# Patient Record
Sex: Female | Born: 1975 | Race: White | Hispanic: No | Marital: Married | Smoking: Never smoker
Health system: Southern US, Community
[De-identification: ages and names within clinical notes are randomized; demographics above are authoritative.]

## PROBLEM LIST (undated history)

## (undated) DIAGNOSIS — E559 Vitamin D deficiency, unspecified: Secondary | ICD-10-CM

## (undated) DIAGNOSIS — D649 Anemia, unspecified: Secondary | ICD-10-CM

## (undated) DIAGNOSIS — Q7962 Hypermobile Ehlers-Danlos syndrome: Secondary | ICD-10-CM

## (undated) DIAGNOSIS — M1990 Unspecified osteoarthritis, unspecified site: Secondary | ICD-10-CM

## (undated) DIAGNOSIS — K3184 Gastroparesis: Secondary | ICD-10-CM

## (undated) DIAGNOSIS — R55 Syncope and collapse: Secondary | ICD-10-CM

## (undated) DIAGNOSIS — S82202K Unspecified fracture of shaft of left tibia, subsequent encounter for closed fracture with nonunion: Secondary | ICD-10-CM

## (undated) DIAGNOSIS — G43909 Migraine, unspecified, not intractable, without status migrainosus: Secondary | ICD-10-CM

## (undated) HISTORY — PX: ESOPHAGOGASTRODUODENOSCOPY ENDOSCOPY: SHX5814

## (undated) HISTORY — PX: CHOLECYSTECTOMY: SHX55

## (undated) HISTORY — PX: KNEE SURGERY: SHX244

## (undated) HISTORY — PX: TONSILLECTOMY: SUR1361

## (undated) HISTORY — PX: SHOULDER SURGERY: SHX246

## (undated) HISTORY — PX: MAXILLARY ANTROSTOMY: SHX2003

---

## 2001-09-16 ENCOUNTER — Ambulatory Visit (HOSPITAL_COMMUNITY): Admission: RE | Admit: 2001-09-16 | Discharge: 2001-09-16 | Payer: Self-pay | Admitting: *Deleted

## 2005-03-02 ENCOUNTER — Other Ambulatory Visit: Admission: RE | Admit: 2005-03-02 | Discharge: 2005-03-02 | Payer: Self-pay | Admitting: Obstetrics and Gynecology

## 2005-05-01 ENCOUNTER — Encounter: Admission: RE | Admit: 2005-05-01 | Discharge: 2005-05-01 | Payer: Self-pay | Admitting: Orthopedic Surgery

## 2007-02-26 ENCOUNTER — Encounter: Admission: RE | Admit: 2007-02-26 | Discharge: 2007-02-26 | Payer: Self-pay | Admitting: Emergency Medicine

## 2007-03-01 ENCOUNTER — Ambulatory Visit: Payer: Self-pay | Admitting: Pulmonary Disease

## 2007-07-18 ENCOUNTER — Encounter: Admission: RE | Admit: 2007-07-18 | Discharge: 2007-07-18 | Payer: Self-pay | Admitting: Orthopedic Surgery

## 2007-09-24 ENCOUNTER — Ambulatory Visit: Payer: Self-pay | Admitting: Pulmonary Disease

## 2007-09-24 DIAGNOSIS — G43909 Migraine, unspecified, not intractable, without status migrainosus: Secondary | ICD-10-CM | POA: Insufficient documentation

## 2007-09-24 DIAGNOSIS — R0602 Shortness of breath: Secondary | ICD-10-CM | POA: Insufficient documentation

## 2007-09-24 DIAGNOSIS — J383 Other diseases of vocal cords: Secondary | ICD-10-CM | POA: Insufficient documentation

## 2007-09-24 DIAGNOSIS — F410 Panic disorder [episodic paroxysmal anxiety] without agoraphobia: Secondary | ICD-10-CM | POA: Insufficient documentation

## 2007-09-25 ENCOUNTER — Telehealth (INDEPENDENT_AMBULATORY_CARE_PROVIDER_SITE_OTHER): Payer: Self-pay | Admitting: *Deleted

## 2010-04-26 NOTE — Assessment & Plan Note (Signed)
Summary: asthma/apc      Visit Type:  Initial Consult Referred by:  Dr. Laurine Blazer @ [facility]  Chief Complaint:  ? asthma; shortness of breath .  History of Present Illness: Referred for evaluation of 'asthma' - she reports dyspnea that started 6/09 while walking back from court. This primarily manifests as inability to take a deep breath. It occurs on humid days & is improved by changing her environment (into [facility]) or drinking either very hot or very cold liquids. She describes panic attacks once /yr for the last 10 yrs that cause dyspnea & tremors but her current symptoms are different. She denies any symptoms of heartburn or worsening with certain foods.There is no childhood history of asthma or persistent wheezing. h/o post bronchitic cough 12/08   I do note from her medical record that she had a chronic cough, that lasted from 05/2001 to 07/2001 when she was evaluated by Dr Sherene Sires. Drixoral was tried then. A sinus CT scan was negative in 05/2001.She was aggressively treated for laryngopharyngeal reflux. On spirometry, FEV1 was 3.86 and a methacholine challenge test in 08/2001 did not identify any broncho spastic lung disease. She did not tolerate placement of a 24 hour esophageal pH probe. Angela Harrison reports that cough died down spontaneously after a while. Allergy testing demonstrated hypersensitivity to mold and weed.      Updated Prior Medication List: SKELAXIN 800 MG  TABS (METAXALONE) One tablet every 6 hours as needed VICODIN 5-500 MG  TABS (HYDROCODONE-ACETAMINOPHEN) One tablet every 4-6 hours as needed TREXIMET 85-500 MG  TABS (SUMATRIPTAN-NAPROXEN SODIUM) Use as directed for migraines.  Current Allergies: ! ANCEF ! KEFLEX ! CEPHALEXIN  Past Medical History:    migraines    panic attacks  Past Surgical History:    4 kneee surgeries    3 L shoulder surgeries    jaw surgery - upper jaw surgery - to correct bite.    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  The patient denies anorexia, fever, weight loss, weight gain, vision loss, decreased hearing, hoarseness, chest pain, syncope, peripheral edema, prolonged cough, headaches, hemoptysis, abdominal pain, melena, hematochezia, severe indigestion/heartburn, hematuria, incontinence, genital sores, muscle weakness, suspicious skin lesions, transient blindness, difficulty walking, depression, unusual weight change, abnormal bleeding, enlarged lymph nodes, angioedema, breast masses, and testicular masses.     Vital Signs:  Patient Profile:   35 Years Old Female Height:     70 inches Weight:      196.50 pounds O2 Sat:      98 % O2 treatment:    Room Air Temp:     98.1 degrees F oral Pulse rate:   88 / minute BP sitting:   104 / 78  (left arm) Cuff size:   regular  Vitals Entered By: Marinus Maw (September 24, 2007 2:28 PM)             Comments Medications reviewed with patient Marinus Maw  September 24, 2007 2:36 PM      Physical Exam  General:     HEENT - no thrush, no post nasal drip No JVD, no lymphadenopathy CVS- s1s2 nml, no murmur RS- clear, no crackles or rhonchi Abd- soft, non-tender, no organomegaly CNS- non focal Ext - no edema  Nose:     clear nasal discharge.      Pulmonary Function Test Height (in.): 70 Gender: Female    Impression & Recommendations:  Problem # 1:  DYSPNEA (ICD-786.05) Spirometry >> nml lung function. Doubt asthma here, feel symptoms may be attributable to vocal  cord dysfunction. DD incl allergic diathesis (this seems to have been tested last yr) & post nasal drip causing intermittent cough. She will use proventil on a prn  basis . Given reading material for VCD- consider speech therapy referral if symptoms worsen. 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 Orders: Consultation Level III (72536) Spirometry w/Graph (94010)   Medications Added to Medication List This Visit: 1)  Skelaxin 800 Mg Tabs (Metaxalone) .... One tablet every 6 hours as needed 2)  Vicodin 5-500 Mg Tabs (Hydrocodone-acetaminophen) .... One tablet every 4-6 hours as needed 3)  Treximet 85-500 Mg Tabs (Sumatriptan-naproxen sodium) .... Use as directed for migraines.   Patient Instructions: 1)  Please schedule a follow-up appointment in 3 months. 2)  Your breathing test was normal. 3)  Some reading material about vocal cord dysfunction is provided - please go over the relaxation techniques & abdominal breathing  described. 4)  Use albuterol inhaler as needed   ]

## 2010-04-26 NOTE — Progress Notes (Signed)
----   Converted from flag ---- ---- 09/24/2007 8:27 PM, [place] Locket. Vassie Loll MD wrote: to dr Laurine Blazer. ------------------------------  Faxed to Dr. Laurine Blazer

## 2010-08-09 NOTE — Assessment & Plan Note (Signed)
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 About four weeks ago, she developed low  grade fevers, fatigue, and post-nasal drip. This was followed by a dry  hacking cough. She initially received Levaquin, a Z-Pak, and prednisone  tablets. She is now on Avelox and a course of Medrol that she started  three days ago. She received treatment with Duo-Nebs in the doctor's  office, but could not tell any difference. She states that her cough is  unchanged. It is worse at night when she is laying flat. There are no  specific precipitating or relieving factors. A chest x-ray on 02/26/2007  did not show any infiltrates or effusions.   She denies any symptoms of heartburn or worsening with certain foods.  There is no childhood history of asthma or persistent wheezing. I do  note from her medical record that she had similar symptoms, a similar  cough, that lasted from 05/2001 to 07/2001 when she was evaluated by Dr.  Sherene Sires. Drixoral was tried then. A sinus CT scan was negative in 05/2001.  She was aggressively treated for laryngopharyngeal reflux. On  spirometry, FEV1 was 3.86 and a methacholine challenge test in 08/2001  did not identify any broncho spastic lung disease. She did not tolerate  placement of a 24 hour esophageal pH probe. Alvie reports that cough  died down spontaneously after a while. Allergy testing demonstrated  hypersensitivity to mold and weed.   PAST MEDICAL HISTORY:  Chronic migraine headaches.   PAST SURGICAL HISTORY:  Four knee surgeries, shoulder surgeries for  dislocation, jaw surgeries.   ALLERGIES:  CEPHALOSPORINS.   CURRENT MEDICATIONS:  1. Tussionex nightly.  2. Imitrex p.r.n.  3. Skelaxin 800 mg t.i.d. p.r.n.  4. Vicodin 5/500 mg p.r.n.   SOCIAL HISTORY:  Never smoker. She drinks 1 to 2 glasses of wine per  week. She is single and has had no sick contacts. 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 She recently traveled to [place], [place]  and [place].   FAMILY HISTORY:  Father has high blood pressure. Mother and paternal  grandmother have breast cancer.   REVIEW OF SYSTEMS:  Non-productive cough followed by chest pain, right  earache, scant white phlegm that is occasionally yellow.   PHYSICAL EXAMINATION:  VITAL SIGNS:  Weight 200 pounds, temperature  98.1, blood pressure 122/76, heart rate 86 per minute, oxygen saturation  97% on room air.  HEENT:  Injected posterior pharyngeal wall. No exudates.  CARDIOVASCULAR:  S1, S2, normal.  CHEST:  Clear to auscultation. No rhonchi.  ABDOMEN:  Soft and nontender.  EXTREMITIES:  No edema.   IMPRESSION:  I feel that Aradhana most likely has a post-bronchitic  cough. I have reassured her and explained to her that this can last for  as  long as 8 to 12 weeks. Based on her prior such episode in 2003, I  would not be surprised if this even lasts longer and I think that our  objective should be to provide her symptomatic relief rather than pursue  further testing. I do note that aggressive therapy for reflux in the  past has not been of any benefit. I doubt asthma, especially in the  light of a negative methacholine challenge test in the past.   RECOMMENDATIONS:  1. For post-bronchitic cough, inhaled steroids have shown to be of      some benefit. Accordingly, I gave her a sample of Qvar 80 mcg MDI      and I asked her to take two puffs twice daily. In the absence of      bronchospasm, I did not feel that bronchodilator is necessary at      this time.  2. I also gave her a prescription for Atrovent because this has been      shown to be of some benefit, probably because of the      anticholinergic  effects. She will fill this prescription only if      she is symptomatic unimproved for a couple of weeks.  3. She will continue to use the Tussionex at night for symptomatic      relief and complete the course of prednisone that she is currently      on. If she is not improved, she will come for a follow up visit      again in about 4 to 6 weeks time.     Oretha Milch, MD  Electronically Signed    RVA/MedQ  DD: 03/01/2007  DT: 03/02/2007  Job #: [id number]   cc:   Oley Balm. Georgina Pillion, M.D.

## 2010-12-08 ENCOUNTER — Other Ambulatory Visit: Payer: Self-pay | Admitting: Gastroenterology

## 2010-12-08 DIAGNOSIS — R1013 Epigastric pain: Secondary | ICD-10-CM

## 2010-12-14 ENCOUNTER — Ambulatory Visit
Admission: RE | Admit: 2010-12-14 | Discharge: 2010-12-14 | Disposition: A | Discharge status: Home / Self Care | Payer: BC Managed Care – PPO | Source: Ambulatory Visit | Attending: Gastroenterology | Admitting: Gastroenterology

## 2010-12-14 DIAGNOSIS — R1013 Epigastric pain: Secondary | ICD-10-CM

## 2011-01-10 ENCOUNTER — Other Ambulatory Visit (HOSPITAL_COMMUNITY): Payer: Self-pay | Admitting: Gastroenterology

## 2011-01-12 ENCOUNTER — Encounter (HOSPITAL_COMMUNITY)
Admission: RE | Admit: 2011-01-12 | Discharge: 2011-01-12 | Disposition: A | Discharge status: Home / Self Care | Payer: BC Managed Care – PPO | Source: Ambulatory Visit | Attending: Gastroenterology | Admitting: Gastroenterology

## 2011-01-12 DIAGNOSIS — R112 Nausea with vomiting, unspecified: Secondary | ICD-10-CM | POA: Insufficient documentation

## 2011-01-12 MED ORDER — TECHNETIUM TC 99M SULFUR COLLOID
2.0000 | Freq: Once | INTRAVENOUS | Status: AC | PRN
  Administered 2011-01-12: 2 via ORAL

## 2011-05-25 ENCOUNTER — Emergency Department (HOSPITAL_COMMUNITY)
Admission: EM | Admit: 2011-05-25 | Discharge: 2011-05-26 | Disposition: A | Discharge status: Home Health | Payer: BC Managed Care – PPO | Attending: Emergency Medicine | Admitting: Emergency Medicine

## 2011-05-25 ENCOUNTER — Encounter (HOSPITAL_COMMUNITY): Payer: Self-pay | Admitting: Emergency Medicine

## 2011-05-25 DIAGNOSIS — R55 Syncope and collapse: Secondary | ICD-10-CM | POA: Insufficient documentation

## 2011-05-25 DIAGNOSIS — R42 Dizziness and giddiness: Secondary | ICD-10-CM | POA: Insufficient documentation

## 2011-05-25 DIAGNOSIS — E860 Dehydration: Secondary | ICD-10-CM | POA: Insufficient documentation

## 2011-05-25 DIAGNOSIS — R112 Nausea with vomiting, unspecified: Secondary | ICD-10-CM | POA: Insufficient documentation

## 2011-05-25 HISTORY — DX: Gastroparesis: K31.84

## 2011-05-25 NOTE — ED Notes (Signed)
Pt has had vomitng and seen at [facility] since August for the same complaints. Pt sts her sx are the same and she has vomited several times today.

## 2011-05-26 LAB — BASIC METABOLIC PANEL
BUN: 6 mg/dL (ref 6–23)
CO2: 26 mEq/L (ref 19–32)
Calcium: 9.4 mg/dL (ref 8.4–10.5)
Chloride: 101 mEq/L (ref 96–112)
Creatinine, Ser: 0.68 mg/dL (ref 0.50–1.10)
GFR calc Af Amer: >90 mL/min (ref >90)
GFR calc non Af Amer: >90 mL/min (ref >90)
Glucose, Bld: 84 mg/dL (ref 70–99)
Potassium: 3.8 mEq/L (ref 3.5–5.1)
Sodium: 138 mEq/L (ref 135–145)

## 2011-05-26 LAB — URINALYSIS, ROUTINE W REFLEX MICROSCOPIC
Bilirubin Urine: NEGATIVE
Glucose, UA: NEGATIVE mg/dL
Hgb urine dipstick: NEGATIVE
Ketones, ur: NEGATIVE mg/dL
Leukocytes, UA: NEGATIVE
Nitrite: NEGATIVE
Protein, ur: NEGATIVE mg/dL
Specific Gravity, Urine: 1.006 (ref 1.005–1.030)
Urobilinogen, UA: 0.2 mg/dL (ref 0.0–1.0)
pH: 6 (ref 5.0–8.0)

## 2011-05-26 LAB — CBC
HCT: 34 % — ABNORMAL LOW (ref 36.0–46.0)
Hemoglobin: 11.4 g/dL — ABNORMAL LOW (ref 12.0–15.0)
MCH: 26.8 pg (ref 26.0–34.0)
MCHC: 33.5 g/dL (ref 30.0–36.0)
MCV: 80 fL (ref 78.0–100.0)
Platelets: 324 10*3/uL (ref 150–400)
RBC: 4.25 MIL/uL (ref 3.87–5.11)
RDW: 14.2 % (ref 11.5–15.5)
WBC: 8 10*3/uL (ref 4.0–10.5)

## 2011-05-26 MED ORDER — PROMETHAZINE HCL 25 MG RE SUPP: 25.0000 mg | Freq: Four times a day (QID) | RECTAL | Status: DC | PRN

## 2011-05-26 MED ORDER — ONDANSETRON HCL 4 MG/2ML IJ SOLN
4.0000 mg | Freq: Once | INTRAMUSCULAR | Status: AC
  Administered 2011-05-26: 4 mg via INTRAVENOUS
  Filled 2011-05-26: qty 2

## 2011-05-26 MED ORDER — PROMETHAZINE HCL 25 MG PO TABS: 25.0000 mg | ORAL_TABLET | Freq: Four times a day (QID) | ORAL | Status: DC | PRN

## 2011-05-26 MED ORDER — SODIUM CHLORIDE 0.9 % IV BOLUS (SEPSIS)
1000.0000 mL | Freq: Once | INTRAVENOUS | Status: AC
  Administered 2011-05-26: 1000 mL via INTRAVENOUS

## 2011-05-26 NOTE — Discharge Instructions (Signed)
Return to the emergency department for severe or worsening symptoms, your blood work is normal in her symptoms have improved significantly with intravenous medications. Take Phenergan by mouth or by rectum should her symptoms return.  Dehydration Dehydration is the reduction of water and fluid from the body to a level below that required for proper functioning. CAUSES  Dehydration occurs when there is excessive fluid loss from the body or when loss of normal fluids is not adequately replaced.  Loss of fluids occurs in vomiting, diarrhea, excessive sweating, excessive urine output, or excessive loss of fluid from the lungs (as occurs in fever or in patients on a ventilator).   Inadequate fluid replacement occurs with nausea or decreased appetite due to illness, sore throat, or mouth pain.  SYMPTOMS  Mild dehydration  Thirst (infants and young children may not be able to tell you they are thirsty).   Dry lips.   Slightly dry mouth membranes.  Moderate dehydration  Very dry mouth membranes.   Sunken eyes.   Sunken soft spot (fontanelle) on infant's head.   Skin does not bounce back quickly when lightly pinched and released.   Decreased urine production.   Decreased tear production.  Severe dehydration  Rapid, weak pulse (more than 100 beats per minute at rest).   Cold hands and feet.   Loss of ability to sweat in spite of heat and temperature.   Rapid breathing.   Blue lips.   Confusion, lethargy, difficult to arouse.   Minimal urine production.   No tears.  DIAGNOSIS  Your caregiver will diagnose dehydration based on your symptoms and your exam. Blood and urine tests will help confirm the diagnosis. The diagnostic evaluation should also identify the cause of dehydration. PREVENTION  The body depends on a proper balance of fluid and salts (electrolytes) for normal function. Adequate fluid intake in the presence of illness or other stresses (such as extreme exercise) is  important.  TREATMENT   Mild dehydration is safe to self-treat for most ages as long as it does not worsen. Contact your caregiver for even mild dehydration in infants and the elderly.   In teenagers and adults with moderate dehydration, careful home treatment (as outlined below) can be safe. Phone contact with a caregiver is advised. Children under 44 years of age with moderate dehydration should see a caregiver.   If you or your child is severely dehydrated, go to a hospital for treatment. Intravenous (IV) fluids will quickly reverse dehydration and are often lifesaving in young children, infants, and elderly persons.  HOME CARE INSTRUCTIONS  Small amounts of fluids should be taken frequently. Large amounts at one time may not be tolerated. Plain water may be harmful in infants and the elderly. Oral rehydration solutions (ORS) are available at pharmacies and grocery stores. ORS replaces water and important electrolytes in proper proportions. Sports drinks are not as effective as ORS and may be harmful because the sugar can make diarrhea worse.  As a general guideline for children, replace any new fluid losses from diarrhea and/or vomiting with ORS as follows:   If your child weighs 22 pounds or under (10 kg or less), give 60-120 mL (1/4-1/2 cup or 2-4 ounces) of ORS for each diarrheal stool or vomiting episode.   If your child weighs more than 22 pounds (more than 10 kg), give 120-240 mL (1/2-1 cup or 4-8 ounces) of ORS for each diarrheal stool or vomiting episode.   If your child is vomiting, it may be helpful  to give the above ORS replacement in 5 mL (1 teaspoon) amounts every 5 minutes and increase as tolerated.   While correcting for dehydration, children should eat normally. However, foods high in sugar should be avoided because they may worsen diarrhea. Large amounts of carbonated soft drinks, juice, gelatin desserts, and other highly sugared drinks should be avoided.   After correction  of dehydration, other liquids that are appealing to the child may be added. Children should drink small amounts of fluids frequently and fluids should be increased as tolerated. Children should drink enough fluids to keep urine clear or pale yellow.   Adults should eat normally while drinking more fluids than usual. Drink small amounts of fluids frequently and increase the amount as tolerated. Drink enough fluids to keep urine clear or pale yellow. Broths, weak decaffeinated tea, lemon-lime soft drinks (allowed to go flat), and ORS replace fluids and electrolytes.   Avoid:   Carbonated drinks.   Juice.   Extremely hot or cold fluids.   Caffeine drinks.   Fatty, greasy foods.   Alcohol.   Tobacco.   Too much intake of anything at one time.   Gelatin desserts.   Probiotics are active cultures of beneficial bacteria. They may lessen the amount and number of diarrheal stools in adults. Probiotics can be found in yogurt with active cultures and in supplements.   Wash your hands well to avoid spreading germs (bacteria) and viruses.   Antidiarrheal medicines are not recommended for infants and children.   Only take over-the-counter or prescription medicines for pain, discomfort, or fever as directed by your caregiver. Do not give aspirin to children.   For adults with dehydration, ask your caregiver if you should continue all prescribed and over-the-counter medicines.   If your caregiver has given you a follow-up appointment, it is very important to keep that appointment. Not keeping the appointment could result in a lasting (chronic) or permanent injury and disability. If there is any problem keeping the appointment, you must call to reschedule.  SEEK IMMEDIATE MEDICAL CARE IF:   You are unable to keep fluids down or other symptoms become worse despite treatment.   Vomiting or diarrhea develops and becomes persistent.   There is vomiting of blood or green matter (bile).   There  is blood in the stool or the stools are black and tarry.   There is no urine output in 6 to 8 hours or there is only a small amount of very dark urine.   Abdominal pain develops, increases, or localizes.   You or your child has an oral temperature above 102 F (38.9 C), not controlled by medicine.   Your baby is older than 3 months with a rectal temperature of 102.10F (38.9 C) or higher.   Your baby is 70 months old or younger with a rectal temperature of 100.4 F (38 C) or higher.   You develop excessive weakness, dizziness, fainting, or extreme thirst.   You develop a rash, stiff neck, severe headache, or you become irritable, sleepy, or difficult to awaken.  MAKE SURE YOU:   Understand these instructions.   Will watch your condition.   Will get help right away if you are not doing well or get worse.  Document Released: 03/13/2005 Document Revised: 09/26/2010 Document Reviewed: 02/09/2009 [facility] Patient Information 2012 [place], [place].

## 2011-05-26 NOTE — ED Provider Notes (Signed)
History     CSN: [id number]  Arrival date & time 05/25/11  2156   First MD Initiated Contact with Patient 05/25/11 2358      Chief Complaint  Patient presents with  . Dehydration    (Consider location/radiation/quality/duration/timing/severity/associated sxs/prior treatment) HPI Comments: 36 year old female with a history of recent diagnosis of unexplained gastroparesis. She presents because she's been having several days of worsening nausea and vomiting. This is persistent, nothing makes it better or worse, it is not associated with abdominal pain, chest pain, shortness of breath or fevers. She denies changes in bowel habits, diarrhea, rectal bleeding, dysuria. In the last several days she finished her menstrual cycle. The nausea and vomiting is persistent throughout the day, she has had half of a bagel and one piece of toast and vomited both promptly. She has been able to tolerate some fluids but has noticed that her urine has become gradually darker as the day has gone on. In the last several days she has recently started taking metoclopramide for nausea. Her workup has been performed at [facility] stay in [place]. She has not had any surgical procedures on her abdomen  Prior to arrival, the patient noted that from standing she became dizzy lightheaded and had a near syncopal episode in her vision went black. This resolved after 30 seconds and improved with sitting down.  The history is provided by the patient.    Past Medical History  Diagnosis Date  . Gastric paresis     No past surgical history on file.  No family history on file.  History  Substance Use Topics  . Smoking status: Not on file  . Smokeless tobacco: Not on file  . Alcohol Use: No    OB History    Grav Para Term Preterm Abortions TAB SAB Ect Mult Living                  Review of Systems  All other systems reviewed and are negative.    Allergies  Cefazolin and Cephalexin  Home  Medications   Current Outpatient Rx  Name Route Sig Dispense Refill  . HYDROCODONE-ACETAMINOPHEN 5-500 MG PO TABS Oral Take 1 tablet by mouth every 8 (eight) hours as needed. For pain    . METOCLOPRAMIDE HCL 10 MG PO TABS Oral Take 10 mg by mouth 3 (three) times daily before meals. For 10 days; started 05/22/11    . OMEPRAZOLE 40 MG PO CPDR Oral Take 40 mg by mouth 2 (two) times daily before a meal.    . SUCRALFATE 1 G PO TABS Oral Take 1 g by mouth 4 (four) times daily -  before meals and at bedtime.    Marland Kitchen PROMETHAZINE HCL 25 MG RE SUPP Rectal Place 1 suppository (25 mg total) rectally every 6 (six) hours as needed for nausea. 12 each 0  . PROMETHAZINE HCL 25 MG PO TABS Oral Take 1 tablet (25 mg total) by mouth every 6 (six) hours as needed for nausea. 12 tablet 0    BP 119/87  Pulse 69  Temp(Src) 97.9 F (36.6 C) (Oral)  Resp 16  SpO2 100%  Physical Exam  Nursing note and vitals reviewed. Constitutional: She appears well-developed and well-nourished. No distress.  HENT:  Head: Normocephalic and atraumatic.  Mouth/Throat: No oropharyngeal exudate.       Mucous membranes mildly dehydrated  Eyes: Conjunctivae and EOM are normal. Pupils are equal, round, and reactive to light. Right eye exhibits no discharge. Left eye exhibits  no discharge. No scleral icterus.  Neck: Normal range of motion. Neck supple. No JVD present. No thyromegaly present.  Cardiovascular: Normal rate, regular rhythm, normal heart sounds and intact distal pulses.  Exam reveals no gallop and no friction rub.   No murmur heard. Pulmonary/Chest: Effort normal and breath sounds normal. No respiratory distress. She has no wheezes. She has no rales.  Abdominal: Soft. Bowel sounds are normal. She exhibits no distension and no mass. There is no tenderness.  Musculoskeletal: Normal range of motion. She exhibits no edema and no tenderness.  Lymphadenopathy:    She has no cervical adenopathy.  Neurological: She is alert.  Coordination normal.  Skin: Skin is warm and dry. No rash noted. No erythema.  Psychiatric: She has a normal mood and affect. Her behavior is normal.    ED Course  Procedures (including critical care time)  Labs Reviewed  CBC - Abnormal; Notable for the following:    Hemoglobin 11.4 (*)    HCT 34.0 (*)    All other components within normal limits  URINALYSIS, ROUTINE W REFLEX MICROSCOPIC  BASIC METABOLIC PANEL   No results found.   1. Nausea and vomiting   2. Dehydration       MDM  Currently patient has a normal neurologic exam, normal vital signs but has what appears to be clinical dehydration. She has known gastroparesis which is likely the cause of her symptoms though it seems at this time she does has not clearly elucidated as to the source of her gastroparesis. She has had a very thorough and complete workup done by [facility] including a gastric emptying study done in December which was abnormal and confirmed her diagnosis. She denies diabetes, MS, other neurologic diseases. We'll proceed with IV fluids, Zofran, check potassium, urinalysis and reevaluate. Again she has no abdominal pain or abdominal tenderness on exam  Procedure Note :  Peripheral IV placement  I have personally placed an 20  gauge IV angiocath in the L hand  blood draw and saline flush successful and sterile dressing applied.  Pt tolerated without complaint or complication    Patient is significantly improved after IV fluids and intravenous Zofran. Evaluation the results shows urinalysis without any signs of severe dehydration and no white abnormalities as well as no renal dysfunction. CBC within normal limits, patient instructed to return if symptoms should worsen, Phenergan for home use.  Vida Roller, MD 05/26/11 [phone]

## 2011-05-26 NOTE — ED Notes (Signed)
Patient complaining of nausea, vomiting, and lightheadedness.  Patient states that she has had a chronic issue with nausea/vomiting (since August) and that she is seeing a GI Clinic at [facility].  Patient states that she "saw blackness" around 1930, but denies a syncopal episode.  Upon arrival to room, patient changed into gown.  Patient alert and oriented x4; PERRL present.

## 2011-05-26 NOTE — ED Notes (Signed)
Registration at bedside.

## 2011-05-26 NOTE — ED Notes (Signed)
Pt states she feels SO much better. States she feels she can go home.  Ambulated with no dizziness or nausea.  VS stable.

## 2011-05-26 NOTE — ED Notes (Signed)
Patient currently resting quietly in bed; no respiratory or acute distress noted.  Patient updated on plan of care; informed patient that we need a urine sample.  Patient states that she is unable to go at this time; will continue to monitor.

## 2011-05-26 NOTE — ED Notes (Signed)
EDP at bedside  

## 2012-04-18 ENCOUNTER — Encounter (HOSPITAL_COMMUNITY): Payer: Self-pay | Admitting: *Deleted

## 2012-04-18 ENCOUNTER — Emergency Department (HOSPITAL_COMMUNITY)
Admission: EM | Admit: 2012-04-18 | Discharge: 2012-04-19 | Disposition: A | Discharge status: Home / Self Care | Payer: BC Managed Care – PPO | Attending: Emergency Medicine | Admitting: Emergency Medicine

## 2012-04-18 DIAGNOSIS — E860 Dehydration: Secondary | ICD-10-CM | POA: Insufficient documentation

## 2012-04-18 DIAGNOSIS — R42 Dizziness and giddiness: Secondary | ICD-10-CM | POA: Insufficient documentation

## 2012-04-18 DIAGNOSIS — Z3202 Encounter for pregnancy test, result negative: Secondary | ICD-10-CM | POA: Insufficient documentation

## 2012-04-18 DIAGNOSIS — R112 Nausea with vomiting, unspecified: Secondary | ICD-10-CM | POA: Insufficient documentation

## 2012-04-18 DIAGNOSIS — Z79899 Other long term (current) drug therapy: Secondary | ICD-10-CM | POA: Insufficient documentation

## 2012-04-18 DIAGNOSIS — K3184 Gastroparesis: Secondary | ICD-10-CM | POA: Insufficient documentation

## 2012-04-18 LAB — CBC WITH DIFFERENTIAL/PLATELET
Basophils Absolute: 0 10*3/uL (ref 0.0–0.1)
Basophils Relative: 1 % (ref 0–1)
Eosinophils Absolute: 0.1 10*3/uL (ref 0.0–0.7)
Eosinophils Relative: 1 % (ref 0–5)
HCT: 39.7 % (ref 36.0–46.0)
Hemoglobin: 14 g/dL (ref 12.0–15.0)
Lymphocytes Relative: 31 % (ref 12–46)
Lymphs Abs: 2.2 10*3/uL (ref 0.7–4.0)
MCH: 29.4 pg (ref 26.0–34.0)
MCHC: 35.3 g/dL (ref 30.0–36.0)
MCV: 83.4 fL (ref 78.0–100.0)
Monocytes Absolute: 0.7 10*3/uL (ref 0.1–1.0)
Monocytes Relative: 10 % (ref 3–12)
Neutro Abs: 4.2 10*3/uL (ref 1.7–7.7)
Neutrophils Relative %: 58 % (ref 43–77)
Platelets: 310 10*3/uL (ref 150–400)
RBC: 4.76 MIL/uL (ref 3.87–5.11)
RDW: 13.3 % (ref 11.5–15.5)
WBC: 7.2 10*3/uL (ref 4.0–10.5)

## 2012-04-18 LAB — URINALYSIS, ROUTINE W REFLEX MICROSCOPIC
Bilirubin Urine: NEGATIVE
Glucose, UA: NEGATIVE mg/dL
Hgb urine dipstick: NEGATIVE
Ketones, ur: NEGATIVE mg/dL
Leukocytes, UA: NEGATIVE
Nitrite: NEGATIVE
Protein, ur: NEGATIVE mg/dL
Specific Gravity, Urine: 1.023 (ref 1.005–1.030)
Urobilinogen, UA: 0.2 mg/dL (ref 0.0–1.0)
pH: 5.5 (ref 5.0–8.0)

## 2012-04-18 LAB — COMPREHENSIVE METABOLIC PANEL
ALT: 11 U/L (ref 0–35)
AST: 13 U/L (ref 0–37)
Albumin: 3.8 g/dL (ref 3.5–5.2)
Alkaline Phosphatase: 67 U/L (ref 39–117)
BUN: 11 mg/dL (ref 6–23)
CO2: 29 mEq/L (ref 19–32)
Calcium: 10.1 mg/dL (ref 8.4–10.5)
Chloride: 101 mEq/L (ref 96–112)
Creatinine, Ser: 0.81 mg/dL (ref 0.50–1.10)
GFR calc Af Amer: >90 mL/min (ref >90)
GFR calc non Af Amer: >90 mL/min (ref >90)
Glucose, Bld: 103 mg/dL — ABNORMAL HIGH (ref 70–99)
Potassium: 4.5 mEq/L (ref 3.5–5.1)
Sodium: 138 mEq/L (ref 135–145)
Total Bilirubin: 0.2 mg/dL — ABNORMAL LOW (ref 0.3–1.2)
Total Protein: 7.6 g/dL (ref 6.0–8.3)

## 2012-04-18 LAB — PREGNANCY, URINE: Preg Test, Ur: NEGATIVE

## 2012-04-18 MED ORDER — SODIUM CHLORIDE 0.9 % IV SOLN
1000.0000 mL | Freq: Once | INTRAVENOUS | Status: AC
  Administered 2012-04-18: 1000 mL via INTRAVENOUS

## 2012-04-18 MED ORDER — ONDANSETRON HCL 4 MG/2ML IJ SOLN
4.0000 mg | Freq: Once | INTRAMUSCULAR | Status: AC
  Administered 2012-04-18: 4 mg via INTRAVENOUS
  Filled 2012-04-18: qty 2

## 2012-04-18 MED ORDER — SODIUM CHLORIDE 0.9 % IV SOLN: 1000.0000 mL | INTRAVENOUS | Status: DC

## 2012-04-18 NOTE — ED Notes (Signed)
Pt states she took compazene for nausea but she is unable to keep it down.

## 2012-04-18 NOTE — ED Notes (Signed)
Pt states her bilateral leg cramps began mid afternoon, she has been unable to keep any fluids or food down. Pt states she has been having stomach issues for a year and a half. Pt states she is being seen by gastroenterologist at [facility].

## 2012-04-18 NOTE — ED Notes (Signed)
No abd pain 

## 2012-04-18 NOTE — ED Notes (Signed)
The pt has had nv and leg pain  For 7 days no diarrhea.  She has a history of the same.  She is also c/o dizziness.  Unable to hold any liquids down and her legs have been cramping.  lmp jan 10

## 2012-04-18 NOTE — ED Provider Notes (Signed)
History     CSN: [id number]  Arrival date & time 04/18/12  1910   First MD Initiated Contact with Patient 04/18/12 2249      Chief Complaint  Patient presents with  . Emesis    (Consider location/radiation/quality/duration/timing/severity/associated sxs/prior treatment) The history is provided by the patient, the spouse and medical records.    Angela Harrison is a 37 y.o. female  with a hx of gastroparesis without known etiology presents to the Emergency Department complaining of gradual, persistent, progressively worsening nausea and vomiting onset 4 days ago.  Pt has attempted to eat toast, part of a bagel and juice this morning with vomiting after each attempt.  She has a Hx of intermittent episodes of nausea and vomiting for the past year with extensive work ups at both [facility] and [facility].  She has been taking Zofran without relief and she attempted compazine which she promptly vomited.  Associated symptoms include nausea, vomiting, leg cramps and lightheadedness.  The symptoms today are no different than before, but she states she feels dehydrated.  Nothing makes it better and nothing makes it worse.  Pt denies abdominal pain, chest pain, shortness of breath, fevers, changes in bowel habits, diarrhea, rectal bleeding, dysuria.  She has no Hx of abdominal surgical procedures.     Past Medical History  Diagnosis Date  . Gastric paresis     History reviewed. No pertinent past surgical history.  No family history on file.  History  Substance Use Topics  . Smoking status: Not on file  . Smokeless tobacco: Not on file  . Alcohol Use: No    OB History    Grav Para Term Preterm Abortions TAB SAB Ect Mult Living                  Review of Systems  Constitutional: Negative for fever, diaphoresis, appetite change, fatigue and unexpected weight change.  HENT: Negative for mouth sores, trouble swallowing, neck pain and neck stiffness.   Respiratory: Negative  for cough, chest tightness, shortness of breath, wheezing and stridor.   Cardiovascular: Negative for chest pain and palpitations.  Gastrointestinal: Positive for nausea and vomiting. Negative for abdominal pain, diarrhea, constipation, blood in stool, abdominal distention and rectal pain.  Genitourinary: Negative for dysuria, urgency, frequency, hematuria, flank pain and difficulty urinating.  Musculoskeletal: Negative for back pain.  Skin: Negative for rash.  Neurological: Positive for dizziness. Negative for weakness.  Hematological: Negative for adenopathy.  Psychiatric/Behavioral: Negative for confusion.  All other systems reviewed and are negative.    Allergies  Cefazolin and Cephalexin  Home Medications   Current Outpatient Rx  Name  Route  Sig  Dispense  Refill  . CYCLOBENZAPRINE HCL 10 MG PO TABS   Oral   Take 10 mg by mouth 3 (three) times daily as needed. For pain         . HYDROCODONE-ACETAMINOPHEN 5-500 MG PO TABS   Oral   Take 1 tablet by mouth every 8 (eight) hours as needed. For pain         . OMEPRAZOLE 40 MG PO CPDR   Oral   Take 40 mg by mouth daily.          Marland Kitchen ONDANSETRON HCL 8 MG PO TABS   Oral   Take 8 mg by mouth every 8 (eight) hours as needed. For nausea         . PROCHLORPERAZINE MALEATE 10 MG PO TABS   Oral  Take 10 mg by mouth every 6 (six) hours as needed.         Marland Kitchen ONDANSETRON HCL 4 MG PO TABS   Oral   Take 1 tablet (4 mg total) by mouth every 6 (six) hours.   20 tablet   0     BP 117/82  Pulse 82  Temp 98.1 F (36.7 C) (Oral)  Resp 16  SpO2 100%  LMP 04/01/2012  Physical Exam  Nursing note and vitals reviewed. Constitutional: She is oriented to person, place, and time. She appears well-developed. No distress.  HENT:  Head: Normocephalic and atraumatic.  Right Ear: Tympanic membrane, external ear and ear canal normal.  Left Ear: Tympanic membrane, external ear and ear canal normal.  Nose: Nose normal. Right sinus  exhibits no maxillary sinus tenderness and no frontal sinus tenderness. Left sinus exhibits no maxillary sinus tenderness and no frontal sinus tenderness.  Mouth/Throat: Uvula is midline and oropharynx is clear and moist. Mucous membranes are dry and not cyanotic. No uvula swelling. No oropharyngeal exudate, posterior oropharyngeal edema, posterior oropharyngeal erythema or tonsillar abscesses.  Eyes: Conjunctivae normal are normal. Pupils are equal, round, and reactive to light. No scleral icterus.  Neck: Normal range of motion.  Cardiovascular: Normal rate, regular rhythm, normal heart sounds and intact distal pulses.  Exam reveals no gallop and no friction rub.   No murmur heard. Pulmonary/Chest: Effort normal and breath sounds normal. No accessory muscle usage. Not tachypneic. No respiratory distress. She has no decreased breath sounds. She has no wheezes. She has no rhonchi. She has no rales. She exhibits no tenderness.  Abdominal: Soft. Normal appearance and bowel sounds are normal. She exhibits no distension, no abdominal bruit, no pulsatile midline mass and no mass. There is no hepatosplenomegaly. There is no tenderness. There is no rigidity, no rebound, no guarding, no CVA tenderness, no tenderness at McBurney's point and negative Murphy's sign.  Musculoskeletal: Normal range of motion. She exhibits no edema and no tenderness.  Lymphadenopathy:    She has no cervical adenopathy.  Neurological: She is alert and oriented to person, place, and time. She exhibits normal muscle tone. Coordination normal.  Skin: Skin is warm and dry. No rash noted. She is not diaphoretic. No pallor.  Psychiatric: She has a normal mood and affect.    ED Course  Procedures (including critical care time)  Labs Reviewed  COMPREHENSIVE METABOLIC PANEL - Abnormal; Notable for the following:    Glucose, Bld 103 (*)     Total Bilirubin 0.2 (*)     All other components within normal limits  CBC WITH DIFFERENTIAL    URINALYSIS, ROUTINE W REFLEX MICROSCOPIC  PREGNANCY, URINE   No results found.   1. Nausea and vomiting   2. Dehydration       MDM  Angela Harrison presents with nausea and vomiting.  Pt with increased urine specific gravity but no ketones.  Will rehydrate in the ER and give zofran.  CBC, CMP unremarkable.  Preg test negative.   Pt given 3L NS, zofran and ativan with relief of the majority of her nausea.  Pt is tolerating PO >6oz and is ready for discharge.  I have recommended follow-up with [facility].  Pt is alert, oriented, NAD, nontoxic, nonseptic appearing.  She does not meet the SIRS or sepsis criteria.  Will discharge home with zofran Rx.  1. Medications: zofran, usual home medications 2. Treatment: rest, drink plenty of fluids,  3. Follow Up: Please followup with  your primary doctor for discussion of your diagnoses and further evaluation after today's visit        Dierdre Forth, PA-C 04/19/12 0210

## 2012-04-18 NOTE — ED Notes (Signed)
Pt unable to void at this time. 

## 2012-04-19 MED ORDER — LORAZEPAM 2 MG/ML IJ SOLN
1.0000 mg | Freq: Once | INTRAMUSCULAR | Status: AC
  Administered 2012-04-19: 1 mg via INTRAVENOUS
  Filled 2012-04-19: qty 1

## 2012-04-19 MED ORDER — SODIUM CHLORIDE 0.9 % IV BOLUS (SEPSIS)
1000.0000 mL | Freq: Once | INTRAVENOUS | Status: AC
  Administered 2012-04-19: 1000 mL via INTRAVENOUS

## 2012-04-19 MED ORDER — ONDANSETRON HCL 4 MG PO TABS: 4.0000 mg | ORAL_TABLET | Freq: Four times a day (QID) | ORAL | Status: DC

## 2012-04-19 MED ORDER — ONDANSETRON HCL 4 MG/2ML IJ SOLN
4.0000 mg | Freq: Once | INTRAMUSCULAR | Status: AC
  Administered 2012-04-19: 4 mg via INTRAVENOUS
  Filled 2012-04-19: qty 2

## 2012-04-19 NOTE — ED Provider Notes (Signed)
Medical screening examination/treatment/procedure(s) were performed by non-physician practitioner and as supervising physician I was immediately available for consultation/collaboration.  Olivia Mackie, MD 04/19/12 [phone]

## 2012-04-19 NOTE — ED Notes (Signed)
Pt discharged.Vital signs stable and GCS 15 

## 2012-06-21 ENCOUNTER — Encounter (HOSPITAL_COMMUNITY): Payer: Self-pay | Admitting: *Deleted

## 2012-06-21 ENCOUNTER — Emergency Department (HOSPITAL_COMMUNITY)
Admission: EM | Admit: 2012-06-21 | Discharge: 2012-06-21 | Disposition: A | Discharge status: Home / Self Care | Payer: BC Managed Care – PPO | Attending: Emergency Medicine | Admitting: Emergency Medicine

## 2012-06-21 DIAGNOSIS — Z79899 Other long term (current) drug therapy: Secondary | ICD-10-CM | POA: Insufficient documentation

## 2012-06-21 DIAGNOSIS — K3184 Gastroparesis: Secondary | ICD-10-CM | POA: Insufficient documentation

## 2012-06-21 DIAGNOSIS — E860 Dehydration: Secondary | ICD-10-CM | POA: Insufficient documentation

## 2012-06-21 DIAGNOSIS — R1110 Vomiting, unspecified: Secondary | ICD-10-CM

## 2012-06-21 LAB — BASIC METABOLIC PANEL
BUN: 7 mg/dL (ref 6–23)
CO2: 27 mEq/L (ref 19–32)
Calcium: 8.4 mg/dL (ref 8.4–10.5)
Chloride: 104 mEq/L (ref 96–112)
Creatinine, Ser: 0.65 mg/dL (ref 0.50–1.10)
GFR calc Af Amer: >90 mL/min (ref >90)
GFR calc non Af Amer: >90 mL/min (ref >90)
Glucose, Bld: 85 mg/dL (ref 70–99)
Potassium: 3.2 mEq/L — ABNORMAL LOW (ref 3.5–5.1)
Sodium: 138 mEq/L (ref 135–145)

## 2012-06-21 MED ORDER — ONDANSETRON HCL 4 MG/2ML IJ SOLN
8.0000 mg | Freq: Once | INTRAMUSCULAR | Status: AC
  Administered 2012-06-21: 8 mg via INTRAVENOUS
  Filled 2012-06-21: qty 4

## 2012-06-21 MED ORDER — SODIUM CHLORIDE 0.9 % IV BOLUS (SEPSIS)
1000.0000 mL | Freq: Once | INTRAVENOUS | Status: AC
  Administered 2012-06-21: 1000 mL via INTRAVENOUS

## 2012-06-21 MED ORDER — POTASSIUM CHLORIDE CRYS ER 20 MEQ PO TBCR
40.0000 meq | EXTENDED_RELEASE_TABLET | Freq: Once | ORAL | Status: AC
  Administered 2012-06-21: 40 meq via ORAL
  Filled 2012-06-21: qty 2

## 2012-06-21 NOTE — ED Notes (Signed)
Pt still stating she feels lightheaded when going from lying to sitting position at this time, but states it is better than before the fluid

## 2012-06-21 NOTE — ED Provider Notes (Signed)
History     CSN: [id number]  Arrival date & time 06/21/12  0865   First MD Initiated Contact with Patient 06/21/12 1925      Chief Complaint  Patient presents with  . Emesis  . Dehydration    (Consider location/radiation/quality/duration/timing/severity/associated sxs/prior treatment) HPI Comments: Patient comes to the ER for evaluation of possible dehydration. Patient reports that she has been sick for the last 18 months with intermittent episodes of vomiting and dehydration. She has been diagnosed with gastroparesis. Patient reports that she does use Zofran at home, but it did not help today. Patient has had recurrent episodes of emesis today and can hold anything down. She has not had fever. No significant abdominal pain. No diarrhea.  Patient is a 37 y.o. female presenting with vomiting.  Emesis   Past Medical History  Diagnosis Date  . Gastric paresis     History reviewed. No pertinent past surgical history.  No family history on file.  History  Substance Use Topics  . Smoking status: Not on file  . Smokeless tobacco: Not on file  . Alcohol Use: No    OB History   Grav Para Term Preterm Abortions TAB SAB Ect Mult Living                  Review of Systems  Gastrointestinal: Positive for vomiting.  All other systems reviewed and are negative.    Allergies  Cefazolin and Cephalexin  Home Medications   Current Outpatient Rx  Name  Route  Sig  Dispense  Refill  . cyclobenzaprine (FLEXERIL) 10 MG tablet   Oral   Take 10 mg by mouth 3 (three) times daily as needed. For pain         . fludrocortisone (FLORINEF) 0.1 MG tablet   Oral   Take 0.1 mg by mouth 3 (three) times daily.         Marland Kitchen HYDROcodone-acetaminophen (VICODIN) 5-500 MG per tablet   Oral   Take 1 tablet by mouth every 8 (eight) hours as needed. For pain         . misoprostol (CYTOTEC) 200 MCG tablet   Oral   Take 200 mcg by mouth 4 (four) times daily.         Marland Kitchen omeprazole  (PRILOSEC) 40 MG capsule   Oral   Take 40 mg by mouth daily.          . ondansetron (ZOFRAN) 8 MG tablet   Oral   Take 8 mg by mouth every 8 (eight) hours as needed. For nausea         . prochlorperazine (COMPAZINE) 10 MG tablet   Oral   Take 10 mg by mouth every 6 (six) hours as needed.           BP 134/78  Pulse 74  Temp(Src) 98.6 F (37 C) (Oral)  Resp 14  SpO2 100%  LMP 06/20/2012  Physical Exam  Constitutional: She is oriented to person, place, and time. She appears well-developed and well-nourished. No distress.  HENT:  Head: Normocephalic and atraumatic.  Right Ear: Hearing normal.  Nose: Nose normal.  Mouth/Throat: Oropharynx is clear and moist and mucous membranes are normal.  Eyes: Conjunctivae and EOM are normal. Pupils are equal, round, and reactive to light.  Neck: Normal range of motion. Neck supple.  Cardiovascular: Normal rate, regular rhythm, S1 normal and S2 normal.  Exam reveals no gallop and no friction rub.   No murmur heard. Pulmonary/Chest: Effort normal  and breath sounds normal. No respiratory distress. She exhibits no tenderness.  Abdominal: Soft. Normal appearance and bowel sounds are normal. There is no hepatosplenomegaly. There is no tenderness. There is no rebound, no guarding, no tenderness at McBurney's point and negative Murphy's sign. No hernia.  Musculoskeletal: Normal range of motion.  Neurological: She is alert and oriented to person, place, and time. She has normal strength. No cranial nerve deficit or sensory deficit. Coordination normal. GCS eye subscore is 4. GCS verbal subscore is 5. GCS motor subscore is 6.  Skin: Skin is warm, dry and intact. No rash noted. No cyanosis.  Psychiatric: She has a normal mood and affect. Her speech is normal and behavior is normal. Thought content normal.    ED Course  Procedures (including critical care time)  Labs Reviewed  BASIC METABOLIC PANEL - Abnormal; Notable for the following:     Potassium 3.2 (*)    All other components within normal limits  URINALYSIS, ROUTINE W REFLEX MICROSCOPIC   No results found.   Diagnosis: Vomiting secondary to gastroparesis    MDM  Patient presents to the ER with complaints of nausea and vomiting. Patient has a history of gastroparesis with recurrent vomiting and dehydration. Patient examination was fairly unremarkable. Patient feels much better after IV Zofran and a liter of saline. She is now tolerating by mouth without difficulty. Will be discharged to home.        Angela Crease, MD 06/21/12 [phone]

## 2012-06-21 NOTE — ED Notes (Signed)
Pt reports being sick x18 months, developed nausea/vomiting today, feels dehydrated

## 2012-10-04 ENCOUNTER — Encounter (HOSPITAL_COMMUNITY): Payer: Self-pay | Admitting: Emergency Medicine

## 2012-10-04 ENCOUNTER — Emergency Department (HOSPITAL_COMMUNITY)
Admission: EM | Admit: 2012-10-04 | Discharge: 2012-10-05 | Disposition: A | Discharge status: Home / Self Care | Payer: BC Managed Care – PPO | Attending: Emergency Medicine | Admitting: Emergency Medicine

## 2012-10-04 DIAGNOSIS — Z3202 Encounter for pregnancy test, result negative: Secondary | ICD-10-CM | POA: Insufficient documentation

## 2012-10-04 DIAGNOSIS — G43909 Migraine, unspecified, not intractable, without status migrainosus: Secondary | ICD-10-CM | POA: Insufficient documentation

## 2012-10-04 DIAGNOSIS — Z79899 Other long term (current) drug therapy: Secondary | ICD-10-CM | POA: Insufficient documentation

## 2012-10-04 DIAGNOSIS — R1110 Vomiting, unspecified: Secondary | ICD-10-CM | POA: Insufficient documentation

## 2012-10-04 DIAGNOSIS — R197 Diarrhea, unspecified: Secondary | ICD-10-CM | POA: Insufficient documentation

## 2012-10-04 HISTORY — DX: Migraine, unspecified, not intractable, without status migrainosus: G43.909

## 2012-10-04 LAB — LIPASE, BLOOD: Lipase: 26 U/L (ref 11–59)

## 2012-10-04 LAB — CBC WITH DIFFERENTIAL/PLATELET
Basophils Absolute: 0 10*3/uL (ref 0.0–0.1)
Basophils Relative: 1 % (ref 0–1)
Eosinophils Absolute: 0 10*3/uL (ref 0.0–0.7)
Eosinophils Relative: 0 % (ref 0–5)
HCT: 38.3 % (ref 36.0–46.0)
Hemoglobin: 13.3 g/dL (ref 12.0–15.0)
Lymphocytes Relative: 25 % (ref 12–46)
Lymphs Abs: 1.9 10*3/uL (ref 0.7–4.0)
MCH: 28.1 pg (ref 26.0–34.0)
MCHC: 34.7 g/dL (ref 30.0–36.0)
MCV: 81 fL (ref 78.0–100.0)
Monocytes Absolute: 1 10*3/uL (ref 0.1–1.0)
Monocytes Relative: 13 % — ABNORMAL HIGH (ref 3–12)
Neutro Abs: 4.7 10*3/uL (ref 1.7–7.7)
Neutrophils Relative %: 62 % (ref 43–77)
Platelets: 281 10*3/uL (ref 150–400)
RBC: 4.73 MIL/uL (ref 3.87–5.11)
RDW: 13.6 % (ref 11.5–15.5)
WBC: 7.6 10*3/uL (ref 4.0–10.5)

## 2012-10-04 LAB — COMPREHENSIVE METABOLIC PANEL
ALT: 14 U/L (ref 0–35)
AST: 14 U/L (ref 0–37)
Albumin: 3.9 g/dL (ref 3.5–5.2)
Alkaline Phosphatase: 76 U/L (ref 39–117)
BUN: 8 mg/dL (ref 6–23)
CO2: 28 mEq/L (ref 19–32)
Calcium: 9.8 mg/dL (ref 8.4–10.5)
Chloride: 100 mEq/L (ref 96–112)
Creatinine, Ser: 0.75 mg/dL (ref 0.50–1.10)
GFR calc Af Amer: >90 mL/min (ref >90)
GFR calc non Af Amer: >90 mL/min (ref >90)
Glucose, Bld: 92 mg/dL (ref 70–99)
Potassium: 3.9 mEq/L (ref 3.5–5.1)
Sodium: 136 mEq/L (ref 135–145)
Total Bilirubin: 0.3 mg/dL (ref 0.3–1.2)
Total Protein: 7.7 g/dL (ref 6.0–8.3)

## 2012-10-04 LAB — URINALYSIS, ROUTINE W REFLEX MICROSCOPIC
Bilirubin Urine: NEGATIVE
Glucose, UA: NEGATIVE mg/dL
Hgb urine dipstick: NEGATIVE
Ketones, ur: NEGATIVE mg/dL
Leukocytes, UA: NEGATIVE
Nitrite: NEGATIVE
Protein, ur: NEGATIVE mg/dL
Specific Gravity, Urine: 1.015 (ref 1.005–1.030)
Urobilinogen, UA: 0.2 mg/dL (ref 0.0–1.0)
pH: 5.5 (ref 5.0–8.0)

## 2012-10-04 MED ORDER — PROMETHAZINE HCL 25 MG/ML IJ SOLN
25.0000 mg | Freq: Once | INTRAMUSCULAR | Status: AC
  Administered 2012-10-04: 25 mg via INTRAVENOUS
  Filled 2012-10-04: qty 1

## 2012-10-04 MED ORDER — SODIUM CHLORIDE 0.9 % IV BOLUS (SEPSIS)
1000.0000 mL | Freq: Once | INTRAVENOUS | Status: AC
  Administered 2012-10-04: 1000 mL via INTRAVENOUS

## 2012-10-04 MED ORDER — PROMETHAZINE HCL 25 MG RE SUPP: 25.0000 mg | Freq: Four times a day (QID) | RECTAL | Status: DC | PRN

## 2012-10-04 MED ORDER — KETOROLAC TROMETHAMINE 30 MG/ML IJ SOLN
30.0000 mg | Freq: Once | INTRAMUSCULAR | Status: AC
  Administered 2012-10-04: 30 mg via INTRAVENOUS
  Filled 2012-10-04: qty 1

## 2012-10-04 NOTE — ED Notes (Signed)
[facility] stated that POCT urine preg negative, unable to cross over at this time and she will let PA know.

## 2012-10-04 NOTE — ED Provider Notes (Signed)
History    CSN: [id number] Arrival date & time 10/04/12  8295  First MD Initiated Contact with Patient 10/04/12 2002     Chief Complaint  Patient presents with  . Emesis  . Diarrhea   (Consider location/radiation/quality/duration/timing/severity/associated sxs/prior Treatment) HPI History provided by pt.   Pt has gastroparesis and neurocardiogenic syncope.  Presents today w/ 5 days N/V as well as 2 days of diarrhea.  Unable to tolerate food/fluids.  Experiences vomiting on a regular basis, but usually able to at least tolerate dry, bland foods.  No relief w/ compazine.  Denies fever, CP, SOB, abd pain, hematemesis/hematochezia/melena, urinary and vaginal sx.  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  Past abd surgeries include cholecystectomy. Past Medical History  Diagnosis Date  . Gastric paresis   . Migraine    Past Surgical History  Procedure Laterality Date  . Cholecystectomy    . Shoulder surgery    . Tonsillectomy    . Knee surgery    . Maxillary antrostomy     No family history on file. History  Substance Use Topics  . Smoking status: Never Smoker   . Smokeless tobacco: Never Used  . Alcohol Use: Yes     Comment: Socially    OB History   Grav Para Term Preterm Abortions TAB SAB Ect Mult Living                 Review of Systems  All other systems reviewed and are negative.    Allergies  Cefazolin and Cephalexin  Home Medications   Current Outpatient Rx  Name  Route  Sig  Dispense  Refill  . cyclobenzaprine (FLEXERIL) 10 MG tablet   Oral   Take 10 mg by mouth 3 (three) times daily as needed. For pain         . fludrocortisone (FLORINEF) 0.1 MG tablet   Oral   Take 0.1 mg by mouth 3 (three) times daily.         Marland Kitchen HYDROcodone-acetaminophen (VICODIN) 5-500 MG per tablet   Oral   Take 1 tablet by mouth every 8 (eight) hours as needed. For pain         . misoprostol (CYTOTEC) 200 MCG tablet   Oral   Take 200 mcg by mouth 4 (four) times daily.         Marland Kitchen  omeprazole (PRILOSEC) 40 MG capsule   Oral   Take 40 mg by mouth daily.          . ondansetron (ZOFRAN) 8 MG tablet   Oral   Take 8 mg by mouth every 8 (eight) hours as needed. For nausea         . prochlorperazine (COMPAZINE) 10 MG tablet   Oral   Take 10 mg by mouth every 6 (six) hours as needed.          BP 116/84  Pulse 87  Temp(Src) 98.2 F (36.8 C) (Oral)  Resp 18  SpO2 96%  LMP 09/14/2012 Physical Exam  Nursing note and vitals reviewed. Constitutional: She is oriented to person, place, and time. She appears well-developed and well-nourished. No distress.  HENT:  Head: Normocephalic and atraumatic.  Mouth/Throat: Oropharynx is clear and moist.  Eyes:  Normal appearance  Neck: Normal range of motion.  Cardiovascular: Normal rate and regular rhythm.   Pulmonary/Chest: Effort normal and breath sounds normal. No respiratory distress.  Abdominal: Soft. Bowel sounds are normal. She exhibits no distension and no mass. There is no tenderness. There  is no rebound and no guarding.  Genitourinary:  R CVA tenderness  Musculoskeletal: Normal range of motion.  Neurological: She is alert and oriented to person, place, and time.  Skin: Skin is warm and dry. No rash noted.  Psychiatric: She has a normal mood and affect. Her behavior is normal.    ED Course  Procedures (including critical care time) Labs Reviewed  CBC WITH DIFFERENTIAL - Abnormal; Notable for the following:    Monocytes Relative 13 (*)    All other components within normal limits  COMPREHENSIVE METABOLIC PANEL  LIPASE, BLOOD  URINALYSIS, ROUTINE W REFLEX MICROSCOPIC   No results found. 1. Vomiting     MDM  37yo F w/ gastroparesis and neurocardiogenic syncope presents w/ 5d N/V/D.  These sx are chronic but worse this week and she is not able to tolerate food/fluids.  On exam, afebrile, NAD, well-hydrated, abd benign.  IVF and phenergan to be administered.  Labs pending. 8:19 PM   Labs unremarkable.   Results discussed w/ pt.  Her nausea is slightly improved.  Another liter NS as well as 25mg  phenergan ordered. 9:33 PM   Nausea improved and tolerating pos.  Pt received toradol for leg cramps.  D/c'd home w/ phenergan suppositories and recommended GI f/u.  Return precautions discussed. 11:50 PM   Otilio Miu, PA-C 10/04/12 2350

## 2012-10-04 NOTE — ED Notes (Signed)
Pt reports nausea, vomiting, and diarrhea that has been going on since Monday evening. Pt states she has gastroparesis and neurogenic cardiac syncope (NCS), which contributes to her nausea, and has taken compazine for symptoms without releif. Pt denies abdominal pain.

## 2012-10-05 NOTE — ED Provider Notes (Signed)
Medical screening examination/treatment/procedure(s) were performed by non-physician practitioner and as supervising physician I was immediately available for consultation/collaboration.   Richardean Canal, MD 10/05/12 [phone]

## 2012-10-07 LAB — POCT PREGNANCY, URINE: Preg Test, Ur: NEGATIVE

## 2013-04-05 ENCOUNTER — Encounter (HOSPITAL_COMMUNITY): Payer: Self-pay | Admitting: Emergency Medicine

## 2013-04-05 ENCOUNTER — Emergency Department (HOSPITAL_COMMUNITY)
Admission: EM | Admit: 2013-04-05 | Discharge: 2013-04-05 | Disposition: A | Discharge status: Home / Self Care | Payer: No Typology Code available for payment source | Attending: Emergency Medicine | Admitting: Emergency Medicine

## 2013-04-05 DIAGNOSIS — R5383 Other fatigue: Secondary | ICD-10-CM

## 2013-04-05 DIAGNOSIS — K3184 Gastroparesis: Secondary | ICD-10-CM | POA: Insufficient documentation

## 2013-04-05 DIAGNOSIS — R5381 Other malaise: Secondary | ICD-10-CM | POA: Insufficient documentation

## 2013-04-05 DIAGNOSIS — R42 Dizziness and giddiness: Secondary | ICD-10-CM | POA: Insufficient documentation

## 2013-04-05 DIAGNOSIS — R112 Nausea with vomiting, unspecified: Secondary | ICD-10-CM | POA: Insufficient documentation

## 2013-04-05 DIAGNOSIS — Z79899 Other long term (current) drug therapy: Secondary | ICD-10-CM | POA: Insufficient documentation

## 2013-04-05 DIAGNOSIS — Z3202 Encounter for pregnancy test, result negative: Secondary | ICD-10-CM | POA: Insufficient documentation

## 2013-04-05 HISTORY — DX: Syncope and collapse: R55

## 2013-04-05 LAB — COMPREHENSIVE METABOLIC PANEL
ALT: 20 U/L (ref 0–35)
AST: 17 U/L (ref 0–37)
Albumin: 4.1 g/dL (ref 3.5–5.2)
Alkaline Phosphatase: 73 U/L (ref 39–117)
BUN: 9 mg/dL (ref 6–23)
CO2: 27 mEq/L (ref 19–32)
Calcium: 9.2 mg/dL (ref 8.4–10.5)
Chloride: 101 mEq/L (ref 96–112)
Creatinine, Ser: 0.8 mg/dL (ref 0.50–1.10)
GFR calc Af Amer: >90 mL/min (ref >90)
GFR calc non Af Amer: >90 mL/min (ref >90)
Glucose, Bld: 94 mg/dL (ref 70–99)
Potassium: 3.5 mEq/L — ABNORMAL LOW (ref 3.7–5.3)
Sodium: 139 mEq/L (ref 137–147)
Total Bilirubin: 0.3 mg/dL (ref 0.3–1.2)
Total Protein: 7.8 g/dL (ref 6.0–8.3)

## 2013-04-05 LAB — CBC
HCT: 38.9 % (ref 36.0–46.0)
Hemoglobin: 13.4 g/dL (ref 12.0–15.0)
MCH: 28.2 pg (ref 26.0–34.0)
MCHC: 34.4 g/dL (ref 30.0–36.0)
MCV: 81.9 fL (ref 78.0–100.0)
Platelets: 298 10*3/uL (ref 150–400)
RBC: 4.75 MIL/uL (ref 3.87–5.11)
RDW: 13.5 % (ref 11.5–15.5)
WBC: 8.8 10*3/uL (ref 4.0–10.5)

## 2013-04-05 LAB — URINALYSIS, ROUTINE W REFLEX MICROSCOPIC
Bilirubin Urine: NEGATIVE
Glucose, UA: NEGATIVE mg/dL
Hgb urine dipstick: NEGATIVE
Ketones, ur: NEGATIVE mg/dL
Leukocytes, UA: NEGATIVE
Nitrite: NEGATIVE
Protein, ur: NEGATIVE mg/dL
Specific Gravity, Urine: 1.019 (ref 1.005–1.030)
Urobilinogen, UA: 0.2 mg/dL (ref 0.0–1.0)
pH: 7 (ref 5.0–8.0)

## 2013-04-05 LAB — PREGNANCY, URINE: Preg Test, Ur: NEGATIVE

## 2013-04-05 LAB — LIPASE, BLOOD: Lipase: 33 U/L (ref 11–59)

## 2013-04-05 MED ORDER — ONDANSETRON HCL 4 MG/2ML IJ SOLN
4.0000 mg | Freq: Once | INTRAMUSCULAR | Status: AC
  Administered 2013-04-05: 4 mg via INTRAVENOUS
  Filled 2013-04-05: qty 2

## 2013-04-05 MED ORDER — PANTOPRAZOLE SODIUM 40 MG IV SOLR
40.0000 mg | Freq: Once | INTRAVENOUS | Status: AC
  Administered 2013-04-05: 40 mg via INTRAVENOUS
  Filled 2013-04-05: qty 40

## 2013-04-05 MED ORDER — METOCLOPRAMIDE HCL 10 MG PO TABS: 10.0000 mg | ORAL_TABLET | Freq: Four times a day (QID) | ORAL | Status: DC | PRN

## 2013-04-05 MED ORDER — METOCLOPRAMIDE HCL 5 MG/ML IJ SOLN
10.0000 mg | Freq: Once | INTRAMUSCULAR | Status: AC
  Administered 2013-04-05: 10 mg via INTRAVENOUS
  Filled 2013-04-05: qty 2

## 2013-04-05 MED ORDER — SODIUM CHLORIDE 0.9 % IV BOLUS (SEPSIS)
1000.0000 mL | Freq: Once | INTRAVENOUS | Status: AC
  Administered 2013-04-05: 1000 mL via INTRAVENOUS

## 2013-04-05 NOTE — ED Notes (Signed)
Pt has a  Light pink rash to b/l arms, face is flushed.  Pt states that this is not new and happens when she gets hot.

## 2013-04-05 NOTE — ED Notes (Signed)
Per pt she has a hx of gastroparesis.  This afternoon pt began feeling nauseated and vomited x 7.  Pt states she is having leg cramps 6/10 in severity.

## 2013-04-05 NOTE — ED Provider Notes (Signed)
CSN: [id number]     Arrival date & time 04/05/13  1916 History   First MD Initiated Contact with Patient 04/05/13 1935     Chief Complaint  Patient presents with  . Emesis   (Consider location/radiation/quality/duration/timing/severity/associated sxs/prior Treatment) Patient is a 38 y.o. female presenting with vomiting. The history is provided by the patient.  Emesis Associated symptoms: no abdominal pain, no chills, no diarrhea, no headaches and no sore throat   pt with hx gastroparesis, states nv x 5-6 episodes today. States color of recently ingested fluids, not bloody or bilious. States has had same symptoms in past related to gastroparesis. Denies abd pain or distension. Feels generally weak, lightheaded when stands, 'like Im dehydrated'.  Has been having normal bms, including today. No known ill contacts or bad food ingestion. No recent change in meds, compliant w normal meds. No fever or chills. No gu c/o. No uri c/o. States prior extensive workup for same including gastric emptying study, egd, u/s, scans.  States is followed by gi, no definite cause to gastroparesis, and has been referred to regional center in coming months for further eval.      Past Medical History  Diagnosis Date  . Gastric paresis   . Migraine   . Syncope     neurocargenic   Past Surgical History  Procedure Laterality Date  . Cholecystectomy    . Shoulder surgery    . Tonsillectomy    . Knee surgery    . Maxillary antrostomy     No family history on file. History  Substance Use Topics  . Smoking status: Never Smoker   . Smokeless tobacco: Never Used  . Alcohol Use: Yes     Comment: Socially    OB History   Grav Para Term Preterm Abortions TAB SAB Ect Mult Living                 Review of Systems  Constitutional: Negative for fever and chills.  HENT: Negative for sore throat.   Eyes: Negative for redness.  Respiratory: Negative for cough and shortness of breath.   Cardiovascular: Negative  for chest pain.  Gastrointestinal: Positive for nausea and vomiting. Negative for abdominal pain, diarrhea and constipation.  Genitourinary: Negative for dysuria and flank pain.  Musculoskeletal: Negative for back pain and neck pain.  Skin: Negative for rash.  Neurological: Negative for headaches.  Hematological: Does not bruise/bleed easily.  Psychiatric/Behavioral: Negative for confusion.    Allergies  Cefazolin and Cephalexin  Home Medications   Current Outpatient Rx  Name  Route  Sig  Dispense  Refill  . bethanechol (URECHOLINE) 25 MG tablet   Oral   Take 25 mg by mouth at bedtime.         . cyclobenzaprine (FLEXERIL) 10 MG tablet   Oral   Take 10 mg by mouth 3 (three) times daily as needed. For pain         . HYDROcodone-acetaminophen (NORCO/VICODIN) 5-325 MG per tablet   Oral   Take 1-2 tablets by mouth every 6 (six) hours as needed for pain.         . metoprolol succinate (TOPROL-XL) 25 MG 24 hr tablet   Oral   Take 25 mg by mouth daily.         . ondansetron (ZOFRAN) 8 MG tablet   Oral   Take 8 mg by mouth every 8 (eight) hours as needed. For nausea         . prochlorperazine (COMPAZINE)  10 MG tablet   Oral   Take 10 mg by mouth every 6 (six) hours as needed.         . promethazine (PHENERGAN) 25 MG suppository   Rectal   Place 25 mg rectally every 6 (six) hours as needed for nausea.         . promethazine (PHENERGAN) 25 MG suppository   Rectal   Place 1 suppository (25 mg total) rectally every 6 (six) hours as needed for nausea.   12 each   0   . sertraline (ZOLOFT) 100 MG tablet   Oral   Take 50 mg by mouth at bedtime.         . SUMAtriptan (IMITREX) 6 MG/0.5ML SOLN injection   Subcutaneous   Inject 6 mg into the skin every 2 (two) hours as needed for migraine or headache. F          BP 126/81  Pulse 85  Temp(Src) 98.7 F (37.1 C) (Oral)  SpO2 97%  LMP 03/05/2013 Physical Exam  Nursing note and vitals  reviewed. Constitutional: She appears well-developed and well-nourished. No distress.  HENT:  Mouth/Throat: Oropharynx is clear and moist.  Eyes: Conjunctivae are normal. No scleral icterus.  Neck: Neck supple. No tracheal deviation present.  Cardiovascular: Normal rate, regular rhythm, normal heart sounds and intact distal pulses.   Pulmonary/Chest: Effort normal and breath sounds normal. No respiratory distress.  Abdominal: Soft. Normal appearance and bowel sounds are normal. She exhibits no distension and no mass. There is no tenderness. There is no rebound and no guarding.  Genitourinary:  No cva tenderness  Musculoskeletal: She exhibits no edema.  Neurological: She is alert.  Skin: Skin is warm and dry. No rash noted. She is not diaphoretic.  Psychiatric: She has a normal mood and affect.    ED Course  Procedures (including critical care time)  Results for orders placed during the hospital encounter of 04/05/13  CBC      Result Value Range   WBC 8.8  4.0 - 10.5 K/uL   RBC 4.75  3.87 - 5.11 MIL/uL   Hemoglobin 13.4  12.0 - 15.0 g/dL   HCT 96.0  45.4 - 09.8 %   MCV 81.9  78.0 - 100.0 fL   MCH 28.2  26.0 - 34.0 pg   MCHC 34.4  30.0 - 36.0 g/dL   RDW 11.9  14.7 - 82.9 %   Platelets 298  150 - 400 K/uL  COMPREHENSIVE METABOLIC PANEL      Result Value Range   Sodium 139  137 - 147 mEq/L   Potassium 3.5 (*) 3.7 - 5.3 mEq/L   Chloride 101  96 - 112 mEq/L   CO2 27  19 - 32 mEq/L   Glucose, Bld 94  70 - 99 mg/dL   BUN 9  6 - 23 mg/dL   Creatinine, Ser 5.62  0.50 - 1.10 mg/dL   Calcium 9.2  8.4 - 13.0 mg/dL   Total Protein 7.8  6.0 - 8.3 g/dL   Albumin 4.1  3.5 - 5.2 g/dL   AST 17  0 - 37 U/L   ALT 20  0 - 35 U/L   Alkaline Phosphatase 73  39 - 117 U/L   Total Bilirubin 0.3  0.3 - 1.2 mg/dL   GFR calc non Af Amer >90  >90 mL/min   GFR calc Af Amer >90  >90 mL/min  LIPASE, BLOOD      Result Value Range  Lipase 33  11 - 59 U/L  URINALYSIS, ROUTINE W REFLEX MICROSCOPIC       Result Value Range   Color, Urine YELLOW  YELLOW   APPearance CLEAR  CLEAR   Specific Gravity, Urine 1.019  1.005 - 1.030   pH 7.0  5.0 - 8.0   Glucose, UA NEGATIVE  NEGATIVE mg/dL   Hgb urine dipstick NEGATIVE  NEGATIVE   Bilirubin Urine NEGATIVE  NEGATIVE   Ketones, ur NEGATIVE  NEGATIVE mg/dL   Protein, ur NEGATIVE  NEGATIVE mg/dL   Urobilinogen, UA 0.2  0.0 - 1.0 mg/dL   Nitrite NEGATIVE  NEGATIVE   Leukocytes, UA NEGATIVE  NEGATIVE  PREGNANCY, URINE      Result Value Range   Preg Test, Ur NEGATIVE  NEGATIVE      EKG Interpretation   None       MDM  Iv ns bolus. protonix iv. reglan iv.  Labs.  Reviewed nursing notes and prior charts for additional history.   2nd liter ns iv.  Recheck pt much improved. No nv. abd soft nt.  Pt appears stable for d/c.     Suzi RootsKevin E Glenford Garis, MD 04/05/13 2235

## 2013-04-05 NOTE — Discharge Instructions (Signed)
Rest. Drink plenty of fluids. You may take reglan as need. Follow up with your doctor/gi doctor in next couple days if symptoms fail to improve/resolve. Return to ER if worse, new symptoms, persistent vomiting, severe abdominal pain, fevers, other concern.      Gastroparesis  Gastroparesis is also called slowed stomach emptying (delayed gastric emptying). It is a condition in which the stomach takes too long to empty its contents. It often happens in people with diabetes.  CAUSES  Gastroparesis happens when nerves to the stomach are damaged or stop working. When the nerves are damaged, the muscles of the stomach and intestines do not work normally. The movement of food is slowed or stopped. High blood glucose (sugar) causes changes in nerves and can damage the blood vessels that carry oxygen and nutrients to the nerves. RISK FACTORS  Diabetes.  Post-viral syndromes.  Eating disorders (anorexia, bulimia).  Surgery on the stomach or vagus nerve.  Gastroesophageal reflux disease (rarely).  Smooth muscle disorders (amyloidosis, scleroderma).  Metabolic disorders, including hypothyroidism.  Parkinson disease. SYMPTOMS   Heartburn.  Feeling sick to your stomach (nausea).  Vomiting of undigested food.  An early feeling of fullness when eating.  Weight loss.  Abdominal bloating.  Erratic blood glucose levels.  Lack of appetite.  Gastroesophageal reflux.  Spasms of the stomach wall. Complications can include:  Bacterial overgrowth in stomach. Food stays in the stomach and can ferment and cause bacteria to grow.  Weight loss due to difficulty digesting and absorbing nutrients.  Vomiting.  Obstruction in the stomach. Undigested food can harden and cause nausea and vomiting.  Blood glucose fluctuations caused by inconsistent food absorption. DIAGNOSIS  The diagnosis of gastroparesis is confirmed through one or more of the following tests:  Barium X-rays and scans.  These tests look at how long it takes for food to move through the stomach.  Gastric manometry. This test measures electrical and muscular activity in the stomach. A thin tube is passed down the throat into the stomach. The tube contains a wire that takes measurements of the stomach's electrical and muscular activity as it digests liquids and solid food.  Endoscopy. This procedure is done with a long, thin tube called an endoscope. It is passed through the mouth and gently down the esophagus into the stomach. This tube helps the caregiver look at the lining of the stomach to check for any abnormalities.  Ultrasonography. This can rule out gallbladder disease or pancreatitis. This test will outline and define the shape of the gallbladder and pancreas. TREATMENT   Treatments may include:  Exercise.  Medicines to control nausea and vomiting.  Medicines to stimulate stomach muscles.  Changes in what and when you eat.  Having smaller meals more often.  Eating low-fiber forms of high-fiber foods, such as eating cooked vegetables instead of raw vegetables.  Eating low-fat foods.  Consuming liquids, which are easier to digest.  In severe cases, feeding tubes and intravenous (IV) feeding may be needed. It is important to note that in most cases, treatment does not cure gastroparesis. It is usually a lasting (chronic) condition. Treatment helps you manage the underlying condition so that you can be as healthy and comfortable as possible. Other treatments  A gastric neurostimulator has been developed to assist people with gastroparesis. The battery-operated device is surgically implanted. It emits mild electrical pulses to help improve stomach emptying and to control nausea and vomiting.  The use of botulinum toxin has been shown to improve stomach emptying by  decreasing the prolonged contractions of the muscle between the stomach and the small intestine (pyloric sphincter). The benefits are  temporary. SEEK MEDICAL CARE IF:   You have diabetes and you are having problems keeping your blood glucose in goal range.  You are having nausea, vomiting, bloating, or early feelings of fullness with eating.  Your symptoms do not change with a change in diet. Document Released: 03/13/2005 Document Revised: 07/08/2012 Document Reviewed: 08/20/2008 [facility] Patient Information 2014 [place], [place].    Nausea and Vomiting Nausea is a sick feeling that often comes before throwing up (vomiting). Vomiting is a reflex where stomach contents come out of your mouth. Vomiting can cause severe loss of body fluids (dehydration). Children and elderly adults can become dehydrated quickly, especially if they also have diarrhea. Nausea and vomiting are symptoms of a condition or disease. It is important to find the cause of your symptoms. CAUSES   Direct irritation of the stomach lining. This irritation can result from increased acid production (gastroesophageal reflux disease), infection, food poisoning, taking certain medicines (such as nonsteroidal anti-inflammatory drugs), alcohol use, or tobacco use.  Signals from the brain.These signals could be caused by a headache, heat exposure, an inner ear disturbance, increased pressure in the brain from injury, infection, a tumor, or a concussion, pain, emotional stimulus, or metabolic problems.  An obstruction in the gastrointestinal tract (bowel obstruction).  Illnesses such as diabetes, hepatitis, gallbladder problems, appendicitis, kidney problems, cancer, sepsis, atypical symptoms of a heart attack, or eating disorders.  Medical treatments such as chemotherapy and radiation.  Receiving medicine that makes you sleep (general anesthetic) during surgery. DIAGNOSIS Your caregiver may ask for tests to be done if the problems do not improve after a few days. Tests may also be done if symptoms are severe or if the reason for the nausea and vomiting is not  clear. Tests may include:  Urine tests.  Blood tests.  Stool tests.  Cultures (to look for evidence of infection).  X-rays or other imaging studies. Test results can help your caregiver make decisions about treatment or the need for additional tests. TREATMENT You need to stay well hydrated. Drink frequently but in small amounts.You may wish to drink water, sports drinks, clear broth, or eat frozen ice pops or gelatin dessert to help stay hydrated.When you eat, eating slowly may help prevent nausea.There are also some antinausea medicines that may help prevent nausea. HOME CARE INSTRUCTIONS   Take all medicine as directed by your caregiver.  If you do not have an appetite, do not force yourself to eat. However, you must continue to drink fluids.  If you have an appetite, eat a normal diet unless your caregiver tells you differently.  Eat a variety of complex carbohydrates (rice, wheat, potatoes, bread), lean meats, yogurt, fruits, and vegetables.  Avoid high-fat foods because they are more difficult to digest.  Drink enough water and fluids to keep your urine clear or pale yellow.  If you are dehydrated, ask your caregiver for specific rehydration instructions. Signs of dehydration may include:  Severe thirst.  Dry lips and mouth.  Dizziness.  Dark urine.  Decreasing urine frequency and amount.  Confusion.  Rapid breathing or pulse. SEEK IMMEDIATE MEDICAL CARE IF:   You have blood or brown flecks (like coffee grounds) in your vomit.  You have black or bloody stools.  You have a severe headache or stiff neck.  You are confused.  You have severe abdominal pain.  You have chest pain or  trouble breathing.  You do not urinate at least once every 8 hours.  You develop cold or clammy skin.  You continue to vomit for longer than 24 to 48 hours.  You have a fever. MAKE SURE YOU:   Understand these instructions.  Will watch your condition.  Will get help  right away if you are not doing well or get worse. Document Released: 03/13/2005 Document Revised: 06/05/2011 Document Reviewed: 08/10/2010 [facility] Patient Information 2014 [place], [place].

## 2014-04-24 ENCOUNTER — Other Ambulatory Visit: Payer: Self-pay | Admitting: Family Medicine

## 2014-04-24 ENCOUNTER — Ambulatory Visit
Admission: RE | Admit: 2014-04-24 | Discharge: 2014-04-24 | Disposition: A | Discharge status: Home / Self Care | Payer: No Typology Code available for payment source | Source: Ambulatory Visit | Attending: Family Medicine | Admitting: Family Medicine

## 2014-04-24 DIAGNOSIS — R05 Cough: Secondary | ICD-10-CM

## 2014-04-24 DIAGNOSIS — R062 Wheezing: Secondary | ICD-10-CM

## 2014-04-24 DIAGNOSIS — R059 Cough, unspecified: Secondary | ICD-10-CM

## 2015-06-30 DIAGNOSIS — Q6589 Other specified congenital deformities of hip: Secondary | ICD-10-CM | POA: Diagnosis not present

## 2015-06-30 DIAGNOSIS — M25552 Pain in left hip: Secondary | ICD-10-CM | POA: Diagnosis not present

## 2015-06-30 DIAGNOSIS — M25551 Pain in right hip: Secondary | ICD-10-CM | POA: Diagnosis not present

## 2015-07-19 DIAGNOSIS — D649 Anemia, unspecified: Secondary | ICD-10-CM | POA: Diagnosis not present

## 2015-07-19 DIAGNOSIS — M2550 Pain in unspecified joint: Secondary | ICD-10-CM | POA: Diagnosis not present

## 2015-07-19 DIAGNOSIS — Z01818 Encounter for other preprocedural examination: Secondary | ICD-10-CM | POA: Diagnosis not present

## 2015-07-19 DIAGNOSIS — R55 Syncope and collapse: Secondary | ICD-10-CM | POA: Diagnosis not present

## 2015-08-05 DIAGNOSIS — Z01818 Encounter for other preprocedural examination: Secondary | ICD-10-CM | POA: Diagnosis not present

## 2015-08-05 DIAGNOSIS — D649 Anemia, unspecified: Secondary | ICD-10-CM | POA: Diagnosis not present

## 2015-08-05 DIAGNOSIS — R55 Syncope and collapse: Secondary | ICD-10-CM | POA: Diagnosis not present

## 2015-08-05 DIAGNOSIS — M2550 Pain in unspecified joint: Secondary | ICD-10-CM | POA: Diagnosis not present

## 2015-08-12 ENCOUNTER — Other Ambulatory Visit (HOSPITAL_COMMUNITY): Payer: Self-pay | Admitting: *Deleted

## 2015-08-12 NOTE — Progress Notes (Addendum)
EKG 06-02-15 AND LOV DR HUMMEL CARDIOLOGY 06-02-15 [facility]  CLEARANCE DR MORROW [facility] C[facility] CLEARANCE DR HUMMEL 06-02-15 ON CHART

## 2015-08-12 NOTE — Patient Instructions (Addendum)
Angela Harrison  08/12/2015   Your procedure is scheduled on: 08-24-15  Report to [facility] Main  Entrance take [facility]  elevators to 3rd floor to  Short Stay Center at 100 pm  Call this number if you have problems the morning of surgery [phone]   Remember: ONLY 1 PERSON MAY GO WITH YOU TO SHORT STAY TO GET  READY MORNING OF YOUR SURGERY.  Do not eat food  :After Midnight, CLEAR LIQUIDS MIDNIGHT NIGHT BEFORE SURGERY UNTIL 1000 AM DAY OF SURGERY, NOTHING BY MOUTH AFTER 1000 AM DAY OF SURGERY.     Take these medicines the morning of surgery with A SIP OF WATER: FLEXERIL IF NEEDED, HYDROCODONE IF NEEDED                               You may not have any metal on your body including hair pins and              piercings  Do not wear jewelry, make-up, lotions, powders or perfumes, deodorant             Do not wear nail polish.  Do not shave  48 hours prior to surgery.              Men may shave face and neck.   Do not bring valuables to the hospital. [place] IS NOT             RESPONSIBLE   FOR VALUABLES.  Contacts, dentures or bridgework may not be worn into surgery.  Leave suitcase in the car. After surgery it may be brought to your room.                  Please read over the following fact sheets you were given: _____________________________________________________________________                CLEAR LIQUID DIET   Foods Allowed                                                                     Foods Excluded  Coffee and tea, regular and decaf                             liquids that you cannot  Plain Jell-O in any flavor                                             see through such as: Fruit ices (not with fruit pulp)                                     milk, soups, orange juice  Iced Popsicles  All solid food Carbonated beverages, regular and diet                                    Cranberry, grape and  apple juices Sports drinks like Gatorade Lightly seasoned clear broth or consume(fat free) Sugar, honey syrup  Sample Menu Breakfast                                Lunch                                     Supper Cranberry juice                    Beef broth                            Chicken broth Jell-O                                     Grape juice                           Apple juice Coffee or tea                        Jell-O                                      Popsicle                                                Coffee or tea                        Coffee or tea  _____________________________________________________________________  [facility] - Preparing for Surgery Before surgery, you can play an important role.  Because skin is not sterile, your skin needs to be as free of germs as possible.  You can reduce the number of germs on your skin by washing with CHG (chlorahexidine gluconate) soap before surgery.  CHG is an antiseptic cleaner which kills germs and bonds with the skin to continue killing germs even after washing. Please DO NOT use if you have an allergy to CHG or antibacterial soaps.  If your skin becomes reddened/irritated stop using the CHG and inform your nurse when you arrive at Short Stay. Do not shave (including legs and underarms) for at least 48 hours prior to the first CHG shower.  You may shave your face/neck. Please follow these instructions carefully:  1.  Shower with CHG Soap the night before surgery and the  morning of Surgery.  2.  If you choose to wash your hair, wash your hair first as usual with your  normal  shampoo.  3.  After you shampoo, rinse your hair and body thoroughly to remove the  shampoo.  4.  Use CHG as you would any other liquid soap.  You can apply chg directly  to the skin and wash                       Gently with a scrungie or clean washcloth.  5.  Apply the CHG Soap to your body ONLY FROM THE NECK DOWN.   Do  not use on face/ open                           Wound or open sores. Avoid contact with eyes, ears mouth and genitals (private parts).                       Wash face,  Genitals (private parts) with your normal soap.             6.  Wash thoroughly, paying special attention to the area where your surgery  will be performed.  7.  Thoroughly rinse your body with warm water from the neck down.  8.  DO NOT shower/wash with your normal soap after using and rinsing off  the CHG Soap.                9.  Pat yourself dry with a clean towel.            10.  Wear clean pajamas.            11.  Place clean sheets on your bed the night of your first shower and do not  sleep with pets. Day of Surgery : Do not apply any lotions/deodorants the morning of surgery.  Please wear clean clothes to the hospital/surgery center.  FAILURE TO FOLLOW THESE INSTRUCTIONS MAY RESULT IN THE CANCELLATION OF YOUR SURGERY PATIENT SIGNATURE_________________________________  NURSE SIGNATURE__________________________________  ________________________________________________________________________    CLEAR LIQUID DIET   Foods Allowed                                                                     Foods Excluded  Coffee and tea, regular and decaf                             liquids that you cannot  Plain Jell-O in any flavor                                             see through such as: Fruit ices (not with fruit pulp)                                     milk, soups, orange juice  Iced Popsicles                                    All solid food Carbonated beverages, regular and diet  Cranberry, grape and apple juices Sports drinks like Gatorade Lightly seasoned clear broth or consume(fat free) Sugar, honey syrup  Sample Menu Breakfast                                Lunch                                     Supper Cranberry juice                    Beef broth                             Chicken broth Jell-O                                     Grape juice                           Apple juice Coffee or tea                        Jell-O                                      Popsicle                                                Coffee or tea                        Coffee or tea  _____________________________________________________________________   WHAT IS A BLOOD TRANSFUSION? Blood Transfusion Information  A transfusion is the replacement of blood or some of its parts. Blood is made up of multiple cells which provide different functions.  Red blood cells carry oxygen and are used for blood loss replacement.  White blood cells fight against infection.  Platelets control bleeding.  Plasma helps clot blood.  Other blood products are available for specialized needs, such as hemophilia or other clotting disorders. BEFORE THE TRANSFUSION  Who gives blood for transfusions?   Healthy volunteers who are fully evaluated to make sure their blood is safe. This is blood bank blood. Transfusion therapy is the safest it has ever been in the practice of medicine. Before blood is taken from a donor, a complete history is taken to make sure that person has no history of diseases nor engages in risky social behavior (examples are intravenous drug use or sexual activity with multiple partners). The donor's travel history is screened to minimize risk of transmitting infections, such as malaria. The donated blood is tested for signs of infectious diseases, such as HIV and hepatitis. The blood is then tested to be sure it is compatible with you in order to minimize the chance of a transfusion reaction. If you or a relative donates blood, this is often done in anticipation of surgery and is not appropriate for emergency situations. It takes many days to process the donated blood.  RISKS AND COMPLICATIONS Although transfusion therapy is very safe and saves many lives, the main dangers of  transfusion include:   Getting an infectious disease.  Developing a transfusion reaction. This is an allergic reaction to something in the blood you were given. Every precaution is taken to prevent this. The decision to have a blood transfusion has been considered carefully by your caregiver before blood is given. Blood is not given unless the benefits outweigh the risks. AFTER THE TRANSFUSION  Right after receiving a blood transfusion, you will usually feel much better and more energetic. This is especially true if your red blood cells have gotten low (anemic). The transfusion raises the level of the red blood cells which carry oxygen, and this usually causes an energy increase.  The nurse administering the transfusion will monitor you carefully for complications. HOME CARE INSTRUCTIONS  No special instructions are needed after a transfusion. You may find your energy is better. Speak with your caregiver about any limitations on activity for underlying diseases you may have. SEEK MEDICAL CARE IF:   Your condition is not improving after your transfusion.  You develop redness or irritation at the intravenous (IV) site. SEEK IMMEDIATE MEDICAL CARE IF:  Any of the following symptoms occur over the next 12 hours:  Shaking chills.  You have a temperature by mouth above 102 F (38.9 C), not controlled by medicine.  Chest, back, or muscle pain.  People around you feel you are not acting correctly or are confused.  Shortness of breath or difficulty breathing.  Dizziness and fainting.  You get a rash or develop hives.  You have a decrease in urine output.  Your urine turns a dark color or changes to pink, red, or brown. Any of the following symptoms occur over the next 10 days:  You have a temperature by mouth above 102 F (38.9 C), not controlled by medicine.  Shortness of breath.  Weakness after normal activity.  The white part of the eye turns yellow (jaundice).  You have a  decrease in the amount of urine or are urinating less often.  Your urine turns a dark color or changes to pink, red, or brown. Document Released: 03/10/2000 Document Revised: 06/05/2011 Document Reviewed: 10/28/2007 [facility] Patient Information 2014 [place], [place].  _______________________________________________________________________

## 2015-08-14 NOTE — H&P (Signed)
TOTAL HIP ADMISSION H&P  Patient is admitted for left total hip arthroplasty, anterior approach.  Subjective:  Chief Complaint:     Left hip primary OA / pain, secondary to dysplasia  HPI: Angela Harrison, 40 y.o. female, has a history of pain and functional disability in the left hip(s) due to arthritis and patient has failed non-surgical conservative treatments for greater than 12 weeks to include NSAID's and/or analgesics, corticosteriod injections and activity modification.  Onset of symptoms was abrupt starting in January 2016 years ago with rapidlly worsening course since that time.The patient noted no past surgery on the left hip(s).  Patient currently rates pain in the left hip at 9 out of 10 with activity. Patient has night pain, worsening of pain with activity and weight bearing, trendelenberg gait, pain that interfers with activities of daily living and pain with passive range of motion. Patient has evidence of periarticular osteophytes, joint space narrowing and hip dysplasia by imaging studies. This condition presents safety issues increasing the risk of falls.  There is no current active infection.  Risks, benefits and expectations were discussed with the patient.  Risks including but not limited to the risk of anesthesia, blood clots, nerve damage, blood vessel damage, failure of the prosthesis, infection and up to and including death.  Patient understand the risks, benefits and expectations and wishes to proceed with surgery.   PCP: Farris Has, MD  D/C Plans:      Home with HHPT  Post-op Meds:       No Rx given   Tranexamic Acid:      To be given - IV   Decadron:      Is to be given  FYI:     ASA  Norco    Patient Active Problem List   Diagnosis Date Noted  . PANIC DISORDER WITHOUT AGORAPHOBIA 09/24/2007  . MIGRAINE HEADACHE 09/24/2007  . 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 DYSPNEA 09/24/2007   Past Medical History  Diagnosis Date  . Gastric paresis   .  Migraine   . Syncope     neurocargenic    Past Surgical History  Procedure Laterality Date  . Cholecystectomy    . Shoulder surgery    . Tonsillectomy    . Knee surgery    . Maxillary antrostomy      No prescriptions prior to admission   Allergies  Allergen Reactions  . Cephalosporins Hives, Swelling, Rash and Other (See Comments)    Can tolerate Penicillin  . Cefazolin Hives, Swelling, Rash and Other (See Comments)    Can tolerate Penicillin   . Cephalexin Hives, Swelling, Rash and Other (See Comments)    Can tolerate Penicillin     Social History  Substance Use Topics  . Smoking status: Never Smoker   . Smokeless tobacco: Never Used  . Alcohol Use: Yes     Comment: Socially        Review of Systems  Constitutional: Negative.   Eyes: Negative.   Respiratory: Negative.   Cardiovascular: Negative.   Gastrointestinal: Negative.   Genitourinary: Negative.   Musculoskeletal: Positive for joint pain.  Skin: Negative.   Neurological: Positive for headaches.  Endo/Heme/Allergies: Negative.   Psychiatric/Behavioral: The patient is nervous/anxious.     Objective:  Physical Exam  Constitutional: She is oriented to person, place, and time. She appears well-developed.  HENT:  Head: Normocephalic.  Eyes: Pupils are equal, round, and reactive to light.  Neck: Neck supple. No JVD present. No  tracheal deviation present. No thyromegaly present.  Cardiovascular: Normal rate, regular rhythm, normal heart sounds and intact distal pulses.   Respiratory: Effort normal and breath sounds normal. No stridor. No respiratory distress. She has no wheezes.  GI: Soft. There is no tenderness. There is no guarding.  Musculoskeletal:       Left hip: She exhibits decreased range of motion, decreased strength, tenderness and bony tenderness. She exhibits no swelling, no deformity and no laceration.  Lymphadenopathy:    She has no cervical adenopathy.  Neurological: She is alert and  oriented to person, place, and time.  Skin: Skin is warm and dry.  Psychiatric: She has a normal mood and affect.      Labs:  Estimated body mass index is 28.19 kg/(m^2) as calculated from the following:   Height as of 09/24/07: 5\' 10"  (1.778 m).   Weight as of 09/24/07: 89.132 kg (196 lb 8 oz).   Imaging Review Plain radiographs demonstrate severe degenerative joint disease of the left hip(s). The bone quality appears to be good for age and reported activity level.  Assessment/Plan:  End stage arthritis, left hip(s)  The patient history, physical examination, clinical judgement of the provider and imaging studies are consistent with end stage degenerative joint disease of the left hip(s) and total hip arthroplasty is deemed medically necessary. The treatment options including medical management, injection therapy, arthroscopy and arthroplasty were discussed at length. The risks and benefits of total hip arthroplasty were presented and reviewed. The risks due to aseptic loosening, infection, stiffness, dislocation/subluxation,  thromboembolic complications and other imponderables were discussed.  The patient acknowledged the explanation, agreed to proceed with the plan and consent was signed. Patient is being admitted for inpatient treatment for surgery, pain control, PT, OT, prophylactic antibiotics, VTE prophylaxis, progressive ambulation and ADL's and discharge planning.The patient is planning to be discharged home with home health services.      Anastasio AuerbachMatthew S. Bettye Sitton   PA-C  08/14/2015, 2:13 PM

## 2015-08-16 ENCOUNTER — Encounter (HOSPITAL_COMMUNITY): Payer: Self-pay

## 2015-08-16 ENCOUNTER — Encounter (HOSPITAL_COMMUNITY)
Admission: RE | Admit: 2015-08-16 | Discharge: 2015-08-16 | Disposition: A | Discharge status: Home / Self Care | Payer: BLUE CROSS/BLUE SHIELD | Source: Ambulatory Visit | Attending: Orthopedic Surgery | Admitting: Orthopedic Surgery

## 2015-08-16 DIAGNOSIS — Z01812 Encounter for preprocedural laboratory examination: Secondary | ICD-10-CM | POA: Insufficient documentation

## 2015-08-16 DIAGNOSIS — M1612 Unilateral primary osteoarthritis, left hip: Secondary | ICD-10-CM | POA: Diagnosis not present

## 2015-08-16 HISTORY — DX: Unspecified osteoarthritis, unspecified site: M19.90

## 2015-08-16 HISTORY — DX: Hypermobile Ehlers-Danlos syndrome: Q79.62

## 2015-08-16 LAB — BASIC METABOLIC PANEL
Anion gap: 8 (ref 5–15)
BUN: 12 mg/dL (ref 6–20)
CO2: 28 mmol/L (ref 22–32)
Calcium: 9.3 mg/dL (ref 8.9–10.3)
Chloride: 103 mmol/L (ref 101–111)
Creatinine, Ser: 0.86 mg/dL (ref 0.44–1.00)
GFR calc Af Amer: >60 mL/min (ref >60)
GFR calc non Af Amer: >60 mL/min (ref >60)
Glucose, Bld: 85 mg/dL (ref 65–99)
Potassium: 4.5 mmol/L (ref 3.5–5.1)
Sodium: 139 mmol/L (ref 135–145)

## 2015-08-16 LAB — CBC
HCT: 38.9 % (ref 36.0–46.0)
Hemoglobin: 12.9 g/dL (ref 12.0–15.0)
MCH: 26.8 pg (ref 26.0–34.0)
MCHC: 33.2 g/dL (ref 30.0–36.0)
MCV: 80.9 fL (ref 78.0–100.0)
Platelets: 399 10*3/uL (ref 150–400)
RBC: 4.81 MIL/uL (ref 3.87–5.11)
RDW: 14.6 % (ref 11.5–15.5)
WBC: 7.2 10*3/uL (ref 4.0–10.5)

## 2015-08-16 LAB — SURGICAL PCR SCREEN
MRSA, PCR: NEGATIVE
Staphylococcus aureus: NEGATIVE

## 2015-08-16 LAB — HCG, SERUM, QUALITATIVE: Preg, Serum: NEGATIVE

## 2015-08-16 LAB — ABO/RH: ABO/RH(D): O POS

## 2015-08-24 ENCOUNTER — Inpatient Hospital Stay (HOSPITAL_COMMUNITY): Payer: BLUE CROSS/BLUE SHIELD | Admitting: Anesthesiology

## 2015-08-24 ENCOUNTER — Encounter (HOSPITAL_COMMUNITY): Payer: Self-pay | Admitting: *Deleted

## 2015-08-24 ENCOUNTER — Inpatient Hospital Stay (HOSPITAL_COMMUNITY)
Admission: RE | Admit: 2015-08-24 | Discharge: 2015-08-25 | DRG: 470 | Disposition: A | Discharge status: Home Health | Payer: BLUE CROSS/BLUE SHIELD | Source: Ambulatory Visit | Attending: Orthopedic Surgery | Admitting: Orthopedic Surgery

## 2015-08-24 ENCOUNTER — Encounter (HOSPITAL_COMMUNITY)
Admission: RE | Disposition: A | Discharge status: Home Health | Payer: Self-pay | Source: Ambulatory Visit | Attending: Orthopedic Surgery

## 2015-08-24 ENCOUNTER — Inpatient Hospital Stay (HOSPITAL_COMMUNITY): Payer: BLUE CROSS/BLUE SHIELD

## 2015-08-24 DIAGNOSIS — Z96642 Presence of left artificial hip joint: Secondary | ICD-10-CM | POA: Diagnosis not present

## 2015-08-24 DIAGNOSIS — E669 Obesity, unspecified: Secondary | ICD-10-CM | POA: Diagnosis not present

## 2015-08-24 DIAGNOSIS — Z471 Aftercare following joint replacement surgery: Secondary | ICD-10-CM | POA: Diagnosis not present

## 2015-08-24 DIAGNOSIS — M1632 Unilateral osteoarthritis resulting from hip dysplasia, left hip: Secondary | ICD-10-CM | POA: Diagnosis not present

## 2015-08-24 DIAGNOSIS — M1612 Unilateral primary osteoarthritis, left hip: Secondary | ICD-10-CM | POA: Diagnosis not present

## 2015-08-24 DIAGNOSIS — Z6830 Body mass index (BMI) 30.0-30.9, adult: Secondary | ICD-10-CM | POA: Diagnosis not present

## 2015-08-24 DIAGNOSIS — Z96649 Presence of unspecified artificial hip joint: Secondary | ICD-10-CM

## 2015-08-24 DIAGNOSIS — Q6589 Other specified congenital deformities of hip: Secondary | ICD-10-CM | POA: Diagnosis not present

## 2015-08-24 DIAGNOSIS — S73002A Unspecified subluxation of left hip, initial encounter: Secondary | ICD-10-CM | POA: Diagnosis not present

## 2015-08-24 DIAGNOSIS — M1611 Unilateral primary osteoarthritis, right hip: Secondary | ICD-10-CM | POA: Diagnosis not present

## 2015-08-24 DIAGNOSIS — M25552 Pain in left hip: Secondary | ICD-10-CM | POA: Diagnosis not present

## 2015-08-24 HISTORY — PX: TOTAL HIP ARTHROPLASTY: SHX124

## 2015-08-24 LAB — TYPE AND SCREEN
ABO/RH(D): O POS
Antibody Screen: NEGATIVE

## 2015-08-24 SURGERY — ARTHROPLASTY, HIP, TOTAL, ANTERIOR APPROACH
Anesthesia: Spinal | Site: Hip | Laterality: Left

## 2015-08-24 MED ORDER — SODIUM CHLORIDE 0.9 % IV SOLN
INTRAVENOUS | Status: DC
  Administered 2015-08-24 – 2015-08-25 (×2): via INTRAVENOUS

## 2015-08-24 MED ORDER — SODIUM CHLORIDE 0.9 % IR SOLN
Status: DC | PRN
  Administered 2015-08-24: 1000 mL

## 2015-08-24 MED ORDER — FENTANYL CITRATE (PF) 100 MCG/2ML IJ SOLN
INTRAMUSCULAR | Status: AC
  Filled 2015-08-24: qty 2

## 2015-08-24 MED ORDER — MAGNESIUM CITRATE PO SOLN: 1.0000 | Freq: Once | ORAL | Status: DC | PRN

## 2015-08-24 MED ORDER — PHENOL 1.4 % MT LIQD: 1.0000 | OROMUCOSAL | Status: DC | PRN

## 2015-08-24 MED ORDER — VANCOMYCIN HCL IN DEXTROSE 1-5 GM/200ML-% IV SOLN
1000.0000 mg | Freq: Two times a day (BID) | INTRAVENOUS | Status: AC
  Administered 2015-08-25: 1000 mg via INTRAVENOUS
  Filled 2015-08-24: qty 200

## 2015-08-24 MED ORDER — ONDANSETRON HCL 4 MG/2ML IJ SOLN: 4.0000 mg | Freq: Four times a day (QID) | INTRAMUSCULAR | Status: DC | PRN

## 2015-08-24 MED ORDER — METOCLOPRAMIDE HCL 5 MG/ML IJ SOLN: 5.0000 mg | Freq: Three times a day (TID) | INTRAMUSCULAR | Status: DC | PRN

## 2015-08-24 MED ORDER — FENTANYL CITRATE (PF) 100 MCG/2ML IJ SOLN
INTRAMUSCULAR | Status: DC | PRN
  Administered 2015-08-24 (×4): 50 ug via INTRAVENOUS

## 2015-08-24 MED ORDER — ALUM & MAG HYDROXIDE-SIMETH 200-200-20 MG/5ML PO SUSP: 30.0000 mL | ORAL | Status: DC | PRN

## 2015-08-24 MED ORDER — ONDANSETRON HCL 4 MG/2ML IJ SOLN
INTRAMUSCULAR | Status: DC | PRN
  Administered 2015-08-24: 4 mg via INTRAVENOUS

## 2015-08-24 MED ORDER — METOCLOPRAMIDE HCL 5 MG/ML IJ SOLN: 10.0000 mg | Freq: Once | INTRAMUSCULAR | Status: DC | PRN

## 2015-08-24 MED ORDER — DEXAMETHASONE SODIUM PHOSPHATE 10 MG/ML IJ SOLN
INTRAMUSCULAR | Status: DC | PRN
  Administered 2015-08-24: 10 mg via INTRAVENOUS

## 2015-08-24 MED ORDER — HYDROMORPHONE HCL 1 MG/ML IJ SOLN
0.5000 mg | INTRAMUSCULAR | Status: DC | PRN
  Administered 2015-08-24: 1 mg via INTRAVENOUS
  Administered 2015-08-24: 0.5 mg via INTRAVENOUS
  Filled 2015-08-24 (×3): qty 1

## 2015-08-24 MED ORDER — ONDANSETRON HCL 4 MG/2ML IJ SOLN
INTRAMUSCULAR | Status: AC
  Filled 2015-08-24: qty 2

## 2015-08-24 MED ORDER — ASPIRIN EC 325 MG PO TBEC
325.0000 mg | DELAYED_RELEASE_TABLET | Freq: Two times a day (BID) | ORAL | Status: DC
  Administered 2015-08-25: 325 mg via ORAL
  Filled 2015-08-24 (×3): qty 1

## 2015-08-24 MED ORDER — SODIUM CHLORIDE 0.9 % IJ SOLN
INTRAMUSCULAR | Status: AC
  Filled 2015-08-24: qty 10

## 2015-08-24 MED ORDER — BUPIVACAINE HCL (PF) 0.5 % IJ SOLN
INTRAMUSCULAR | Status: AC
  Filled 2015-08-24: qty 30

## 2015-08-24 MED ORDER — SUMATRIPTAN SUCCINATE 6 MG/0.5ML ~~LOC~~ SOLN: 6.0000 mg | SUBCUTANEOUS | Status: DC | PRN

## 2015-08-24 MED ORDER — SERTRALINE HCL 50 MG PO TABS
50.0000 mg | ORAL_TABLET | Freq: Every day | ORAL | Status: DC
  Administered 2015-08-24: 50 mg via ORAL
  Filled 2015-08-24 (×2): qty 1

## 2015-08-24 MED ORDER — OXYCODONE HCL 5 MG PO TABS: 5.0000 mg | ORAL_TABLET | Freq: Once | ORAL | Status: DC | PRN

## 2015-08-24 MED ORDER — DOCUSATE SODIUM 100 MG PO CAPS
100.0000 mg | ORAL_CAPSULE | Freq: Two times a day (BID) | ORAL | Status: DC
  Administered 2015-08-24 – 2015-08-25 (×2): 100 mg via ORAL
  Filled 2015-08-24 (×3): qty 1

## 2015-08-24 MED ORDER — PHENYLEPHRINE HCL 10 MG/ML IJ SOLN
INTRAMUSCULAR | Status: DC | PRN
  Administered 2015-08-24 (×3): 40 ug via INTRAVENOUS

## 2015-08-24 MED ORDER — DEXAMETHASONE SODIUM PHOSPHATE 10 MG/ML IJ SOLN
INTRAMUSCULAR | Status: AC
  Filled 2015-08-24: qty 1

## 2015-08-24 MED ORDER — TRANEXAMIC ACID 1000 MG/10ML IV SOLN
1000.0000 mg | Freq: Once | INTRAVENOUS | Status: AC
  Administered 2015-08-24: 1000 mg via INTRAVENOUS
  Filled 2015-08-24: qty 10

## 2015-08-24 MED ORDER — PROPOFOL 500 MG/50ML IV EMUL
INTRAVENOUS | Status: DC | PRN
  Administered 2015-08-24: 50 ug/kg/min via INTRAVENOUS

## 2015-08-24 MED ORDER — BUPIVACAINE HCL (PF) 0.5 % IJ SOLN
INTRAMUSCULAR | Status: DC | PRN
  Administered 2015-08-24: 3 mL via INTRATHECAL

## 2015-08-24 MED ORDER — HYDROCODONE-ACETAMINOPHEN 7.5-325 MG PO TABS
1.0000 | ORAL_TABLET | ORAL | Status: DC
  Administered 2015-08-24: 2 via ORAL
  Administered 2015-08-24: 1 via ORAL
  Administered 2015-08-25 (×4): 2 via ORAL
  Filled 2015-08-24: qty 1
  Filled 2015-08-24 (×5): qty 2

## 2015-08-24 MED ORDER — DEXAMETHASONE SODIUM PHOSPHATE 10 MG/ML IJ SOLN
10.0000 mg | Freq: Once | INTRAMUSCULAR | Status: DC
  Filled 2015-08-24: qty 1

## 2015-08-24 MED ORDER — METHOCARBAMOL 1000 MG/10ML IJ SOLN
500.0000 mg | Freq: Four times a day (QID) | INTRAVENOUS | Status: DC | PRN
  Administered 2015-08-24: 500 mg via INTRAVENOUS
  Filled 2015-08-24: qty 5
  Filled 2015-08-24: qty 550

## 2015-08-24 MED ORDER — FENTANYL CITRATE (PF) 100 MCG/2ML IJ SOLN
25.0000 ug | INTRAMUSCULAR | Status: DC | PRN
  Administered 2015-08-24 (×2): 50 ug via INTRAVENOUS

## 2015-08-24 MED ORDER — MIDAZOLAM HCL 5 MG/5ML IJ SOLN
INTRAMUSCULAR | Status: DC | PRN
  Administered 2015-08-24: 2 mg via INTRAVENOUS

## 2015-08-24 MED ORDER — OXYCODONE HCL 5 MG/5ML PO SOLN: 5.0000 mg | Freq: Once | ORAL | Status: DC | PRN

## 2015-08-24 MED ORDER — FENTANYL CITRATE (PF) 100 MCG/2ML IJ SOLN
INTRAMUSCULAR | Status: AC
  Administered 2015-08-24: 50 ug via INTRAVENOUS
  Filled 2015-08-24: qty 2

## 2015-08-24 MED ORDER — MECLIZINE HCL 25 MG PO TABS: 25.0000 mg | ORAL_TABLET | Freq: Three times a day (TID) | ORAL | Status: DC | PRN

## 2015-08-24 MED ORDER — POLYETHYLENE GLYCOL 3350 17 G PO PACK
17.0000 g | PACK | Freq: Two times a day (BID) | ORAL | Status: DC
  Administered 2015-08-24 – 2015-08-25 (×2): 17 g via ORAL
  Filled 2015-08-24 (×4): qty 1

## 2015-08-24 MED ORDER — TRANEXAMIC ACID 1000 MG/10ML IV SOLN
1000.0000 mg | INTRAVENOUS | Status: AC
  Administered 2015-08-24: 1000 mg via INTRAVENOUS
  Filled 2015-08-24: qty 10

## 2015-08-24 MED ORDER — VANCOMYCIN HCL IN DEXTROSE 1-5 GM/200ML-% IV SOLN
1000.0000 mg | INTRAVENOUS | Status: AC
  Administered 2015-08-24: 1000 mg via INTRAVENOUS
  Filled 2015-08-24: qty 200

## 2015-08-24 MED ORDER — PHENYLEPHRINE 40 MCG/ML (10ML) SYRINGE FOR IV PUSH (FOR BLOOD PRESSURE SUPPORT)
PREFILLED_SYRINGE | INTRAVENOUS | Status: AC
  Filled 2015-08-24: qty 10

## 2015-08-24 MED ORDER — PROPOFOL 10 MG/ML IV BOLUS
INTRAVENOUS | Status: AC
  Filled 2015-08-24: qty 20

## 2015-08-24 MED ORDER — LACTATED RINGERS IV SOLN
INTRAVENOUS | Status: DC
  Administered 2015-08-24 (×3): via INTRAVENOUS

## 2015-08-24 MED ORDER — CELECOXIB 200 MG PO CAPS
200.0000 mg | ORAL_CAPSULE | Freq: Two times a day (BID) | ORAL | Status: DC
  Administered 2015-08-24 – 2015-08-25 (×2): 200 mg via ORAL
  Filled 2015-08-24 (×3): qty 1

## 2015-08-24 MED ORDER — DIPHENHYDRAMINE HCL 25 MG PO CAPS
25.0000 mg | ORAL_CAPSULE | Freq: Four times a day (QID) | ORAL | Status: DC | PRN
Start: 2015-08-24 — End: 2015-08-25

## 2015-08-24 MED ORDER — MIDAZOLAM HCL 2 MG/2ML IJ SOLN
INTRAMUSCULAR | Status: AC
  Filled 2015-08-24: qty 2

## 2015-08-24 MED ORDER — EPHEDRINE SULFATE 50 MG/ML IJ SOLN
INTRAMUSCULAR | Status: AC
  Filled 2015-08-24: qty 1

## 2015-08-24 MED ORDER — METOCLOPRAMIDE HCL 10 MG PO TABS: 5.0000 mg | ORAL_TABLET | Freq: Three times a day (TID) | ORAL | Status: DC | PRN

## 2015-08-24 MED ORDER — METOPROLOL SUCCINATE ER 25 MG PO TB24
25.0000 mg | ORAL_TABLET | Freq: Every day | ORAL | Status: DC
  Administered 2015-08-24: 25 mg via ORAL
  Filled 2015-08-24 (×2): qty 1

## 2015-08-24 MED ORDER — PROPOFOL 10 MG/ML IV BOLUS
INTRAVENOUS | Status: DC | PRN
  Administered 2015-08-24 (×2): 10 mg via INTRAVENOUS
  Administered 2015-08-24: 20 mg via INTRAVENOUS

## 2015-08-24 MED ORDER — METHOCARBAMOL 500 MG PO TABS
500.0000 mg | ORAL_TABLET | Freq: Four times a day (QID) | ORAL | Status: DC | PRN
  Administered 2015-08-25 (×3): 500 mg via ORAL
  Filled 2015-08-24 (×3): qty 1

## 2015-08-24 MED ORDER — MENTHOL 3 MG MT LOZG: 1.0000 | LOZENGE | OROMUCOSAL | Status: DC | PRN

## 2015-08-24 MED ORDER — FERROUS SULFATE 325 (65 FE) MG PO TABS
325.0000 mg | ORAL_TABLET | Freq: Three times a day (TID) | ORAL | Status: DC
  Administered 2015-08-25 (×2): 325 mg via ORAL
  Filled 2015-08-24 (×4): qty 1

## 2015-08-24 MED ORDER — BISACODYL 10 MG RE SUPP: 10.0000 mg | Freq: Every day | RECTAL | Status: DC | PRN

## 2015-08-24 MED ORDER — PROPOFOL 10 MG/ML IV BOLUS
INTRAVENOUS | Status: AC
  Filled 2015-08-24: qty 40

## 2015-08-24 MED ORDER — ONDANSETRON HCL 4 MG PO TABS: 4.0000 mg | ORAL_TABLET | Freq: Four times a day (QID) | ORAL | Status: DC | PRN

## 2015-08-24 SURGICAL SUPPLY — 35 items
BAG DECANTER FOR FLEXI CONT (MISCELLANEOUS) IMPLANT
BAG ZIPLOCK 12X15 (MISCELLANEOUS) IMPLANT
CAPT HIP TOTAL 2 ×2 IMPLANT
CLOTH BEACON ORANGE TIMEOUT ST (SAFETY) ×2 IMPLANT
COVER PERINEAL POST (MISCELLANEOUS) ×2 IMPLANT
DRAPE STERI IOBAN 125X83 (DRAPES) ×2 IMPLANT
DRAPE U-SHAPE 47X51 STRL (DRAPES) ×4 IMPLANT
DRESSING AQUACEL AG SP 3.5X10 (GAUZE/BANDAGES/DRESSINGS) ×1 IMPLANT
DRSG AQUACEL AG SP 3.5X10 (GAUZE/BANDAGES/DRESSINGS) ×2
DURAPREP 26ML APPLICATOR (WOUND CARE) ×2 IMPLANT
ELECT REM PT RETURN 15FT ADLT (MISCELLANEOUS) IMPLANT
ELECT REM PT RETURN 9FT ADLT (ELECTROSURGICAL) ×2
ELECTRODE REM PT RTRN 9FT ADLT (ELECTROSURGICAL) ×1 IMPLANT
GLOVE BIOGEL M STRL SZ7.5 (GLOVE) ×4 IMPLANT
GLOVE BIOGEL PI IND STRL 7.5 (GLOVE) ×6 IMPLANT
GLOVE BIOGEL PI IND STRL 8.5 (GLOVE) ×1 IMPLANT
GLOVE BIOGEL PI INDICATOR 7.5 (GLOVE) ×6
GLOVE BIOGEL PI INDICATOR 8.5 (GLOVE) ×1
GLOVE ECLIPSE 8.0 STRL XLNG CF (GLOVE) ×4 IMPLANT
GLOVE ORTHO TXT STRL SZ7.5 (GLOVE) ×2 IMPLANT
GOWN STRL REUS W/TWL LRG LVL3 (GOWN DISPOSABLE) ×4 IMPLANT
GOWN STRL REUS W/TWL XL LVL3 (GOWN DISPOSABLE) ×4 IMPLANT
HOLDER FOLEY CATH W/STRAP (MISCELLANEOUS) ×2 IMPLANT
LIQUID BAND (GAUZE/BANDAGES/DRESSINGS) ×2 IMPLANT
PACK ANTERIOR HIP CUSTOM (KITS) ×2 IMPLANT
SAW OSC TIP CART 19.5X105X1.3 (SAW) ×2 IMPLANT
SUT MNCRL AB 4-0 PS2 18 (SUTURE) ×2 IMPLANT
SUT VIC AB 1 CT1 36 (SUTURE) ×6 IMPLANT
SUT VIC AB 2-0 CT1 27 (SUTURE) ×3
SUT VIC AB 2-0 CT1 TAPERPNT 27 (SUTURE) ×3 IMPLANT
SUT VLOC 180 0 24IN GS25 (SUTURE) ×2 IMPLANT
TRAY FOLEY W/METER SILVER 14FR (SET/KITS/TRAYS/PACK) ×2 IMPLANT
TRAY FOLEY W/METER SILVER 16FR (SET/KITS/TRAYS/PACK) IMPLANT
WATER STERILE IRR 1500ML POUR (IV SOLUTION) ×2 IMPLANT
YANKAUER SUCT BULB TIP 10FT TU (MISCELLANEOUS) IMPLANT

## 2015-08-24 NOTE — Anesthesia Preprocedure Evaluation (Addendum)
Anesthesia Evaluation  Patient identified by MRN, date of birth, ID band Patient awake    Reviewed: Allergy & Precautions, H&P , NPO status , Patient's Chart, lab work & pertinent test results, reviewed documented beta blocker date and time   History of Anesthesia Complications Negative for: history of anesthetic complications  Airway Mallampati: II  TM Distance: >3 FB Neck ROM: full    Dental no notable dental hx.    Pulmonary neg pulmonary ROS,    Pulmonary exam normal breath sounds clear to auscultation       Cardiovascular negative cardio ROS Normal cardiovascular exam Rhythm:regular Rate:Normal     Neuro/Psych  Headaches, PSYCHIATRIC DISORDERS    GI/Hepatic negative GI ROS, Neg liver ROS,   Endo/Other  negative endocrine ROS  Renal/GU negative Renal ROS     Musculoskeletal  (+) Arthritis ,   Abdominal   Peds  Hematology negative hematology ROS (+)   Anesthesia Other Findings Patient has POTS, followed by cardiology, takes salt tablets, does not take minerolocorticoid  Her gastroparesis and POTS is doing very well right now, no syncopal episodes and no symptoms of delayed gastric emptying  Reproductive/Obstetrics Had her menstrual cycle yesterday                          Anesthesia Physical Anesthesia Plan  ASA: III  Anesthesia Plan: Spinal   Post-op Pain Management:    Induction: Intravenous  Airway Management Planned:   Additional Equipment:   Intra-op Plan:   Post-operative Plan:   Informed Consent: I have reviewed the patients History and Physical, chart, labs and discussed the procedure including the risks, benefits and alternatives for the proposed anesthesia with the patient or authorized representative who has indicated his/her understanding and acceptance.   Dental Advisory Given  Plan Discussed with: Anesthesiologist, CRNA and Surgeon  Anesthesia Plan  Comments: (No blood thinning meds, no back surgery in past)       Anesthesia Quick Evaluation

## 2015-08-24 NOTE — Anesthesia Procedure Notes (Addendum)
Spinal  Start time: 08/24/2015 3:29 PM End time: 08/24/2015 3:34 PM Staffing Harrison: Lyda KalataJARVELA, Angela Harrison  Preanesthetic Checklist Completed: patient identified, site marked, surgical consent, pre-op evaluation, timeout performed, IV checked, risks and benefits discussed and monitors and equipment checked Spinal Block Patient position: sitting Prep: Betadine Patient monitoring: heart rate Approach: midline Location: L3-4 Injection technique: single-shot Needle Needle type: Pencan  Needle gauge: 25 G Needle length: 9 cm Needle insertion depth: 7 cm Additional Notes Lot [id number] Exp 2016-10-24

## 2015-08-24 NOTE — Interval H&P Note (Signed)
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 After consideration of risks, benefits and other options for treatment, the patient has consented to  Procedure(s): LEFT TOTAL HIP ARTHROPLASTY ANTERIOR APPROACH (Left) as a surgical intervention .  The patient's history has been reviewed, patient examined, no change in status, stable for surgery.  I have reviewed the patient's chart and labs.  Questions were answered to the patient's satisfaction.     Shelda PalLIN,Caliope Ruppert D

## 2015-08-24 NOTE — Op Note (Signed)
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      SURGEON:  Madlyn FrankelMatthew D. Charlann Boxerlin, M.D.      ASSISTANT:  Skip MayerBlair Roberts, PA-C     ANESTHESIA:  Spinal.      SPECIMENS:  None.      COMPLICATIONS:  None.      BLOOD LOSS:  650 cc     DRAINS:  None      INDICATION OF THE PROCEDURE:  Angela Harrison is a 40 y.o. female who had   presented to office for evaluation of left hip pain.  Radiographs revealed   progressive degenerative changes with bone-on-bone   articulation to the  hip joint with lateral joint subluxation due to dysplasia.  The patient had painful limited range of   motion significantly affecting their overall quality of life.  The patient was failing to    respond to conservative measures, and at this point was ready   to proceed with more definitive measures.  The patient has noted progressive   degenerative changes in his hip, progressive problems and dysfunction   with regarding the hip prior to surgery.  Consent was obtained for   benefit of pain relief.  Specific risk of infection, DVT, component   failure, dislocation, need for revision surgery, as well discussion of   the anterior versus posterior approach were reviewed.  Consent was   obtained for benefit of anterior pain relief through an anterior   approach.       PROCEDURE IN DETAIL:  The patient was brought to operative theater.   Once adequate anesthesia, preoperative antibiotics, 2 gm of Ancef, 1 gm of Tranexamic Acid, and 10 mg of Decadron administered.   The patient was positioned supine on the OSI Hanna table.  Once adequate   padding of boney process was carried out, we had predraped out the hip, and  used fluoroscopy to confirm orientation of the pelvis and position.      The left hip was then prepped and draped from proximal iliac crest to   mid thigh with shower curtain technique.      Time-out was performed identifying the patient, planned procedure, and   extremity.     An incision was then made 2 cm distal and lateral to the   anterior superior iliac spine extending over the orientation of the   tensor fascia lata muscle and sharp dissection was carried down to the   fascia of the  muscle and protractor placed in the soft tissues.      The fascia was then incised.  The muscle belly was identified and swept   laterally and retractor placed along the superior neck.  Following   cauterization of the circumflex vessels and removing some pericapsular   fat, a second cobra retractor was placed on the inferior neck.  A third   retractor was placed on the anterior acetabulum after elevating the   anterior rectus.  A L-capsulotomy was along the line of the   superior neck to the trochanteric fossa, then extended proximally and   distally.  Tag sutures were placed and the retractors were then placed   intracapsular.  We then identified the trochanteric fossa and   orientation of my neck cut, confirmed this radiographically   and then made a neck osteotomy with the femur on traction.  The femoral   head was removed without difficulty or complication.  Traction was let   off and retractors were placed posterior and anterior around the   acetabulum.      The labrum and foveal tissue were debrided.  I began reaming with a 44mm   reamer and  reamed up to 51mm reamer with good bony bed preparation and a 52mm   cup was chosen.  The final 52mm Pinnacle cup was then impacted under fluoroscopy  to confirm the depth of penetration and orientation with respect to   abduction.  A screw was placed followed by the hole eliminator.  The final   36 neutral Ceramax ceramic liner was impacted with good visualized rim fit.  The cup was positioned anatomically within the acetabular portion of the pelvis.      At this point, the femur was rolled at 80 degrees.  Further capsule was   released off the inferior aspect of the femoral neck.  I then   released the superior capsule proximally.  The hook was placed laterally   along the femur and elevated manually and held in position with the bed   hook.  The leg was then extended and adducted with the leg rolled to 100   degrees of external rotation.  Once the proximal femur was fully   exposed, I used a box osteotome to set orientation.  I then began   broaching with the starting chili pepper broach and passed this by hand and then broached up to 4.  With the 4 broach in place I chose a high offset neck and did several trial reductions.  The offset was appropriate, leg lengths   appeared to be equal, confirmed radiographically.   Given these findings, I went ahead and dislocated the hip, repositioned all   retractors and positioned the right hip in the extended and abducted position.  The final 4 Hi Tri Lock stem was   chosen and it was impacted down to the level of neck cut.  Based on this   and the trial reduction, a 36+1.5 delta ceramic ball was chosen and   impacted onto a clean and dry trunnion, and the hip was reduced.  The   hip had been irrigated throughout the case again at this point.  I did   reapproximate the superior capsular leaflet to the anterior leaflet   using #1 Vicryl.  The fascia of the   tensor fascia lata muscle was then reapproximated using #1 Vicryl and #0 V-lock sutures.  The    remaining wound was closed with 2-0 Vicryl and running  4-0 Monocryl.   The hip was cleaned, dried, and dressed sterilely using Dermabond and   Aquacel dressing.  She was then brought   to recovery room in stable condition tolerating the procedure well.    Skip Mayer, PA-C was present for the entirety of the case involved from   preoperative positioning, perioperative retractor management, general   facilitation of the case, as well as primary wound closure as assistant.            Madlyn Frankel Charlann Boxer, M.D.        08/24/2015 5:10 PM

## 2015-08-24 NOTE — Transfer of Care (Signed)
Immediate Anesthesia Transfer of Care Note  Patient: Angela BrilliantJennifer F Harrison  Procedure(s) Performed: Procedure(s): LEFT TOTAL HIP ARTHROPLASTY ANTERIOR APPROACH (Left)  Patient Location: PACU  Anesthesia Type:Spinal  Level of Consciousness:  sedated, patient cooperative and responds to stimulation  Airway & Oxygen Therapy:Patient Spontanous Breathing and Patient connected to face mask oxgen  Post-op Assessment:  Report given to PACU RN and Post -op Vital signs reviewed and stable  Post vital signs:  Reviewed and stable, spinal L2  Last Vitals:  Filed Vitals:   08/24/15 1321  BP: 135/98  Pulse: 92  Temp: 36.6 C  Resp: 16    Complications: No apparent anesthesia complications

## 2015-08-25 ENCOUNTER — Encounter (HOSPITAL_COMMUNITY): Payer: Self-pay | Admitting: Orthopedic Surgery

## 2015-08-25 DIAGNOSIS — E669 Obesity, unspecified: Secondary | ICD-10-CM | POA: Diagnosis present

## 2015-08-25 LAB — BASIC METABOLIC PANEL
Anion gap: 4 — ABNORMAL LOW (ref 5–15)
BUN: 9 mg/dL (ref 6–20)
CO2: 27 mmol/L (ref 22–32)
Calcium: 8.3 mg/dL — ABNORMAL LOW (ref 8.9–10.3)
Chloride: 103 mmol/L (ref 101–111)
Creatinine, Ser: 0.66 mg/dL (ref 0.44–1.00)
GFR calc Af Amer: >60 mL/min (ref >60)
GFR calc non Af Amer: >60 mL/min (ref >60)
Glucose, Bld: 160 mg/dL — ABNORMAL HIGH (ref 65–99)
Potassium: 4.3 mmol/L (ref 3.5–5.1)
Sodium: 134 mmol/L — ABNORMAL LOW (ref 135–145)

## 2015-08-25 LAB — CBC
HCT: 30 % — ABNORMAL LOW (ref 36.0–46.0)
Hemoglobin: 9.9 g/dL — ABNORMAL LOW (ref 12.0–15.0)
MCH: 27.2 pg (ref 26.0–34.0)
MCHC: 33 g/dL (ref 30.0–36.0)
MCV: 82.4 fL (ref 78.0–100.0)
Platelets: 355 10*3/uL (ref 150–400)
RBC: 3.64 MIL/uL — ABNORMAL LOW (ref 3.87–5.11)
RDW: 14.7 % (ref 11.5–15.5)
WBC: 10 10*3/uL (ref 4.0–10.5)

## 2015-08-25 MED ORDER — FERROUS SULFATE 325 (65 FE) MG PO TABS: 325.0000 mg | ORAL_TABLET | Freq: Three times a day (TID) | ORAL | Status: DC

## 2015-08-25 MED ORDER — ASPIRIN 325 MG PO TBEC: 325.0000 mg | DELAYED_RELEASE_TABLET | Freq: Two times a day (BID) | ORAL | Status: AC

## 2015-08-25 MED ORDER — HYDROCODONE-ACETAMINOPHEN 7.5-325 MG PO TABS: 1.0000 | ORAL_TABLET | ORAL | Status: DC | PRN

## 2015-08-25 MED ORDER — DOCUSATE SODIUM 100 MG PO CAPS
100.0000 mg | ORAL_CAPSULE | Freq: Two times a day (BID) | ORAL | Status: DC
Start: 2015-08-25 — End: 2016-12-14

## 2015-08-25 MED ORDER — POLYETHYLENE GLYCOL 3350 17 G PO PACK: 17.0000 g | PACK | Freq: Two times a day (BID) | ORAL | Status: DC

## 2015-08-25 MED ORDER — CYCLOBENZAPRINE HCL 10 MG PO TABS: 10.0000 mg | ORAL_TABLET | Freq: Three times a day (TID) | ORAL | Status: DC | PRN

## 2015-08-25 NOTE — Progress Notes (Signed)
Discharge instructions discussed with patient and family, verbalized understanding and agreement.  Prescription given to patient

## 2015-08-25 NOTE — Progress Notes (Signed)
Physical Therapy Treatment Patient Details Name: Angela BrilliantJennifer F Overall MRN: [id number] DOB: 02/26/1976 Today's Date: 08/25/2015    History of Present Illness L DATHA    PT Comments    The patient is progressing very well, has practiced steps and ready for DC. Will get a leg lifter for home.  Follow Up Recommendations  Home health PT;Supervision/Assistance - 24 hour     Equipment Recommendations  None recommended by PT    Recommendations for Other Services       Precautions / Restrictions Precautions Precautions: Fall Restrictions Weight Bearing Restrictions: No Other Position/Activity Restrictions: WBAT    Mobility  Bed Mobility Overal bed mobility: Modified Independent Bed Mobility: Sit to Supine     Supine to sit: Mod assist Sit to supine: Mod assist;HOB elevated   General bed mobility comments: using leg lifter,   Transfers Overall transfer level: Needs assistance Equipment used: Rolling walker (2 wheeled) Transfers: Sit to/from Stand Sit to Stand: Supervision         General transfer comment: cues for L leg position and hand position  Ambulation/Gait Ambulation/Gait assistance: Supervision Ambulation Distance (Feet): 440 Feet Assistive device: Rolling walker (2 wheeled) Gait Pattern/deviations: Step-through pattern     General Gait Details: cues for sequence and posture.   Stairs Stairs: Yes Stairs assistance: Min guard Stair Management: Two rails;Step to pattern;Forwards Number of Stairs: 0    Wheelchair Mobility    Modified Rankin (Stroke Patients Only)       Balance                                    Cognition Arousal/Alertness: Awake/alert Behavior During Therapy: Anxious Overall Cognitive Status: Within Functional Limits for tasks assessed                      Exercises Total Joint Exercises Ankle Circles/Pumps: AROM;Both;10 reps;Supine Quad Sets: AROM;Both;10 reps;Supine Short Arc Quad: AROM;Left;10  reps;Supine Heel Slides: AROM;Left;10 reps;Supine Hip ABduction/ADduction: AAROM;Left;10 reps;Supine Long Arc Quad: AROM;Seated;Left;10 reps;Supine Marching in Standing: AAROM;Seated;Left;10 reps    General Comments        Pertinent Vitals/Pain Pain Assessment: 0-10 Pain Score: 4  Pain Location: L thigh Pain Descriptors / Indicators: Aching Pain Intervention(s): Monitored during session;Premedicated before session;Ice applied;Repositioned    Home Living Family/patient expects to be discharged to:: Private residence Living Arrangements: Spouse/significant other Available Help at Discharge: Family Type of Home: House Home Access: Stairs to enter Entrance Stairs-Rails: Right;Left Home Layout: One level Home Equipment: Environmental consultantWalker - 2 wheels;Bedside commode;Tub bench;Adaptive equipment      Prior Function Level of Independence: Independent          PT Goals (current goals can now be found in the care plan section) Acute Rehab PT Goals Patient Stated Goal: to go home today PT Goal Formulation: With patient/family Time For Goal Achievement: 08/27/15 Potential to Achieve Goals: Good Progress towards PT goals: Progressing toward goals    Frequency  7X/week    PT Plan Current plan remains appropriate    Co-evaluation             End of Session   Activity Tolerance: Patient tolerated treatment well Patient left: in bed;with call bell/phone within reach;with family/visitor present     Time: 9562-13081316-1341 PT Time Calculation (min) (ACUTE ONLY): 25 min  Charges:  $Gait Training: 8-22 mins $Therapeutic Exercise: 8-22 mins  G Codes:      Rada Hay 08/25/2015, 1:49 PM

## 2015-08-25 NOTE — Progress Notes (Signed)
Occupational Therapy Evaluation Patient Details Name: Angela BrilliantJennifer F Harrison MRN: [id number] DOB: 11/07/1975 Today's Date: 08/25/2015    History of Present Illness L DATHA   Clinical Impression   All OT education completed and pt questions answered. No further OT needs; will sign off.    Follow Up Recommendations  No OT follow up;Supervision/Assistance - 24 hour    Equipment Recommendations  None recommended by OT    Recommendations for Other Services       Precautions / Restrictions Precautions Precautions: Fall Restrictions Weight Bearing Restrictions: No Other Position/Activity Restrictions: WBAT      Mobility Bed Mobility Overal bed mobility: Needs Assistance Bed Mobility: Sit to Supine      Sit to supine: Mod assist;HOB elevated   General bed mobility comments: using leg lifter, but still needed assistance of therapist  Transfers Overall transfer level: Needs assistance Equipment used: Rolling walker (2 wheeled) Transfers: Sit to/from Stand Sit to Stand: Min guard;From elevated surface;Min assist         General transfer comment: cues for L leg position and hand position    Balance                                            ADL Overall ADL's : Needs assistance/impaired Eating/Feeding: Independent;Sitting   Grooming: Wash/dry hands;Supervision/safety;Standing   Upper Body Bathing: Set up;Sitting   Lower Body Bathing: Moderate assistance;Sit to/from stand   Upper Body Dressing : Set up;Sitting   Lower Body Dressing: Moderate assistance;Sit to/from stand   Toilet Transfer: Min guard;Ambulation;BSC;RW   Toileting- ArchitectClothing Manipulation and Hygiene: Min guard;Sit to/from stand       Functional mobility during ADLs: Min guard;Rolling walker General ADL Comments: Patient practiced ambulation recliner to bathroom with 3 in 1 over toilet using RW. After toileting task, patient wanted to return to bed. Assisted back to bed and ice  applied. Patient practiced using leg lifter to get LLE into bed but still had great difficulty and needed assistance from therapist. Patient verbally educated on tub transfer setup and transfer technique. Patient also educated on use of reacher to don pants/underwear. She has [employer] Sports administratorreacher at home. Her husband stated he can also assist patient. Patient limited by pain with all mobility. Educated that pain be her guide for ADLs.      Vision     Perception     Praxis      Pertinent Vitals/Pain Pain Assessment: 0-10 Pain Score: 7  Pain Location: L thigh Pain Descriptors / Indicators: Aching;Grimacing;Sore Pain Intervention(s): Limited activity within patient's tolerance;Monitored during session;Repositioned;Ice applied     Hand Dominance     Extremity/Trunk Assessment Upper Extremity Assessment Upper Extremity Assessment: Overall WFL for tasks assessed   Lower Extremity Assessment Lower Extremity Assessment: Defer to PT evaluation    Cervical / Trunk Assessment Cervical / Trunk Assessment: Normal   Communication Communication Communication: No difficulties   Cognition Arousal/Alertness: Awake/alert Behavior During Therapy: Anxious Overall Cognitive Status: Within Functional Limits for tasks assessed                     General Comments       Exercises       Shoulder Instructions      Home Living Family/patient expects to be discharged to:: Private residence Living Arrangements: Spouse/significant other Available Help at Discharge: Family Type of Home: House Home Access:  Stairs to enter [employer] of Steps: 4 Entrance Stairs-Rails: Right;Left Home Layout: One level     Bathroom Shower/Tub: Tub/shower unit;Walk-in shower Shower/tub characteristics: Engineer, building services: Standard Bathroom Accessibility: Yes How Accessible: Accessible via walker Home Equipment: Walker - 2 wheels;Bedside commode;Tub bench;Adaptive equipment Adaptive  Equipment: Reacher        Prior Functioning/Environment Level of Independence: Independent             OT Diagnosis: Acute pain   OT Problem List: Decreased strength;Decreased range of motion;Decreased activity tolerance;Decreased knowledge of use of DME or AE;Pain   OT Treatment/Interventions:      OT Goals(Current goals can be found in the care plan section) Acute Rehab OT Goals Patient Stated Goal: to go home today OT Goal Formulation: All assessment and education complete, DC therapy  OT Frequency:     Barriers to D/C:            Co-evaluation              End of Session Equipment Utilized During Treatment: Rolling walker  Activity Tolerance: Patient tolerated treatment well Patient left: in bed;with call bell/phone within reach   Time: 1111-1137 OT Time Calculation (min): 26 min Charges:  OT General Charges $OT Visit: 1 Procedure OT Evaluation $OT Eval Low Complexity: 1 Procedure OT Treatments $Self Care/Home Management : 8-22 mins G-Codes:    Angela Harrison 09-04-2015, 12:40 PM

## 2015-08-25 NOTE — Discharge Instructions (Signed)

## 2015-08-25 NOTE — Care Management Note (Signed)
Case Management Note  Patient Details  Name: Angela Harrison MRN: [id number] Date of Birth: 02/07/1976  Subjective/Objective:  S/p Left total hip replacement through an anterior approach                  Action/Plan: Discharge planning, spoke with patient and spouse at bedside. [facility] Contacted [facility] for referral. Has RW and 3-n-1.  Expected Discharge Date:  08/26/15               Expected Discharge Plan:  Home w Home Health Services  In-House Referral:  NA  Discharge planning Services  CM Consult  Post Acute Care Choice:  Home Health Choice offered to:  Patient  DME Arranged:  N/A DME Agency:  NA  HH Arranged:  PT [facility]  Status of Service:  Completed, signed off  Medicare Important Message Given:    Date Medicare IM Given:    Medicare IM give by:    Date Additional Medicare IM Given:    Additional Medicare Important Message give by:     If discussed at Long Length of Stay Meetings, dates discussed:    Additional Comments:  Alexis Goodelleele, Sylvester Minton K, RN 08/25/2015, 10:06 AM [phone]

## 2015-08-25 NOTE — Progress Notes (Signed)
     Subjective: 1 Day Post-Op Procedure(s) (LRB): LEFT TOTAL HIP ARTHROPLASTY ANTERIOR APPROACH (Left)   Patient reports pain as mild, pain controlled. No events throughout the night. Ready to work to get this hip better.  Ready to be discharged home if she does well with PT.  Objective:   VITALS:   Filed Vitals:   08/25/15 0204 08/25/15 0500  BP: 102/57 94/62  Pulse: 67 59  Temp: 97.5 F (36.4 C) 97.8 F (36.6 C)  Resp: 16 18    Dorsiflexion/Plantar flexion intact Incision: dressing C/D/I No cellulitis present Compartment soft  LABS  Recent Labs  08/25/15 0406  HGB 9.9*  HCT 30.0*  WBC 10.0  PLT 355     Recent Labs  08/25/15 0406  NA 134*  K 4.3  BUN 9  CREATININE 0.66  GLUCOSE 160*     Assessment/Plan: 1 Day Post-Op Procedure(s) (LRB): LEFT TOTAL HIP ARTHROPLASTY ANTERIOR APPROACH (Left) Foley cath d/c'ed  Advance diet Up with therapy D/C IV fluids Discharge home with home health  [facility]. Follow up with OLIN,Ellana Kawa D in 2 weeks.  Contact information:  [facility] [address], [address] [place] [id number] [phone]    Obese (BMI 30-39.9) Estimated body mass index is 30.71 kg/(m^2) as calculated from the following:   Height as of this encounter: 5\' 10"  (1.778 m).   Weight as of this encounter: 97.07 kg (214 lb). Patient also counseled that weight may inhibit the healing process Patient counseled that losing weight will help with future health issues         Anastasio AuerbachMatthew S. Ismahan Lippman   PAC  08/25/2015, 8:46 AM

## 2015-08-25 NOTE — Evaluation (Signed)
Physical Therapy Evaluation Patient Details Name: Angela Harrison MRN: [id number] DOB: March 22, 1976 Today's Date: 08/25/2015   History of Present Illness  L DATHA  Clinical Impression  The  Patient  Ec/o increased pain during bed mobility more than ambulating. The patient will benefit from PT to address the problems listed in the note below/    Follow Up Recommendations Home health PT;Supervision/Assistance - 24 hour    Equipment Recommendations  None recommended by PT    Recommendations for Other Services       Precautions / Restrictions Precautions Precautions: Fall Restrictions Weight Bearing Restrictions: No      Mobility  Bed Mobility Overal bed mobility: Needs Assistance Bed Mobility: Supine to Sit     Supine to sit: Mod assist     General bed mobility comments: used sheet around the  foot to self assist the L leg to edge, extra time to  move the leg, cues for trunk position, did usee the rail .  Transfers Overall transfer level: Needs assistance Equipment used: Rolling walker (2 wheeled) Transfers: Sit to/from Stand Sit to Stand: Min assist;From elevated surface         General transfer comment: cues for L leg position and hand position  Ambulation/Gait Ambulation/Gait assistance: Min assist Ambulation Distance (Feet): 110 Feet Assistive device: Rolling walker (2 wheeled) Gait Pattern/deviations: Step-to pattern;Decreased step length - left;Decreased stance time - left     General Gait Details: cues for sequence and posture.  Stairs            Wheelchair Mobility    Modified Rankin (Stroke Patients Only)       Balance                                             Pertinent Vitals/Pain Pain Assessment: 0-10 Pain Score: 7  Pain Location: L thigh Pain Descriptors / Indicators: Aching;Tightness;Radiating;Sharp Pain Intervention(s): Limited activity within patient's tolerance;Monitored during session;Premedicated  before session;Repositioned;Ice applied    Home Living Family/patient expects to be discharged to:: Private residence Living Arrangements: Spouse/significant other Available Help at Discharge: Family Type of Home: House Home Access: Stairs to enter Entrance Stairs-Rails: Doctor, general practice of Steps: 4 Home Layout: One level Home Equipment: Environmental consultant - 2 wheels;Bedside commode;Tub bench      Prior Function Level of Independence: Independent               Hand Dominance        Extremity/Trunk Assessment   Upper Extremity Assessment: Defer to OT evaluation           Lower Extremity Assessment: LLE deficits/detail   LLE Deficits / Details: some difficulty advancing the L eft leg first. did better  with R leg fiers  Cervical / Trunk Assessment: Normal  Communication   Communication: No difficulties  Cognition Arousal/Alertness: Awake/alert Behavior During Therapy: Anxious Overall Cognitive Status: Within Functional Limits for tasks assessed                      General Comments      Exercises Total Joint Exercises Ankle Circles/Pumps: AROM;Both;10 reps;Supine Heel Slides: AAROM;Left;10 reps;Supine      Assessment/Plan    PT Assessment Patient needs continued PT services  PT Diagnosis Difficulty walking;Acute pain   PT Problem List Decreased strength;Decreased range of motion;Decreased activity tolerance;Decreased mobility;Decreased knowledge of precautions;Decreased safety awareness;Decreased  knowledge of use of DME;Pain  PT Treatment Interventions DME instruction;Gait training;Stair training;Functional mobility training;Therapeutic activities;Therapeutic exercise;Patient/family education   PT Goals (Current goals can be found in the Care Plan section) Acute Rehab PT Goals Patient Stated Goal: to go home today PT Goal Formulation: With patient/family Time For Goal Achievement: 08/27/15 Potential to Achieve Goals: Good    Frequency  7X/week   Barriers to discharge        Co-evaluation               End of Session   Activity Tolerance: Patient tolerated treatment well Patient left: in chair;with call bell/phone within reach Nurse Communication: Mobility status         Time: 0950-1016 PT Time Calculation (min) (ACUTE ONLY): 26 min   Charges:   PT Evaluation $PT Eval Low Complexity: 1 Procedure PT Treatments $Gait Training: 8-22 mins   PT G Codes:        Rada HayHill, Aravind Chrismer Elizabeth 08/25/2015, 10:58 AM Blanchard KelchKaren Kincaid Tiger PT [phone]

## 2015-08-26 DIAGNOSIS — Z96642 Presence of left artificial hip joint: Secondary | ICD-10-CM | POA: Diagnosis not present

## 2015-08-26 DIAGNOSIS — Z471 Aftercare following joint replacement surgery: Secondary | ICD-10-CM | POA: Diagnosis not present

## 2015-08-26 DIAGNOSIS — E669 Obesity, unspecified: Secondary | ICD-10-CM | POA: Diagnosis not present

## 2015-08-26 DIAGNOSIS — Z6830 Body mass index (BMI) 30.0-30.9, adult: Secondary | ICD-10-CM | POA: Diagnosis not present

## 2015-08-27 DIAGNOSIS — Z471 Aftercare following joint replacement surgery: Secondary | ICD-10-CM | POA: Diagnosis not present

## 2015-08-27 DIAGNOSIS — E669 Obesity, unspecified: Secondary | ICD-10-CM | POA: Diagnosis not present

## 2015-08-27 DIAGNOSIS — Z6830 Body mass index (BMI) 30.0-30.9, adult: Secondary | ICD-10-CM | POA: Diagnosis not present

## 2015-08-27 DIAGNOSIS — Z96642 Presence of left artificial hip joint: Secondary | ICD-10-CM | POA: Diagnosis not present

## 2015-08-30 DIAGNOSIS — Z6830 Body mass index (BMI) 30.0-30.9, adult: Secondary | ICD-10-CM | POA: Diagnosis not present

## 2015-08-30 DIAGNOSIS — Z471 Aftercare following joint replacement surgery: Secondary | ICD-10-CM | POA: Diagnosis not present

## 2015-08-30 DIAGNOSIS — Z96642 Presence of left artificial hip joint: Secondary | ICD-10-CM | POA: Diagnosis not present

## 2015-08-30 DIAGNOSIS — E669 Obesity, unspecified: Secondary | ICD-10-CM | POA: Diagnosis not present

## 2015-08-30 NOTE — Anesthesia Postprocedure Evaluation (Signed)
Anesthesia Post Note  Patient: Ulice BrilliantJennifer F Harbeck  Procedure(s) Performed: Procedure(s) (LRB): LEFT TOTAL HIP ARTHROPLASTY ANTERIOR APPROACH (Left)  Patient location during evaluation: PACU Anesthesia Type: Spinal Level of consciousness: oriented and awake and alert Pain management: pain level controlled Vital Signs Assessment: post-procedure vital signs reviewed and stable Respiratory status: spontaneous breathing, respiratory function stable and patient connected to nasal cannula oxygen Cardiovascular status: blood pressure returned to baseline and stable Postop Assessment: no headache, no backache and spinal receding Anesthetic complications: no    Last Vitals:  Filed Vitals:   08/25/15 0500 08/25/15 0947  BP: 94/62 107/67  Pulse: 59 65  Temp: 36.6 C 36.3 C  Resp: 18 18    Last Pain:  Filed Vitals:   08/25/15 1207  PainSc: 4                  Reino KentJudd, Dimitrios Balestrieri J

## 2015-08-30 NOTE — Discharge Summary (Signed)
Physician Discharge Summary  Patient ID: Angela Harrison MRN: [id number] DOB/AGE: 41/03/1975 41 y.o.  Admit date: 08/24/2015 Discharge date: 08/25/2015   Procedures:  Procedure(s) (LRB): LEFT TOTAL HIP ARTHROPLASTY ANTERIOR APPROACH (Left)  Attending Physician:  Dr. Durene RomansMatthew Olin   Admission Diagnoses:   Left hip primary OA / pain, secondary to dysplasia  Discharge Diagnoses:  Principal Problem:   S/P left THA, AA Active Problems:   Obese  Past Medical History  Diagnosis Date  . Gastric paresis   . Migraine   . Syncope     neurocardiogenic takes toprol and zoloft for  . Arthritis     oa  . Ehlers-Danlos syndrome type III     HPI:    Angela Harrison, 40 y.o. female, has a history of pain and functional disability in the left hip(s) due to arthritis and patient has failed non-surgical conservative treatments for greater than 12 weeks to include NSAID's and/or analgesics, corticosteriod injections and activity modification. Onset of symptoms was abrupt starting in January 2016 years ago with rapidlly worsening course since that time.The patient noted no past surgery on the left hip(s). Patient currently rates pain in the left hip at 9 out of 10 with activity. Patient has night pain, worsening of pain with activity and weight bearing, trendelenberg gait, pain that interfers with activities of daily living and pain with passive range of motion. Patient has evidence of periarticular osteophytes, joint space narrowing and hip dysplasia by imaging studies. This condition presents safety issues increasing the risk of falls. There is no current active infection. Risks, benefits and expectations were discussed with the patient. Risks including but not limited to the risk of anesthesia, blood clots, nerve damage, blood vessel damage, failure of the prosthesis, infection and up to and including death. Patient understand the risks, benefits and expectations and wishes to proceed with  surgery.   PCP: Farris HasMORROW, AARON, MD   Discharged Condition: good  Hospital Course:  Patient underwent the above stated procedure on 08/24/2015. Patient tolerated the procedure well and brought to the recovery room in good condition and subsequently to the floor.  POD #1 BP: 94/62 ; Pulse: 59 ; Temp: 97.8 F (36.6 C) ; Resp: 18 Patient reports pain as mild, pain controlled. No events throughout the night. Ready to work to get this hip better. Ready to be discharged home. Dorsiflexion/plantar flexion intact, incision: dressing C/D/I, no cellulitis present and compartment soft.   LABS  Basename    HGB     9.9  HCT     30.0    Discharge Exam: General appearance: alert, cooperative and no distress Extremities: Homans sign is negative, no sign of DVT, no edema, redness or tenderness in the calves or thighs and no ulcers, gangrene or trophic changes  Disposition: Home with follow up in 2 weeks   Follow-up Information    Follow up with Shelda PalLIN,Quavion Boule D, MD. Schedule an appointment as soon as possible for a visit in 2 weeks.   Specialty:  Orthopedic Surgery   Contact information:   [address] [place] [address] [phone]       [facility]   Why:  physical therapy   Contact information:   [address] [place] [address] [phone]       Discharge Instructions    Call MD / Call 911    Complete by:  As directed   If you experience chest pain or shortness of  breath, CALL 911 and be transported to the hospital emergency room.  If you develope a fever above 101 F, pus (white drainage) or increased drainage or redness at the wound, or calf pain, call your surgeon's office.     Change dressing    Complete by:  As directed   Maintain surgical dressing until follow up in the clinic. If the edges start to pull up, may reinforce with tape. If the dressing is no longer working, may remove and cover with gauze and tape, but  must keep the area dry and clean.  Call with any questions or concerns.     Constipation Prevention    Complete by:  As directed   Drink plenty of fluids.  Prune juice may be helpful.  You may use a stool softener, such as Colace (over the counter) 100 mg twice a day.  Use MiraLax (over the counter) for constipation as needed.     Diet - low sodium heart healthy    Complete by:  As directed      Discharge instructions    Complete by:  As directed   Maintain surgical dressing until follow up in the clinic. If the edges start to pull up, may reinforce with tape. If the dressing is no longer working, may remove and cover with gauze and tape, but must keep the area dry and clean.  Follow up in 2 weeks at [facility]. Call with any questions or concerns.     Increase activity slowly as tolerated    Complete by:  As directed   Weight bearing as tolerated with assist device (walker, cane, etc) as directed, use it as long as suggested by your surgeon or therapist, typically at least 4-6 weeks.     TED hose    Complete by:  As directed   Use stockings (TED hose) for 2 weeks on both leg(s).  You may remove them at night for sleeping.             Medication List    STOP taking these medications        ADVIL 200 MG tablet  Generic drug:  ibuprofen     HYDROcodone-acetaminophen 5-325 MG tablet  Commonly known as:  NORCO/VICODIN  Replaced by:  HYDROcodone-acetaminophen 7.5-325 MG tablet      TAKE these medications        aspirin 325 MG EC tablet  Take 1 tablet (325 mg total) by mouth 2 (two) times daily.     cyclobenzaprine 10 MG tablet  Commonly known as:  FLEXERIL  Take 1 tablet (10 mg total) by mouth 3 (three) times daily as needed for muscle spasms.     docusate sodium 100 MG capsule  Commonly known as:  COLACE  Take 1 capsule (100 mg total) by mouth 2 (two) times daily.     ferrous sulfate 325 (65 FE) MG tablet  Take 1 tablet (325 mg total) by mouth 3 (three) times  daily after meals.     HYDROcodone-acetaminophen 7.5-325 MG tablet  Commonly known as:  NORCO  Take 1-2 tablets by mouth every 4 (four) hours as needed for moderate pain.     IMITREX 6 MG/0.5ML Soln injection  Generic drug:  SUMAtriptan  Inject 6 mg into the skin every 2 (two) hours as needed for migraine or headache. May repeat in 2 hours if headache persists or recurs.     meclizine 25 MG tablet  Commonly known as:  ANTIVERT  Take 25  mg by mouth 3 (three) times daily as needed for dizziness.     metoCLOPramide 10 MG tablet  Commonly known as:  REGLAN  Take 1 tablet (10 mg total) by mouth every 6 (six) hours as needed for nausea or vomiting.     metoprolol succinate 25 MG 24 hr tablet  Commonly known as:  TOPROL-XL  Take 25 mg by mouth at bedtime.     ondansetron 8 MG tablet  Commonly known as:  ZOFRAN  Take 8 mg by mouth every 8 (eight) hours as needed for nausea or vomiting.     polyethylene glycol packet  Commonly known as:  MIRALAX / GLYCOLAX  Take 17 g by mouth 2 (two) times daily.     prochlorperazine 10 MG tablet  Commonly known as:  COMPAZINE  Take 10 mg by mouth every 6 (six) hours as needed for nausea or vomiting.     promethazine 25 MG suppository  Commonly known as:  PHENERGAN  Place 25 mg rectally every 6 (six) hours as needed for nausea or vomiting.     sertraline 100 MG tablet  Commonly known as:  ZOLOFT  Take 50 mg by mouth at bedtime.         Signed: Anastasio Auerbach. Vaishnav Demartin   PA-C  08/30/2015, 10:23 AM

## 2015-09-01 DIAGNOSIS — E669 Obesity, unspecified: Secondary | ICD-10-CM | POA: Diagnosis not present

## 2015-09-01 DIAGNOSIS — Z6830 Body mass index (BMI) 30.0-30.9, adult: Secondary | ICD-10-CM | POA: Diagnosis not present

## 2015-09-01 DIAGNOSIS — Z96642 Presence of left artificial hip joint: Secondary | ICD-10-CM | POA: Diagnosis not present

## 2015-09-01 DIAGNOSIS — Z471 Aftercare following joint replacement surgery: Secondary | ICD-10-CM | POA: Diagnosis not present

## 2015-09-03 DIAGNOSIS — Z6830 Body mass index (BMI) 30.0-30.9, adult: Secondary | ICD-10-CM | POA: Diagnosis not present

## 2015-09-03 DIAGNOSIS — E669 Obesity, unspecified: Secondary | ICD-10-CM | POA: Diagnosis not present

## 2015-09-03 DIAGNOSIS — Z471 Aftercare following joint replacement surgery: Secondary | ICD-10-CM | POA: Diagnosis not present

## 2015-09-03 DIAGNOSIS — Z96642 Presence of left artificial hip joint: Secondary | ICD-10-CM | POA: Diagnosis not present

## 2015-09-06 DIAGNOSIS — Z96642 Presence of left artificial hip joint: Secondary | ICD-10-CM | POA: Diagnosis not present

## 2015-09-06 DIAGNOSIS — Z6830 Body mass index (BMI) 30.0-30.9, adult: Secondary | ICD-10-CM | POA: Diagnosis not present

## 2015-09-06 DIAGNOSIS — E669 Obesity, unspecified: Secondary | ICD-10-CM | POA: Diagnosis not present

## 2015-09-06 DIAGNOSIS — Z471 Aftercare following joint replacement surgery: Secondary | ICD-10-CM | POA: Diagnosis not present

## 2015-09-08 DIAGNOSIS — Z471 Aftercare following joint replacement surgery: Secondary | ICD-10-CM | POA: Diagnosis not present

## 2015-09-08 DIAGNOSIS — Z6830 Body mass index (BMI) 30.0-30.9, adult: Secondary | ICD-10-CM | POA: Diagnosis not present

## 2015-09-08 DIAGNOSIS — Z96642 Presence of left artificial hip joint: Secondary | ICD-10-CM | POA: Diagnosis not present

## 2015-09-08 DIAGNOSIS — E669 Obesity, unspecified: Secondary | ICD-10-CM | POA: Diagnosis not present

## 2015-09-10 DIAGNOSIS — Z96642 Presence of left artificial hip joint: Secondary | ICD-10-CM | POA: Diagnosis not present

## 2015-09-10 DIAGNOSIS — Z471 Aftercare following joint replacement surgery: Secondary | ICD-10-CM | POA: Diagnosis not present

## 2015-09-10 DIAGNOSIS — Z6830 Body mass index (BMI) 30.0-30.9, adult: Secondary | ICD-10-CM | POA: Diagnosis not present

## 2015-09-10 DIAGNOSIS — E669 Obesity, unspecified: Secondary | ICD-10-CM | POA: Diagnosis not present

## 2015-10-06 DIAGNOSIS — Z471 Aftercare following joint replacement surgery: Secondary | ICD-10-CM | POA: Diagnosis not present

## 2015-10-06 DIAGNOSIS — Z96642 Presence of left artificial hip joint: Secondary | ICD-10-CM | POA: Diagnosis not present

## 2015-10-08 DIAGNOSIS — Z1231 Encounter for screening mammogram for malignant neoplasm of breast: Secondary | ICD-10-CM | POA: Diagnosis not present

## 2015-10-08 DIAGNOSIS — Z803 Family history of malignant neoplasm of breast: Secondary | ICD-10-CM | POA: Diagnosis not present

## 2015-10-12 DIAGNOSIS — N63 Unspecified lump in breast: Secondary | ICD-10-CM | POA: Diagnosis not present

## 2015-10-12 DIAGNOSIS — R921 Mammographic calcification found on diagnostic imaging of breast: Secondary | ICD-10-CM | POA: Diagnosis not present

## 2015-10-18 DIAGNOSIS — N6011 Diffuse cystic mastopathy of right breast: Secondary | ICD-10-CM | POA: Diagnosis not present

## 2015-10-18 DIAGNOSIS — N63 Unspecified lump in breast: Secondary | ICD-10-CM | POA: Diagnosis not present

## 2015-11-17 DIAGNOSIS — Z96642 Presence of left artificial hip joint: Secondary | ICD-10-CM | POA: Diagnosis not present

## 2015-11-17 DIAGNOSIS — Z471 Aftercare following joint replacement surgery: Secondary | ICD-10-CM | POA: Diagnosis not present

## 2015-12-08 DIAGNOSIS — R55 Syncope and collapse: Secondary | ICD-10-CM | POA: Diagnosis not present

## 2015-12-08 DIAGNOSIS — Z6821 Body mass index (BMI) 21.0-21.9, adult: Secondary | ICD-10-CM | POA: Diagnosis not present

## 2016-01-05 DIAGNOSIS — G8929 Other chronic pain: Secondary | ICD-10-CM | POA: Diagnosis not present

## 2016-01-05 DIAGNOSIS — M25552 Pain in left hip: Secondary | ICD-10-CM | POA: Diagnosis not present

## 2016-01-05 DIAGNOSIS — M25551 Pain in right hip: Secondary | ICD-10-CM | POA: Diagnosis not present

## 2016-01-05 DIAGNOSIS — Z79891 Long term (current) use of opiate analgesic: Secondary | ICD-10-CM | POA: Diagnosis not present

## 2016-01-14 DIAGNOSIS — L237 Allergic contact dermatitis due to plants, except food: Secondary | ICD-10-CM | POA: Diagnosis not present

## 2016-01-18 DIAGNOSIS — L237 Allergic contact dermatitis due to plants, except food: Secondary | ICD-10-CM | POA: Diagnosis not present

## 2016-04-11 ENCOUNTER — Emergency Department (HOSPITAL_COMMUNITY): Payer: BLUE CROSS/BLUE SHIELD

## 2016-04-11 ENCOUNTER — Inpatient Hospital Stay (HOSPITAL_COMMUNITY)
Admission: EM | Admit: 2016-04-11 | Discharge: 2016-04-14 | DRG: 494 | Disposition: A | Discharge status: Home Health | Payer: BLUE CROSS/BLUE SHIELD | Attending: Orthopedic Surgery | Admitting: Orthopedic Surgery

## 2016-04-11 ENCOUNTER — Encounter (HOSPITAL_COMMUNITY): Payer: Self-pay | Admitting: Emergency Medicine

## 2016-04-11 DIAGNOSIS — M25572 Pain in left ankle and joints of left foot: Secondary | ICD-10-CM | POA: Diagnosis not present

## 2016-04-11 DIAGNOSIS — Z96642 Presence of left artificial hip joint: Secondary | ICD-10-CM | POA: Diagnosis not present

## 2016-04-11 DIAGNOSIS — Z79891 Long term (current) use of opiate analgesic: Secondary | ICD-10-CM | POA: Diagnosis not present

## 2016-04-11 DIAGNOSIS — S8252XA Displaced fracture of medial malleolus of left tibia, initial encounter for closed fracture: Secondary | ICD-10-CM | POA: Diagnosis not present

## 2016-04-11 DIAGNOSIS — S82242A Displaced spiral fracture of shaft of left tibia, initial encounter for closed fracture: Secondary | ICD-10-CM

## 2016-04-11 DIAGNOSIS — S82302A Unspecified fracture of lower end of left tibia, initial encounter for closed fracture: Secondary | ICD-10-CM | POA: Diagnosis not present

## 2016-04-11 DIAGNOSIS — T148XXA Other injury of unspecified body region, initial encounter: Secondary | ICD-10-CM | POA: Diagnosis not present

## 2016-04-11 DIAGNOSIS — S82292A Other fracture of shaft of left tibia, initial encounter for closed fracture: Secondary | ICD-10-CM | POA: Diagnosis not present

## 2016-04-11 DIAGNOSIS — S82202A Unspecified fracture of shaft of left tibia, initial encounter for closed fracture: Secondary | ICD-10-CM | POA: Diagnosis not present

## 2016-04-11 DIAGNOSIS — Z6834 Body mass index (BMI) 34.0-34.9, adult: Secondary | ICD-10-CM

## 2016-04-11 DIAGNOSIS — Z791 Long term (current) use of non-steroidal anti-inflammatories (NSAID): Secondary | ICD-10-CM | POA: Diagnosis not present

## 2016-04-11 DIAGNOSIS — S8265XA Nondisplaced fracture of lateral malleolus of left fibula, initial encounter for closed fracture: Secondary | ICD-10-CM | POA: Diagnosis not present

## 2016-04-11 DIAGNOSIS — S82842A Displaced bimalleolar fracture of left lower leg, initial encounter for closed fracture: Principal | ICD-10-CM | POA: Diagnosis present

## 2016-04-11 DIAGNOSIS — W010XXA Fall on same level from slipping, tripping and stumbling without subsequent striking against object, initial encounter: Secondary | ICD-10-CM | POA: Diagnosis present

## 2016-04-11 DIAGNOSIS — S82832A Other fracture of upper and lower end of left fibula, initial encounter for closed fracture: Secondary | ICD-10-CM | POA: Diagnosis not present

## 2016-04-11 DIAGNOSIS — S8262XA Displaced fracture of lateral malleolus of left fibula, initial encounter for closed fracture: Secondary | ICD-10-CM | POA: Diagnosis not present

## 2016-04-11 DIAGNOSIS — G8918 Other acute postprocedural pain: Secondary | ICD-10-CM | POA: Diagnosis not present

## 2016-04-11 DIAGNOSIS — Y92007 Garden or yard of unspecified non-institutional (private) residence as the place of occurrence of the external cause: Secondary | ICD-10-CM | POA: Diagnosis not present

## 2016-04-11 DIAGNOSIS — S82402A Unspecified fracture of shaft of left fibula, initial encounter for closed fracture: Secondary | ICD-10-CM

## 2016-04-11 DIAGNOSIS — Z419 Encounter for procedure for purposes other than remedying health state, unspecified: Secondary | ICD-10-CM

## 2016-04-11 DIAGNOSIS — M79662 Pain in left lower leg: Secondary | ICD-10-CM | POA: Diagnosis not present

## 2016-04-11 LAB — CBC WITH DIFFERENTIAL/PLATELET
Basophils Absolute: 0 10*3/uL (ref 0.0–0.1)
Basophils Relative: 0 %
Eosinophils Absolute: 0 10*3/uL (ref 0.0–0.7)
Eosinophils Relative: 0 %
HCT: 31.8 % — ABNORMAL LOW (ref 36.0–46.0)
Hemoglobin: 10.2 g/dL — ABNORMAL LOW (ref 12.0–15.0)
Lymphocytes Relative: 9 %
Lymphs Abs: 1.2 10*3/uL (ref 0.7–4.0)
MCH: 23.9 pg — ABNORMAL LOW (ref 26.0–34.0)
MCHC: 32.1 g/dL (ref 30.0–36.0)
MCV: 74.5 fL — ABNORMAL LOW (ref 78.0–100.0)
Monocytes Absolute: 0.8 10*3/uL (ref 0.1–1.0)
Monocytes Relative: 6 %
Neutro Abs: 10.9 10*3/uL — ABNORMAL HIGH (ref 1.7–7.7)
Neutrophils Relative %: 85 %
Platelets: 400 10*3/uL (ref 150–400)
RBC: 4.27 MIL/uL (ref 3.87–5.11)
RDW: 16 % — ABNORMAL HIGH (ref 11.5–15.5)
WBC: 12.9 10*3/uL — ABNORMAL HIGH (ref 4.0–10.5)

## 2016-04-11 LAB — BASIC METABOLIC PANEL
Anion gap: 6 (ref 5–15)
BUN: 13 mg/dL (ref 6–20)
CO2: 26 mmol/L (ref 22–32)
Calcium: 8.8 mg/dL — ABNORMAL LOW (ref 8.9–10.3)
Chloride: 105 mmol/L (ref 101–111)
Creatinine, Ser: 0.88 mg/dL (ref 0.44–1.00)
GFR calc Af Amer: >60 mL/min (ref >60)
GFR calc non Af Amer: >60 mL/min (ref >60)
Glucose, Bld: 125 mg/dL — ABNORMAL HIGH (ref 65–99)
Potassium: 3.9 mmol/L (ref 3.5–5.1)
Sodium: 137 mmol/L (ref 135–145)

## 2016-04-11 MED ORDER — HYDROMORPHONE HCL 2 MG/ML IJ SOLN
0.5000 mg | INTRAMUSCULAR | Status: AC | PRN
  Administered 2016-04-12 (×4): 0.5 mg via INTRAVENOUS
  Filled 2016-04-11 (×4): qty 1

## 2016-04-11 MED ORDER — ACETAMINOPHEN 500 MG PO TABS
1000.0000 mg | ORAL_TABLET | Freq: Four times a day (QID) | ORAL | Status: DC
  Administered 2016-04-12 – 2016-04-13 (×6): 1000 mg via ORAL
  Filled 2016-04-11 (×7): qty 2

## 2016-04-11 MED ORDER — HYDROMORPHONE HCL 1 MG/ML IJ SOLN
1.0000 mg | Freq: Once | INTRAMUSCULAR | Status: AC
  Administered 2016-04-11: 1 mg via INTRAVENOUS
  Filled 2016-04-11: qty 1

## 2016-04-11 MED ORDER — METHOCARBAMOL 500 MG PO TABS
500.0000 mg | ORAL_TABLET | Freq: Four times a day (QID) | ORAL | Status: DC | PRN
  Administered 2016-04-12 – 2016-04-14 (×7): 500 mg via ORAL
  Filled 2016-04-11 (×6): qty 1

## 2016-04-11 MED ORDER — ONDANSETRON HCL 4 MG PO TABS: 4.0000 mg | ORAL_TABLET | Freq: Four times a day (QID) | ORAL | Status: DC | PRN

## 2016-04-11 MED ORDER — ACETAMINOPHEN 325 MG PO TABS: 650.0000 mg | ORAL_TABLET | Freq: Four times a day (QID) | ORAL | Status: DC | PRN

## 2016-04-11 MED ORDER — METHOCARBAMOL 1000 MG/10ML IJ SOLN: 500.0000 mg | Freq: Four times a day (QID) | INTRAVENOUS | Status: DC | PRN

## 2016-04-11 MED ORDER — OXYCODONE HCL 5 MG PO TABS
5.0000 mg | ORAL_TABLET | ORAL | Status: DC | PRN
  Administered 2016-04-13 (×3): 10 mg via ORAL
  Administered 2016-04-13: 5 mg via ORAL
  Administered 2016-04-14 (×5): 10 mg via ORAL
  Filled 2016-04-11: qty 1
  Filled 2016-04-11 (×8): qty 2

## 2016-04-11 MED ORDER — MORPHINE SULFATE (PF) 2 MG/ML IV SOLN
2.0000 mg | INTRAVENOUS | Status: DC | PRN
  Administered 2016-04-12 – 2016-04-13 (×4): 2 mg via INTRAVENOUS
  Filled 2016-04-11 (×4): qty 1

## 2016-04-11 MED ORDER — DOCUSATE SODIUM 100 MG PO CAPS
100.0000 mg | ORAL_CAPSULE | Freq: Two times a day (BID) | ORAL | Status: DC
  Administered 2016-04-12 – 2016-04-14 (×3): 100 mg via ORAL
  Filled 2016-04-11 (×3): qty 1

## 2016-04-11 MED ORDER — HYDROMORPHONE HCL 1 MG/ML IJ SOLN
0.5000 mg | INTRAMUSCULAR | Status: DC | PRN
  Administered 2016-04-11 (×2): 0.5 mg via INTRAVENOUS
  Filled 2016-04-11 (×2): qty 1

## 2016-04-11 MED ORDER — ONDANSETRON HCL 4 MG/2ML IJ SOLN: 4.0000 mg | Freq: Four times a day (QID) | INTRAMUSCULAR | Status: DC | PRN

## 2016-04-11 MED ORDER — ACETAMINOPHEN 650 MG RE SUPP: 650.0000 mg | Freq: Four times a day (QID) | RECTAL | Status: DC | PRN

## 2016-04-11 NOTE — ED Notes (Signed)
RN will obtained blood work.

## 2016-04-11 NOTE — ED Notes (Signed)
[facility] WILL TRANSPORT PT TO [facility] 5W 17C-1. AAOX4. PT IN NO APPARENT DISTRESS. THE OPPORTUNITY TO ASK QUESTIONS WAS PROVIDED.

## 2016-04-11 NOTE — ED Provider Notes (Signed)
[facility]-EMERGENCY DEPT Provider Note   CSN: [id number] Arrival date & time: 04/11/16  1721     History   Chief Complaint Chief Complaint  Patient presents with  . Fall    HPI Angela Harrison is a 41 y.o. female.  Patient was gathering firewood when she tripped over a tree branch and fell backwards injuring her left ankle. She felt a pop with 10 out of 10 severe sharp shooting pain. Her foot was deformed and was straightened out by EMS. She denies hitting her head or loss of consciousness. She has no pain in her trunk, upper extremities or her right lower extremity. Received fentanyl in the ambulance with significant improvement of her pain and she is also immobilized.   The history is provided by the patient.  Fall  This is a new problem. The current episode started 1 to 2 hours ago. The problem occurs constantly. The problem has not changed since onset.Associated symptoms comments: Severe pain in the left ankle and deformity. The symptoms are aggravated by bending, twisting and walking. Relieved by: immobilization. Treatments tried: fentanyl and immobilization. The treatment provided moderate relief.    Past Medical History:  Diagnosis Date  . Arthritis    oa  . Ehlers-Danlos syndrome type III   . Gastric paresis   . Migraine   . Syncope    neurocardiogenic takes toprol and zoloft for    Patient Active Problem List   Diagnosis Date Noted  . Obese 08/25/2015  . S/P left THA, AA 08/24/2015  . PANIC DISORDER WITHOUT AGORAPHOBIA 09/24/2007  . MIGRAINE HEADACHE 09/24/2007  . 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 DYSPNEA 09/24/2007    Past Surgical History:  Procedure Laterality Date  . CHOLECYSTECTOMY    . ESOPHAGOGASTRODUODENOSCOPY ENDOSCOPY     x 2 or 3  . KNEE SURGERY Bilateral    2 on each knee  . MAXILLARY ANTROSTOMY    . SHOULDER SURGERY Left    x 4  . TONSILLECTOMY     adenoids also  . TOTAL HIP ARTHROPLASTY Left 08/24/2015   Procedure: LEFT TOTAL  HIP ARTHROPLASTY ANTERIOR APPROACH;  Surgeon: Durene RomansMatthew Olin, MD;  Location: [facility] ORS;  Service: Orthopedics;  Laterality: Left;    OB History    No data available       Home Medications    Prior to Admission medications   Medication Sig Start Date End Date Taking? Authorizing Provider  cyclobenzaprine (FLEXERIL) 10 MG tablet Take 1 tablet (10 mg total) by mouth 3 (three) times daily as needed for muscle spasms. 08/25/15   Lanney GinsMatthew Babish, PA-C  docusate sodium (COLACE) 100 MG capsule Take 1 capsule (100 mg total) by mouth 2 (two) times daily. 08/25/15   Lanney GinsMatthew Babish, PA-C  ferrous sulfate 325 (65 FE) MG tablet Take 1 tablet (325 mg total) by mouth 3 (three) times daily after meals. 08/25/15   Lanney GinsMatthew Babish, PA-C  HYDROcodone-acetaminophen (NORCO) 7.5-325 MG tablet Take 1-2 tablets by mouth every 4 (four) hours as needed for moderate pain. 08/25/15   Lanney GinsMatthew Babish, PA-C  meclizine (ANTIVERT) 25 MG tablet Take 25 mg by mouth 3 (three) times daily as needed for dizziness.    Historical Provider, MD  metoCLOPramide (REGLAN) 10 MG tablet Take 1 tablet (10 mg total) by mouth every 6 (six) hours as needed for nausea or vomiting. 04/05/13   Cathren LaineKevin Steinl, MD  metoprolol succinate (TOPROL-XL) 25 MG 24 hr tablet Take 25 mg by mouth at bedtime.  Historical Provider, MD  ondansetron (ZOFRAN) 8 MG tablet Take 8 mg by mouth every 8 (eight) hours as needed for nausea or vomiting.     Historical Provider, MD  polyethylene glycol (MIRALAX / GLYCOLAX) packet Take 17 g by mouth 2 (two) times daily. 08/25/15   Lanney Gins, PA-C  prochlorperazine (COMPAZINE) 10 MG tablet Take 10 mg by mouth every 6 (six) hours as needed for nausea or vomiting.     Historical Provider, MD  promethazine (PHENERGAN) 25 MG suppository Place 25 mg rectally every 6 (six) hours as needed for nausea or vomiting.     Historical Provider, MD  sertraline (ZOLOFT) 100 MG tablet Take 50 mg by mouth at bedtime.    Historical Provider, MD    SUMAtriptan (IMITREX) 6 MG/0.5ML SOLN injection Inject 6 mg into the skin every 2 (two) hours as needed for migraine or headache. May repeat in 2 hours if headache persists or recurs.    Historical Provider, MD    Family History No family history on file.  Social History Social History  Substance Use Topics  . Smoking status: Never Smoker  . Smokeless tobacco: Never Used  . Alcohol use Yes     Comment: Socially      Allergies   Cephalosporins; Cefazolin; and Cephalexin   Review of Systems Review of Systems  All other systems reviewed and are negative.    Physical Exam Updated Vital Signs BP 117/83 (BP Location: Right Arm)   Pulse 75   Temp 97.4 F (36.3 C) (Oral)   Resp 15   SpO2 95%   Physical Exam  Constitutional: She is oriented to person, place, and time. She appears well-developed and well-nourished. No distress.  HENT:  Head: Normocephalic and atraumatic.  Mouth/Throat: Oropharynx is clear and moist.  Eyes: Conjunctivae and EOM are normal. Pupils are equal, round, and reactive to light.  Neck: Normal range of motion. Neck supple. No spinous process tenderness and no muscular tenderness present.  Cardiovascular: Normal rate, regular rhythm and intact distal pulses.   No murmur heard. Pulmonary/Chest: Effort normal and breath sounds normal. No respiratory distress. She has no wheezes. She has no rales.  Abdominal: Soft. She exhibits no distension. There is no tenderness. There is no rebound and no guarding.  Musculoskeletal: She exhibits tenderness and deformity. She exhibits no edema.       Left ankle: She exhibits decreased range of motion, swelling and deformity. Tenderness. Lateral malleolus and medial malleolus tenderness found. No head of 5th metatarsal and no proximal fibula tenderness found. Achilles tendon exhibits no pain.  2+ DP pulse on the left foot and normal sensation with normal movement of the toes  Neurological: She is alert and oriented to  person, place, and time.  Skin: Skin is warm and dry. No rash noted. No erythema.  Psychiatric: She has a normal mood and affect. Her behavior is normal.  Nursing note and vitals reviewed.    ED Treatments / Results  Labs (all labs ordered are listed, but only abnormal results are displayed) Labs Reviewed  CBC WITH DIFFERENTIAL/PLATELET  BASIC METABOLIC PANEL    EKG  EKG Interpretation None       Radiology Dg Tibia/fibula Left  Result Date: 04/11/2016 CLINICAL DATA:  Fall EXAM: LEFT TIBIA AND FIBULA - 2 VIEW COMPARISON:  None. FINDINGS: Spiral fracture distal tibia with displacement. This does not extend into the ankle joint. Fracture of the distal fibula and posterior malleolus. 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 IMPRESSION: Spiral fracture distal tibia. Fracture distal fibula and posterior malleolus. Electronically Signed   By: Marlan Palau M.D.   On: 04/11/2016 18:22   Dg Ankle Complete Left  Result Date: 04/11/2016 CLINICAL DATA:  Fall EXAM: LEFT ANKLE COMPLETE - 3+ VIEW COMPARISON:  None FINDINGS: Spiral fracture distal tibia not extending into the ankle joint. Posterior malleolar fracture. Oblique fracture distal fibula with mild displacement. Ankle mortise intact. IMPRESSION: Spiral fracture distal tibia with displacement. Fracture of the distal fibula and posterior malleolus. Electronically Signed   By: Marlan Palau M.D.   On: 04/11/2016 18:21    Procedures Procedures (including critical care time)  Medications Ordered in ED Medications - No data to display   Initial Impression / Assessment and Plan / ED Course  I have reviewed the triage vital signs and the nursing notes.  Pertinent labs & imaging results that were available during my care of the patient were reviewed by me and considered in my medical decision making (see chart for details).  Clinical Course    Patient with a mechanical fall today with severe pain and deformity  of the left lower leg. No other injury at this time. Imaging is consistent with a spiral fracture of the distal tibia with displacement and a fracture of the distal fibula and posterior malleolus. Will speak with orthopedics for further management recommendation. On reevaluation patient still has normal sensation in her left foot and 2+ DP pulse.  6:55 PM Spoke with Dr. Aundria Rud who [facility] and requesting splinting and ct before admission  Final Clinical Impressions(s) / ED Diagnoses   Final diagnoses:  Displaced spiral fracture of shaft of left tibia, initial encounter for closed fracture  Closed fracture of distal end of left fibula, unspecified fracture morphology, initial encounter    New Prescriptions New Prescriptions   No medications on file     Gwyneth Sprout, MD 04/11/16 1856

## 2016-04-11 NOTE — ED Notes (Signed)
Bed: [id number] Expected date:  Expected time:  Means of arrival:  Comments: EMS 39 F fall deformity to leg

## 2016-04-11 NOTE — Progress Notes (Signed)
I have reviewed images and discussed case with Dr. Anitra LauthPlunkett in [facility] and the patient is currently getting splinted and a CT scan [facility].  She is stable and I will put in admission orders now and see her on rounds in the am in preparation for her surgery tomorrow afternoon.  NPO at [place].  Elevate LLE.

## 2016-04-11 NOTE — ED Triage Notes (Addendum)
Per EMS-was stacking wood and tripped on a root-swelling and increased pain to left lower extremity/ankle-200 mg of IV Fentanyl given in route

## 2016-04-11 NOTE — ED Notes (Signed)
FIRST ATTEMPT TO CALL REPORT TO [facility] 5W 17C-1.

## 2016-04-12 ENCOUNTER — Inpatient Hospital Stay (HOSPITAL_COMMUNITY): Payer: BLUE CROSS/BLUE SHIELD | Admitting: Certified Registered"

## 2016-04-12 ENCOUNTER — Inpatient Hospital Stay (HOSPITAL_COMMUNITY): Payer: BLUE CROSS/BLUE SHIELD

## 2016-04-12 ENCOUNTER — Encounter (HOSPITAL_COMMUNITY)
Admission: EM | Disposition: A | Discharge status: Home Health | Payer: Self-pay | Source: Home / Self Care | Attending: Orthopedic Surgery

## 2016-04-12 HISTORY — PX: ORIF ANKLE FRACTURE: SHX5408

## 2016-04-12 HISTORY — PX: TIBIA IM NAIL INSERTION: SHX2516

## 2016-04-12 LAB — CBC
HCT: 31.5 % — ABNORMAL LOW (ref 36.0–46.0)
Hemoglobin: 9.7 g/dL — ABNORMAL LOW (ref 12.0–15.0)
MCH: 23.7 pg — ABNORMAL LOW (ref 26.0–34.0)
MCHC: 30.8 g/dL (ref 30.0–36.0)
MCV: 76.8 fL — ABNORMAL LOW (ref 78.0–100.0)
Platelets: 266 10*3/uL (ref 150–400)
RBC: 4.1 MIL/uL (ref 3.87–5.11)
RDW: 16.5 % — ABNORMAL HIGH (ref 11.5–15.5)
WBC: 9.7 10*3/uL (ref 4.0–10.5)

## 2016-04-12 LAB — CREATININE, SERUM
Creatinine, Ser: 0.54 mg/dL (ref 0.44–1.00)
GFR calc Af Amer: >60 mL/min (ref >60)
GFR calc non Af Amer: >60 mL/min (ref >60)

## 2016-04-12 LAB — SURGICAL PCR SCREEN
MRSA, PCR: NEGATIVE
Staphylococcus aureus: NEGATIVE

## 2016-04-12 SURGERY — INSERTION, INTRAMEDULLARY ROD, TIBIA
Anesthesia: Regional | Site: Leg Lower | Laterality: Left

## 2016-04-12 MED ORDER — ONDANSETRON HCL 4 MG/2ML IJ SOLN
INTRAMUSCULAR | Status: DC | PRN
  Administered 2016-04-12: 4 mg via INTRAVENOUS

## 2016-04-12 MED ORDER — PHENYLEPHRINE 40 MCG/ML (10ML) SYRINGE FOR IV PUSH (FOR BLOOD PRESSURE SUPPORT)
PREFILLED_SYRINGE | INTRAVENOUS | Status: AC
  Filled 2016-04-12: qty 10

## 2016-04-12 MED ORDER — CLINDAMYCIN PHOSPHATE 600 MG/50ML IV SOLN
600.0000 mg | Freq: Four times a day (QID) | INTRAVENOUS | Status: AC
  Administered 2016-04-12 – 2016-04-13 (×3): 600 mg via INTRAVENOUS
  Filled 2016-04-12 (×3): qty 50

## 2016-04-12 MED ORDER — POLYETHYLENE GLYCOL 3350 17 G PO PACK
17.0000 g | PACK | Freq: Two times a day (BID) | ORAL | Status: DC
  Administered 2016-04-13 – 2016-04-14 (×2): 17 g via ORAL
  Filled 2016-04-12 (×2): qty 1

## 2016-04-12 MED ORDER — ONDANSETRON HCL 4 MG/2ML IJ SOLN
INTRAMUSCULAR | Status: AC
  Filled 2016-04-12: qty 2

## 2016-04-12 MED ORDER — PHENYLEPHRINE HCL 10 MG/ML IJ SOLN
INTRAMUSCULAR | Status: DC | PRN
  Administered 2016-04-12 (×2): 200 ug via INTRAVENOUS

## 2016-04-12 MED ORDER — ROCURONIUM BROMIDE 50 MG/5ML IV SOSY
PREFILLED_SYRINGE | INTRAVENOUS | Status: AC
Start: 2016-04-12 — End: 2016-04-12
  Filled 2016-04-12: qty 5

## 2016-04-12 MED ORDER — METOCLOPRAMIDE HCL 10 MG PO TABS: 5.0000 mg | ORAL_TABLET | Freq: Three times a day (TID) | ORAL | Status: DC | PRN

## 2016-04-12 MED ORDER — CLINDAMYCIN PHOSPHATE 900 MG/50ML IV SOLN
INTRAVENOUS | Status: DC | PRN
  Administered 2016-04-12: 900 mg via INTRAVENOUS

## 2016-04-12 MED ORDER — BUPIVACAINE HCL (PF) 0.75 % IJ SOLN: INTRAMUSCULAR | Status: DC | PRN

## 2016-04-12 MED ORDER — PHENYLEPHRINE HCL 10 MG/ML IJ SOLN
INTRAVENOUS | Status: DC | PRN
  Administered 2016-04-12: 40 ug/min via INTRAVENOUS

## 2016-04-12 MED ORDER — MIDAZOLAM HCL 2 MG/2ML IJ SOLN
INTRAMUSCULAR | Status: AC
  Filled 2016-04-12: qty 2

## 2016-04-12 MED ORDER — ROPIVACAINE HCL 7.5 MG/ML IJ SOLN
INTRAMUSCULAR | Status: DC | PRN
  Administered 2016-04-12 (×2): 10 mL via PERINEURAL

## 2016-04-12 MED ORDER — ONDANSETRON HCL 4 MG PO TABS: 4.0000 mg | ORAL_TABLET | Freq: Four times a day (QID) | ORAL | Status: DC | PRN

## 2016-04-12 MED ORDER — HYDROMORPHONE HCL 1 MG/ML IJ SOLN
0.2500 mg | INTRAMUSCULAR | Status: DC | PRN
  Administered 2016-04-12: 0.5 mg via INTRAVENOUS

## 2016-04-12 MED ORDER — ROCURONIUM BROMIDE 100 MG/10ML IV SOLN
INTRAVENOUS | Status: DC | PRN
  Administered 2016-04-12: 50 mg via INTRAVENOUS

## 2016-04-12 MED ORDER — SERTRALINE HCL 50 MG PO TABS
50.0000 mg | ORAL_TABLET | Freq: Every day | ORAL | Status: DC
  Administered 2016-04-12 – 2016-04-13 (×2): 50 mg via ORAL
  Filled 2016-04-12 (×2): qty 1

## 2016-04-12 MED ORDER — LIDOCAINE 2% (20 MG/ML) 5 ML SYRINGE
INTRAMUSCULAR | Status: AC
  Filled 2016-04-12: qty 5

## 2016-04-12 MED ORDER — CLINDAMYCIN PHOSPHATE 900 MG/50ML IV SOLN
INTRAVENOUS | Status: AC
  Filled 2016-04-12: qty 50

## 2016-04-12 MED ORDER — HYDROCODONE-ACETAMINOPHEN 7.5-325 MG PO TABS
1.0000 | ORAL_TABLET | ORAL | Status: DC | PRN
  Administered 2016-04-12 – 2016-04-13 (×3): 1 via ORAL
  Filled 2016-04-12: qty 2
  Filled 2016-04-12 (×3): qty 1

## 2016-04-12 MED ORDER — 0.9 % SODIUM CHLORIDE (POUR BTL) OPTIME
TOPICAL | Status: DC | PRN
  Administered 2016-04-12: 1000 mL

## 2016-04-12 MED ORDER — BUPIVACAINE-EPINEPHRINE (PF) 0.5% -1:200000 IJ SOLN
INTRAMUSCULAR | Status: DC | PRN
  Administered 2016-04-12: 10 mL via PERINEURAL
  Administered 2016-04-12: 20 mL via PERINEURAL

## 2016-04-12 MED ORDER — METOPROLOL SUCCINATE ER 25 MG PO TB24
25.0000 mg | ORAL_TABLET | Freq: Every day | ORAL | Status: DC
  Administered 2016-04-12 – 2016-04-14 (×3): 25 mg via ORAL
  Filled 2016-04-12 (×3): qty 1

## 2016-04-12 MED ORDER — ACETAMINOPHEN 650 MG RE SUPP: 650.0000 mg | Freq: Four times a day (QID) | RECTAL | Status: DC | PRN

## 2016-04-12 MED ORDER — PROPOFOL 10 MG/ML IV BOLUS
INTRAVENOUS | Status: AC
  Filled 2016-04-12: qty 20

## 2016-04-12 MED ORDER — PROPOFOL 10 MG/ML IV BOLUS
INTRAVENOUS | Status: DC | PRN
  Administered 2016-04-12: 160 mg via INTRAVENOUS

## 2016-04-12 MED ORDER — METOCLOPRAMIDE HCL 5 MG/ML IJ SOLN: 5.0000 mg | Freq: Three times a day (TID) | INTRAMUSCULAR | Status: DC | PRN

## 2016-04-12 MED ORDER — METOCLOPRAMIDE HCL 10 MG PO TABS: 10.0000 mg | ORAL_TABLET | Freq: Four times a day (QID) | ORAL | Status: DC | PRN

## 2016-04-12 MED ORDER — PROCHLORPERAZINE MALEATE 10 MG PO TABS
10.0000 mg | ORAL_TABLET | Freq: Four times a day (QID) | ORAL | Status: DC | PRN
  Filled 2016-04-12: qty 1

## 2016-04-12 MED ORDER — ONDANSETRON HCL 4 MG/2ML IJ SOLN: 4.0000 mg | Freq: Four times a day (QID) | INTRAMUSCULAR | Status: DC | PRN

## 2016-04-12 MED ORDER — OXYCODONE HCL 5 MG/5ML PO SOLN: 5.0000 mg | Freq: Once | ORAL | Status: DC | PRN

## 2016-04-12 MED ORDER — LACTATED RINGERS IV SOLN
INTRAVENOUS | Status: DC
  Administered 2016-04-12 (×3): via INTRAVENOUS

## 2016-04-12 MED ORDER — ACETAMINOPHEN 325 MG PO TABS: 650.0000 mg | ORAL_TABLET | Freq: Four times a day (QID) | ORAL | Status: DC | PRN

## 2016-04-12 MED ORDER — OXYCODONE HCL 5 MG PO TABS: 5.0000 mg | ORAL_TABLET | Freq: Once | ORAL | Status: DC | PRN

## 2016-04-12 MED ORDER — LIDOCAINE HCL (CARDIAC) 20 MG/ML IV SOLN
INTRAVENOUS | Status: DC | PRN
  Administered 2016-04-12: 60 mg via INTRAVENOUS

## 2016-04-12 MED ORDER — FENTANYL CITRATE (PF) 100 MCG/2ML IJ SOLN
INTRAMUSCULAR | Status: AC
  Filled 2016-04-12: qty 4

## 2016-04-12 MED ORDER — FENTANYL CITRATE (PF) 100 MCG/2ML IJ SOLN
INTRAMUSCULAR | Status: DC | PRN
  Administered 2016-04-12 (×3): 50 ug via INTRAVENOUS

## 2016-04-12 MED ORDER — MIDAZOLAM HCL 5 MG/5ML IJ SOLN
INTRAMUSCULAR | Status: DC | PRN
  Administered 2016-04-12: 2 mg via INTRAVENOUS

## 2016-04-12 MED ORDER — HYDROMORPHONE HCL 1 MG/ML IJ SOLN
INTRAMUSCULAR | Status: AC
  Filled 2016-04-12: qty 0.5

## 2016-04-12 MED ORDER — EPHEDRINE 5 MG/ML INJ
INTRAVENOUS | Status: AC
  Filled 2016-04-12: qty 10

## 2016-04-12 MED ORDER — EPHEDRINE SULFATE 50 MG/ML IJ SOLN
INTRAMUSCULAR | Status: DC | PRN
  Administered 2016-04-12: 10 mg via INTRAVENOUS
  Administered 2016-04-12: 15 mg via INTRAVENOUS

## 2016-04-12 MED ORDER — SUGAMMADEX SODIUM 200 MG/2ML IV SOLN
INTRAVENOUS | Status: DC | PRN
  Administered 2016-04-12: 160 mg via INTRAVENOUS

## 2016-04-12 MED ORDER — MECLIZINE HCL 25 MG PO TABS: 25.0000 mg | ORAL_TABLET | Freq: Three times a day (TID) | ORAL | Status: DC | PRN

## 2016-04-12 MED ORDER — FERROUS SULFATE 325 (65 FE) MG PO TABS
325.0000 mg | ORAL_TABLET | Freq: Three times a day (TID) | ORAL | Status: DC
  Administered 2016-04-13 – 2016-04-14 (×5): 325 mg via ORAL
  Filled 2016-04-12 (×5): qty 1

## 2016-04-12 MED ORDER — ENOXAPARIN SODIUM 40 MG/0.4ML ~~LOC~~ SOLN
40.0000 mg | SUBCUTANEOUS | Status: DC
  Administered 2016-04-13 – 2016-04-14 (×2): 40 mg via SUBCUTANEOUS
  Filled 2016-04-12 (×2): qty 0.4

## 2016-04-12 MED ORDER — SUGAMMADEX SODIUM 200 MG/2ML IV SOLN
INTRAVENOUS | Status: AC
  Filled 2016-04-12: qty 2

## 2016-04-12 MED ORDER — CYCLOBENZAPRINE HCL 10 MG PO TABS: 10.0000 mg | ORAL_TABLET | Freq: Three times a day (TID) | ORAL | Status: DC | PRN

## 2016-04-12 SURGICAL SUPPLY — 86 items
BANDAGE ACE 4X5 VEL STRL LF (GAUZE/BANDAGES/DRESSINGS) ×2 IMPLANT
BANDAGE ACE 6X5 VEL STRL LF (GAUZE/BANDAGES/DRESSINGS) ×4 IMPLANT
BIT DRILL AO GAMMA 4.2X130 (BIT) ×2 IMPLANT
BIT DRILL AO GAMMA 4.2X180 (BIT) ×2 IMPLANT
BIT DRILL AO GAMMA 4.2X340 (BIT) ×2 IMPLANT
BLADE SURG 10 STRL SS (BLADE) ×2 IMPLANT
CANISTER SUCT 3000ML PPV (MISCELLANEOUS) ×2 IMPLANT
COVER MAYO STAND STRL (DRAPES) ×2 IMPLANT
COVER SURGICAL LIGHT HANDLE (MISCELLANEOUS) ×2 IMPLANT
CUFF TOURNIQUET SINGLE 34IN LL (TOURNIQUET CUFF) IMPLANT
DRAPE C-ARM 42X72 X-RAY (DRAPES) ×2 IMPLANT
DRAPE C-ARMOR (DRAPES) ×2 IMPLANT
DRAPE HALF SHEET 40X57 (DRAPES) ×2 IMPLANT
DRAPE IMP U-DRAPE 54X76 (DRAPES) ×2 IMPLANT
DRAPE U-SHAPE 47X51 STRL (DRAPES) ×2 IMPLANT
DRILL 2.6X122MM WL AO SHAFT (BIT) ×2 IMPLANT
DRSG ADAPTIC 3X8 NADH LF (GAUZE/BANDAGES/DRESSINGS) ×2 IMPLANT
DRSG PAD ABDOMINAL 8X10 ST (GAUZE/BANDAGES/DRESSINGS) ×2 IMPLANT
DURAPREP 26ML APPLICATOR (WOUND CARE) ×2 IMPLANT
ELECT CAUTERY BLADE 6.4 (BLADE) ×2 IMPLANT
ELECT REM PT RETURN 9FT ADLT (ELECTROSURGICAL) ×2
ELECTRODE REM PT RTRN 9FT ADLT (ELECTROSURGICAL) ×1 IMPLANT
GAUZE SPONGE 4X4 12PLY STRL (GAUZE/BANDAGES/DRESSINGS) ×2 IMPLANT
GLOVE BIO SURGEON STRL SZ 6.5 (GLOVE) ×2 IMPLANT
GLOVE BIO SURGEON STRL SZ7.5 (GLOVE) ×4 IMPLANT
GLOVE BIO SURGEON STRL SZ8 (GLOVE) ×2 IMPLANT
GLOVE BIOGEL PI IND STRL 6.5 (GLOVE) ×1 IMPLANT
GLOVE BIOGEL PI IND STRL 7.5 (GLOVE) ×1 IMPLANT
GLOVE BIOGEL PI IND STRL 8 (GLOVE) ×1 IMPLANT
GLOVE BIOGEL PI IND STRL 8.5 (GLOVE) ×1 IMPLANT
GLOVE BIOGEL PI INDICATOR 6.5 (GLOVE) ×1
GLOVE BIOGEL PI INDICATOR 7.5 (GLOVE) ×1
GLOVE BIOGEL PI INDICATOR 8 (GLOVE) ×1
GLOVE BIOGEL PI INDICATOR 8.5 (GLOVE) ×1
GOWN STRL REUS W/ TWL LRG LVL3 (GOWN DISPOSABLE) ×2 IMPLANT
GOWN STRL REUS W/ TWL XL LVL3 (GOWN DISPOSABLE) ×1 IMPLANT
GOWN STRL REUS W/TWL LRG LVL3 (GOWN DISPOSABLE) ×2
GOWN STRL REUS W/TWL XL LVL3 (GOWN DISPOSABLE) ×1
GUIDEROD T2 3X1000 (ROD) ×2 IMPLANT
GUIDEWIRE GAMMA (WIRE) ×4 IMPLANT
K-WIRE FIXATION 3X285 COATED (WIRE) ×4
K-WIRE ORTHOPEDIC 1.4X150L (WIRE) ×4
KIT BASIN OR (CUSTOM PROCEDURE TRAY) ×2 IMPLANT
KIT ROOM TURNOVER OR (KITS) ×2 IMPLANT
KWIRE FIXATION 3X285 COATED (WIRE) ×2 IMPLANT
KWIRE ORTHOPEDIC 1.4X150L (WIRE) ×2 IMPLANT
MANIFOLD NEPTUNE II (INSTRUMENTS) ×2 IMPLANT
NAIL TIBIAL STND 9X375MM (Nail) ×2 IMPLANT
NAIL TIBIALSTD 9X375MM (Nail) ×1 IMPLANT
NS IRRIG 1000ML POUR BTL (IV SOLUTION) ×2 IMPLANT
PACK ORTHO EXTREMITY (CUSTOM PROCEDURE TRAY) ×2 IMPLANT
PAD ARMBOARD 7.5X6 YLW CONV (MISCELLANEOUS) ×4 IMPLANT
PADDING CAST COTTON 6X4 STRL (CAST SUPPLIES) ×2 IMPLANT
PLATE FIBULA 4H (Plate) ×2 IMPLANT
REAMER INTRAMEDULLARY 8MM 510 (MISCELLANEOUS) ×2 IMPLANT
SCREW BONE 14MMX3.5MM (Screw) ×4 IMPLANT
SCREW BONE 18 (Screw) ×2 IMPLANT
SCREW BONE 3.5X16MM (Screw) ×2 IMPLANT
SCREW BONE 3.5X20MM (Screw) ×2 IMPLANT
SCREW BONE NON-LCKING 3.5X12MM (Screw) ×2 IMPLANT
SCREW LOCK 3.5X14 (Screw) ×2 IMPLANT
SCREW LOCKING 3.5X16MM (Screw) ×2 IMPLANT
SCREW LOCKING T2 F/T  5MMX50MM (Screw) ×2 IMPLANT
SCREW LOCKING T2 F/T  5MMX65MM (Screw) ×1 IMPLANT
SCREW LOCKING T2 F/T  5X37.5MM (Screw) ×1 IMPLANT
SCREW LOCKING T2 F/T 5MMX50MM (Screw) ×2 IMPLANT
SCREW LOCKING T2 F/T 5MMX65MM (Screw) ×1 IMPLANT
SCREW LOCKING T2 F/T 5X37.5MM (Screw) ×1 IMPLANT
SHAFT REAMER  8X885MM (MISCELLANEOUS) ×1
SHAFT REAMER 8X885MM (MISCELLANEOUS) ×1 IMPLANT
STAPLER SKIN PROX WIDE 3.9 (STAPLE) ×2 IMPLANT
STAPLER VISISTAT 35W (STAPLE) ×2 IMPLANT
SUCTION FRAZIER HANDLE 10FR (MISCELLANEOUS) ×1
SUCTION TUBE FRAZIER 10FR DISP (MISCELLANEOUS) ×1 IMPLANT
SUT ETHILON 3 0 FSL (SUTURE) ×4 IMPLANT
SUT MON AB 2-0 CT1 36 (SUTURE) ×2 IMPLANT
SUT VIC AB 1 CT1 27 (SUTURE) ×1
SUT VIC AB 1 CT1 27XBRD ANBCTR (SUTURE) ×1 IMPLANT
SUT VIC AB 2-0 CT1 27 (SUTURE) ×1
SUT VIC AB 2-0 CT1 TAPERPNT 27 (SUTURE) ×1 IMPLANT
SUT VIC AB 2-0 CTB1 (SUTURE) ×2 IMPLANT
T2 TIBIA SYSTEM NAIL INSERTION SLEEVE ×2 IMPLANT
TOWEL OR 17X24 6PK STRL BLUE (TOWEL DISPOSABLE) ×2 IMPLANT
TOWEL OR 17X26 10 PK STRL BLUE (TOWEL DISPOSABLE) ×2 IMPLANT
TUBE CONNECTING 12X1/4 (SUCTIONS) ×2 IMPLANT
YANKAUER SUCT BULB TIP NO VENT (SUCTIONS) ×2 IMPLANT

## 2016-04-12 NOTE — H&P (Signed)
ORTHOPAEDIC H and P  REQUESTING PHYSICIAN: Angela Kida, MD  PCP:  Farris Has, MD  Chief Complaint: left ankle fracture  HPI: Angela Harrison is a 41 y.o. female who complains of  Left lower leg pain following a fall at her home yesterday.  She sustained a twisting injury to the left leg following a fall over a root, and had immediate deformity of the left ankle/foot.  She was evaluated in the ED at [facility] and found to have a distal tibia fracture with associated bimalleolar ankle fracture.  Currently she endorses pain but denies any associated symptoms.  She has a hx of Ehlers-Danlos syndrome.  Past Medical History:  Diagnosis Date  . Arthritis    oa  . Ehlers-Danlos syndrome type III   . Gastric paresis   . Migraine   . Syncope    neurocardiogenic takes toprol and zoloft for   Past Surgical History:  Procedure Laterality Date  . CHOLECYSTECTOMY    . ESOPHAGOGASTRODUODENOSCOPY ENDOSCOPY     x 2 or 3  . KNEE SURGERY Bilateral    2 on each knee  . MAXILLARY ANTROSTOMY    . SHOULDER SURGERY Left    x 4  . TONSILLECTOMY     adenoids also  . TOTAL HIP ARTHROPLASTY Left 08/24/2015   Procedure: LEFT TOTAL HIP ARTHROPLASTY ANTERIOR APPROACH;  Surgeon: Durene Romans, MD;  Location: [facility];  Service: Orthopedics;  Laterality: Left;   Social History   Social History  . Marital status: Single    Spouse name: N/A  . Number of children: N/A  . Years of education: N/A   Social History Main Topics  . Smoking status: Never Smoker  . Smokeless tobacco: Never Used  . Alcohol use Yes     Comment: Socially   . Drug use: No  . Sexual activity: Not Asked   Other Topics Concern  . None   Social History Narrative  . None   No family history on file. Allergies  Allergen Reactions  . Cephalosporins Hives, Swelling, Rash and Other (See Comments)    Can tolerate Penicillin  . Cefazolin Hives, Swelling, Rash and Other (See Comments)    Can tolerate  Penicillin   . Cephalexin Hives, Swelling, Rash and Other (See Comments)    Can tolerate Penicillin    Prior to Admission medications   Medication Sig Start Date End Date Taking? Authorizing Provider  HYDROcodone-acetaminophen (NORCO/VICODIN) 5-325 MG tablet Take 1 tablet by mouth every 6 (six) hours as needed. for pain 03/13/16  Yes Historical Provider, MD  ibuprofen (ADVIL,MOTRIN) 200 MG tablet Take 600 mg by mouth every 6 (six) hours as needed for moderate pain.   Yes Historical Provider, MD  meclizine (ANTIVERT) 25 MG tablet Take 25 mg by mouth 3 (three) times daily as needed for dizziness.   Yes Historical Provider, MD  methocarbamol (ROBAXIN) 500 MG tablet Take 500 mg by mouth every 6 (six) hours as needed for muscle spasms.  03/13/16  Yes Historical Provider, MD  metoprolol succinate (TOPROL-XL) 50 MG 24 hr tablet TAKE 1 TABLET (50 MG TOTAL) BY MOUTH EVERY EVENING. 03/13/16  Yes Historical Provider, MD  sertraline (ZOLOFT) 100 MG tablet Take 50 mg by mouth at bedtime.   Yes Historical Provider, MD  sodium chloride 1 g tablet Take 1 g by mouth daily.    Yes Historical Provider, MD  SUMAtriptan (IMITREX) 6 MG/0.5ML SOLN injection Inject 6 mg into the skin every 2 (two) hours  as needed for migraine or headache. May repeat in 2 hours if headache persists or recurs.   Yes Historical Provider, MD  cyclobenzaprine (FLEXERIL) 10 MG tablet Take 1 tablet (10 mg total) by mouth 3 (three) times daily as needed for muscle spasms. Patient not taking: Reported on 04/11/2016 08/25/15   Lanney GinsMatthew Babish, PA-C  docusate sodium (COLACE) 100 MG capsule Take 1 capsule (100 mg total) by mouth 2 (two) times daily. Patient not taking: Reported on 04/11/2016 08/25/15   Lanney GinsMatthew Babish, PA-C  ferrous sulfate 325 (65 FE) MG tablet Take 1 tablet (325 mg total) by mouth 3 (three) times daily after meals. Patient not taking: Reported on 04/11/2016 08/25/15   Lanney GinsMatthew Babish, PA-C  HYDROcodone-acetaminophen (NORCO) 7.5-325 MG  tablet Take 1-2 tablets by mouth every 4 (four) hours as needed for moderate pain. Patient not taking: Reported on 04/11/2016 08/25/15   Lanney GinsMatthew Babish, PA-C  metoCLOPramide (REGLAN) 10 MG tablet Take 1 tablet (10 mg total) by mouth every 6 (six) hours as needed for nausea or vomiting. Patient not taking: Reported on 04/11/2016 04/05/13   Cathren LaineKevin Steinl, MD  ondansetron (ZOFRAN) 8 MG tablet Take 8 mg by mouth every 8 (eight) hours as needed for nausea or vomiting.     Historical Provider, MD  polyethylene glycol (MIRALAX / GLYCOLAX) packet Take 17 g by mouth 2 (two) times daily. Patient not taking: Reported on 04/11/2016 08/25/15   Lanney GinsMatthew Babish, PA-C  prochlorperazine (COMPAZINE) 10 MG tablet Take 10 mg by mouth every 6 (six) hours as needed for nausea or vomiting.     Historical Provider, MD  promethazine (PHENERGAN) 25 MG suppository Place 25 mg rectally every 6 (six) hours as needed for nausea or vomiting.     Historical Provider, MD   Dg Tibia/fibula Left  Result Date: 04/11/2016 CLINICAL DATA:  Fall EXAM: LEFT TIBIA AND FIBULA - 2 VIEW COMPARISON:  None. FINDINGS: Spiral fracture distal tibia with displacement. This does not extend into the ankle joint. Fracture of the distal fibula and posterior malleolus. Chondrocalcinosis in the knee with mild joint space narrowing. IMPRESSION: Spiral fracture distal tibia. Fracture distal fibula and posterior malleolus. Electronically Signed   By: Marlan Palauharles  Clark M.D.   On: 04/11/2016 18:22   Dg Ankle Complete Left  Result Date: 04/11/2016 CLINICAL DATA:  Fall EXAM: LEFT ANKLE COMPLETE - 3+ VIEW COMPARISON:  None FINDINGS: Spiral fracture distal tibia not extending into the ankle joint. Posterior malleolar fracture. Oblique fracture distal fibula with mild displacement. Ankle mortise intact. IMPRESSION: Spiral fracture distal tibia with displacement. Fracture of the distal fibula and posterior malleolus. Electronically Signed   By: Marlan Palauharles  Clark M.D.   On:  04/11/2016 18:21   Ct Ankle Left Wo Contrast  Result Date: 04/11/2016 CLINICAL DATA:  Tripped over tree branch and fracturing left leg. EXAM: CT OF THE LEFT ANKLE WITHOUT CONTRAST TECHNIQUE: Multidetector CT imaging of the left ankle was performed according to the standard protocol. Multiplanar CT image reconstructions were also generated. COMPARISON:  Same day radiographs of the left tibia and fibula. FINDINGS: Bones/Joint/Cartilage Nondisplaced posterior malleolar fracture of the distal tibia. Laterally displaced spiral fracture of the distal tibial diaphysis acute closed lateral fracture of the distal tibial diaphysis with 1 shaft with lateral displacement of the main fracture fragment along its proximal aspect and approximately 1/4 shaft with distally. Oblique fracture of the distal fibular diaphysis with dorsal and medial displacement of the fracture fragment by 1/4 shaft with. The ankle mortise is maintained. Small osteochondral  defect of the lateral talar dome. No fracture of the visualized calcaneus. Subtalar joint is maintained. Ligaments Suboptimally assessed by CT. Muscles and Tendons Diffuse soft tissue edema.  No intramuscular hemorrhage. Soft tissues Diffuse soft tissue swelling about the malleoli. IMPRESSION: 1. Acute closed fractures of the distal diaphysis of the tibia and fibula with 1 shaft with lateral displacement of the main tibial fracture fragment proximally and 1/4 shaft with distally. 2. Oblique fracture of the distal fibular diaphysis with dorsal medial displacement. 3. Nondisplaced posterior malleolar fracture. 4. Small osteochondral defect of the lateral talar dome. 5. The ankle mortise is congruent. Electronically Signed   By: Tollie Eth M.D.   On: 04/11/2016 21:07    Positive ROS: All other systems have been reviewed and were otherwise negative with the exception of those mentioned in the HPI and as above.  Physical Exam: General: Alert, no acute distress Cardiovascular: No  pedal edema Respiratory: No cyanosis, no use of accessory musculature GI: No organomegaly, abdomen is soft and non-tender Skin: No lesions in the area of chief complaint Neurologic: Sensation intact distally Psychiatric: Patient is competent for consent with normal mood and affect Lymphatic: No axillary or cervical lymphadenopathy  MUSCULOSKELETAL:  LLE- short leg splint in place.  Distally, SILT sp/dp, no pain with passive stretch, she is able to fire FHL/EHL, skin wwp.  Assessment: 1. Distal tibia fracture, left, closed 2. Closed bimalleolar ankle fracture, lefgt  Plan: -OR today for operative management of left tibia and left ankle -NPO -elevate -will remain NWB LLE x 6 weeks -The risks, benefits, and alternatives were discussed with the patient. There are risks associated with the surgery including, but not limited to, problems with anesthesia (death), infection, differences in leg length/angulation/rotation, fracture of bones, loosening or failure of implants, malunion, nonunion, hematoma (blood accumulation) which may require surgical drainage, blood clots, pulmonary embolism, nerve injury (foot drop), and blood vessel injury. The patient understands these risks and elects to proceed.     Angela Kida, MD Cell [phone]    04/12/2016 10:33 AM

## 2016-04-12 NOTE — Care Management Note (Addendum)
Case Management Note  Patient Details  Name: Angela Harrison MRN: [id number] Date of Birth: 11/14/1975  Subjective/Objective:                 [facility]. Patient from home with husband.    Action/Plan:  Anticipate DC to home with husband following sx, PT eval and effective pain control after surgery.  Addendum: 04/13/16 Patient states she has RW toilet seat rider and shower seat from friend. Denies need for additional DME. Patient states she would like to use [facility], [facility]  Expected Discharge Date:                  Expected Discharge Plan:     In-House Referral:     Discharge planning Services  CM Consult  Post Acute Care Choice:    Choice offered to:     DME Arranged:    DME Agency:     HH Arranged:    HH Agency:     Status of Service:  In process, will continue to follow  If discussed at Long Length of Stay Meetings, dates discussed:    Additional Comments:  Lawerance SabalDebbie Jourdin Gens, RN 04/12/2016, 10:00 AM

## 2016-04-12 NOTE — Brief Op Note (Signed)
04/11/2016 - 04/12/2016  2:20 PM  PATIENT:  Angela Harrison  41 y.o. female  PRE-OPERATIVE DIAGNOSIS:  Left tib/fib fracture  POST-OPERATIVE DIAGNOSIS:  Left tib/fib fracture  PROCEDURE:  Procedure(s): INTRAMEDULLARY (IM) NAIL TIBIAL LEFT (Left) OPEN REDUCTION INTERNAL FIXATION (ORIF) ANKLE LEFT (Left)  SURGEON:  Surgeon(s) and Role:    * Yolonda KidaJason Patrick Rogers, MD - Primary  PHYSICIAN ASSISTANT:   ASSISTANTS: April Green, RNFA   ANESTHESIA:   general and popliteal block  EBL:  Total I/O In: 1700 [I.V.:1700] Out: 500 [Urine:500]  BLOOD ADMINISTERED:none  DRAINS: none   LOCAL MEDICATIONS USED:  NONE  SPECIMEN:  No Specimen  DISPOSITION OF SPECIMEN:  N/A  COUNTS:  YES  TOURNIQUET:   Total Tourniquet Time Documented: Thigh (Left) - 50 minutes Total: Thigh (Left) - 50 minutes   DICTATION: .Note written in EPIC  PLAN OF CARE: Admit to inpatient   PATIENT DISPOSITION:  PACU - hemodynamically stable.   Delay start of Pharmacological VTE agent (>24hrs) due to surgical blood loss or risk of bleeding: not applicable

## 2016-04-12 NOTE — Anesthesia Procedure Notes (Signed)
Procedure Name: Intubation Date/Time: 04/12/2016 12:15 PM Performed by: Rosiland OzMEYERS, Hilary Pundt Pre-anesthesia Checklist: Patient identified, Emergency Drugs available, Patient being monitored, Suction available and Timeout performed Patient Re-evaluated:Patient Re-evaluated prior to inductionOxygen Delivery Method: Circle system utilized Preoxygenation: Pre-oxygenation with 100% oxygen Intubation Type: IV induction Ventilation: Mask ventilation without difficulty Laryngoscope Size: Miller and 3 Grade View: Grade II Tube type: Oral Tube size: 7.5 mm Number of attempts: 1 Airway Equipment and Method: Stylet Placement Confirmation: ETT inserted through vocal cords under direct vision,  positive ETCO2 and breath sounds checked- equal and bilateral Secured at: 22 cm Tube secured with: Tape Dental Injury: Teeth and Oropharynx as per pre-operative assessment

## 2016-04-12 NOTE — Transfer of Care (Signed)
Immediate Anesthesia Transfer of Care Note  Patient: Angela Harrison  Procedure(s) Performed: Procedure(s): INTRAMEDULLARY (IM) NAIL TIBIAL LEFT (Left) OPEN REDUCTION INTERNAL FIXATION (ORIF) ANKLE LEFT (Left)  Patient Location: PACU  Anesthesia Type:General  Level of Consciousness: lethargic and responds to stimulation  Airway & Oxygen Therapy: Patient Spontanous Breathing and Patient connected to nasal cannula oxygen  Post-op Assessment: Report given to RN  Post vital signs: Reviewed and stable  Last Vitals:  Vitals:   04/11/16 2357 04/12/16 0557  BP: 108/72 96/67  Pulse: 73 62  Resp: 19 16  Temp: 36.5 C 36.5 C    Last Pain:  Vitals:   04/12/16 0859  TempSrc:   PainSc: 5       Patients Stated Pain Goal: 1 (04/12/16 [id number])  Complications: No apparent anesthesia complications

## 2016-04-12 NOTE — Progress Notes (Signed)
Orthopedic Tech Progress Note Patient Details:  Angela BrilliantJennifer F Harrison 05/29/1975 [id number]  Ortho Devices Type of Ortho Device: CAM walker Splint Material: Plaster Ortho Device/Splint Location: lle Ortho Device/Splint Interventions: Freeman CaldronOrdered   Angela Harrison 04/12/2016, 2:38 PM As ordered by Dr. Aundria Rudogers

## 2016-04-12 NOTE — Progress Notes (Signed)
Pt arrived to 5w17 at 2355. On assessment at 0010, pt Alert and Oriented x 4, VS WDL, no acute distress, pt c/o pain, medicated.  Pt husband at the bedside, pt advised about valuable policy, instructed o use call light for assistance. Call light within reach. Admitting doctor paged.

## 2016-04-12 NOTE — Anesthesia Procedure Notes (Signed)
Anesthesia Regional Block:  Adductor canal block  Pre-Anesthetic Checklist: ,, timeout performed, Correct Patient, Correct Site, Correct Laterality, Correct Procedure, Correct Position, site marked, Risks and benefits discussed,  Surgical consent,  Pre-op evaluation,  At surgeon's request and post-op pain management  Laterality: Lower and Left  Prep: chloraprep       Needles:  Injection technique: Single-shot  Needle Type: Echogenic Stimulator Needle          Additional Needles:  Procedures: ultrasound guided (picture in chart) Adductor canal block Narrative:  Start time: 04/12/2016 11:50 AM End time: 04/12/2016 12:02 PM Injection made incrementally with aspirations every 5 mL.  Performed by: Personally  Anesthesiologist: Katie Moch  Additional Notes: H+P and labs reviewed, risks and benefits discussed with patient, procedure tolerated well without complications

## 2016-04-12 NOTE — Anesthesia Procedure Notes (Signed)
Anesthesia Regional Block:  Popliteal block  Pre-Anesthetic Checklist: ,, timeout performed, Correct Patient, Correct Site, Correct Laterality, Correct Procedure, Correct Position, site marked, Risks and benefits discussed,  Surgical consent,  Pre-op evaluation,  At surgeon's request and post-op pain management  Laterality: Lower and Left  Prep: chloraprep       Needles:  Injection technique: Single-shot  Needle Type: Echogenic Stimulator Needle          Additional Needles:  Procedures: ultrasound guided (picture in chart) Popliteal block Narrative:  Start time: 04/12/2016 11:50 AM End time: 04/12/2016 12:02 PM Injection made incrementally with aspirations every 5 mL.  Performed by: Personally  Anesthesiologist: Reshonda Koerber  Additional Notes: H+P and labs reviewed, risks and benefits discussed with patient, procedure tolerated well without complications

## 2016-04-12 NOTE — Anesthesia Preprocedure Evaluation (Signed)
Anesthesia Evaluation   Patient awake    Reviewed: Allergy & Precautions, NPO status , Patient's Chart, lab work & pertinent test results  History of Anesthesia Complications Negative for: history of anesthetic complications  Airway Mallampati: II  TM Distance: >3 FB Neck ROM: Full    Dental  (+) Teeth Intact   Pulmonary shortness of breath,    breath sounds clear to auscultation       Cardiovascular negative cardio ROS   Rhythm:Regular     Neuro/Psych  Headaches, PSYCHIATRIC DISORDERS    GI/Hepatic negative GI ROS, Neg liver ROS,   Endo/Other  Morbid obesity  Renal/GU negative Renal ROS     Musculoskeletal  (+) Arthritis , Left ankle and tibial fracture   Abdominal   Peds  Hematology negative hematology ROS (+)   Anesthesia Other Findings   Reproductive/Obstetrics                             Anesthesia Physical Anesthesia Plan  ASA: II  Anesthesia Plan: General   Post-op Pain Management:  Regional for Post-op pain   Induction: Intravenous  Airway Management Planned: LMA and Oral ETT  Additional Equipment: None  Intra-op Plan:   Post-operative Plan: Extubation in OR  Informed Consent: I have reviewed the patients History and Physical, chart, labs and discussed the procedure including the risks, benefits and alternatives for the proposed anesthesia with the patient or authorized representative who has indicated his/her understanding and acceptance.   Dental advisory given  Plan Discussed with: CRNA and Surgeon  Anesthesia Plan Comments:         Anesthesia Quick Evaluation

## 2016-04-13 NOTE — Evaluation (Signed)
Physical Therapy Evaluation Patient Details Name: Angela Harrison MRN: [id number] DOB: [birth year] Today's Date: 04/13/2016   History of Present Illness  Pt is a 41 y.o. female s/p Left tibial IM nail, Left ankle ORIF. PMHx: Arthritis, Ehlers-Danlos syndrome type III, Gastic paresis, Migrane, Syncope, L THA, Bil knee sx, L shoulder sx x4.  Clinical Impression  Pt able to ambulate 15 ft X2 with rw and min guard assist during initial PT session. Pt having difficulty advancing Rt LE, utilizing sliding pattern but maintaining NWB consistently with reminder cues provided. Anticipate the pt will D/C to home with family support following acute stay. PT to continue to follow and advance as tolerated.     Follow Up Recommendations Home health PT;Supervision for mobility/OOB    Equipment Recommendations  None recommended by PT    Recommendations for Other Services       Precautions / Restrictions Precautions Precautions: Fall Required Braces or Orthoses: Other Brace/Splint Other Brace/Splint: CAM boot present in room, no orders regarding Restrictions Weight Bearing Restrictions: Yes LLE Weight Bearing: Non weight bearing      Mobility  Bed Mobility Overal bed mobility: Needs Assistance Bed Mobility: Supine to Sit     Supine to sit: Supervision;HOB elevated     General bed mobility comments: sitting in chair upon arrival  Transfers Overall transfer level: Needs assistance Equipment used: Rolling walker (2 wheeled) Transfers: Sit to/from Stand Sit to Stand: Min guard Stand pivot transfers: Min assist       General transfer comment: good hand placement, consistent with NWB  Ambulation/Gait Ambulation/Gait assistance: Min guard Ambulation Distance (Feet): 15 Feet (X2) Assistive device: Rolling walker (2 wheeled) Gait Pattern/deviations:  (swing-to pattern) Gait velocity: decreased   General Gait Details: pt primarily sliding Rt LE forward during gait, maintaining NWB with  exception of X2 reminders  Stairs            Wheelchair Mobility    Modified Rankin (Stroke Patients Only)       Balance Overall balance assessment: Needs assistance Sitting-balance support: Feet supported;No upper extremity supported Sitting balance-Leahy Scale: Normal     Standing balance support: Bilateral upper extremity supported Standing balance-Leahy Scale: Poor Standing balance comment: using rw                             Pertinent Vitals/Pain Pain Assessment: 0-10 Pain Score: 5  Pain Location: Lt knee  Pain Descriptors / Indicators: Aching Pain Intervention(s): Limited activity within patient's tolerance;Monitored during session;Ice applied    Home Living Family/patient expects to be discharged to:: Private residence Living Arrangements: Spouse/significant other Available Help at Discharge: Family;Available 24 hours/day;Friend(s) Type of Home: House Home Access: Stairs to enter   [employer] of Steps: 4 from front, 1 from back Home Layout: One level Home Equipment: Walker - 2 wheels;Bedside commode Additional Comments: Has access to above equipment; can borrow from a friend.    Prior Function Level of Independence: Independent               Hand Dominance        Extremity/Trunk Assessment   Upper Extremity Assessment Upper Extremity Assessment: Overall WFL for tasks assessed    Lower Extremity Assessment Lower Extremity Assessment: LLE deficits/detail LLE Deficits / Details: raising LLE with UE assist    Cervical / Trunk Assessment Cervical / Trunk Assessment: Normal  Communication   Communication: No difficulties  Cognition Arousal/Alertness: Awake/alert Behavior During Therapy: WFL for  tasks assessed/performed Overall Cognitive Status: Within Functional Limits for tasks assessed                      General Comments      Exercises     Assessment/Plan    PT Assessment Patient needs  continued PT services  PT Problem List Decreased strength;Decreased range of motion;Decreased activity tolerance;Decreased balance;Decreased mobility          PT Treatment Interventions DME instruction;Gait training;Stair training;Functional mobility training;Therapeutic activities;Therapeutic exercise;Patient/family education    PT Goals (Current goals can be found in the Care Plan section)  Acute Rehab PT Goals Patient Stated Goal: return home PT Goal Formulation: With patient Time For Goal Achievement: 04/27/16 Potential to Achieve Goals: Good    Frequency Min 5X/week   Barriers to discharge        Co-evaluation               End of Session Equipment Utilized During Treatment: Gait belt Activity Tolerance: Patient tolerated treatment well Patient left: in chair;with call bell/phone within reach;with family/visitor present;with nursing/sitter in room (LLE elevated) Nurse Communication: Mobility status         Time: [id number] PT Time Calculation (min) (ACUTE ONLY): 34 min   Charges:   PT Evaluation $PT Eval Moderate Complexity: 1 Procedure PT Treatments $Gait Training: 8-22 mins   PT G Codes:        Christiane HaBenjamin J. Cristian Davitt, PT, CSCS Pager [phone] Office [phone]  04/13/2016, 10:41 AM

## 2016-04-13 NOTE — Progress Notes (Signed)
   Subjective:  Patient reports pain as mild.  Block is starting to wear off, but comfortable.  Objective:   VITALS:   Vitals:   04/13/16 0456 04/13/16 1006 04/13/16 1643 04/13/16 2039  BP: (!) 93/53 112/65 109/64 (!) 101/52  Pulse: 80 86 90 (!) 101  Resp: 16  15 18   Temp: 97.7 F (36.5 C)  98.5 F (36.9 C) 98.6 F (37 C)  TempSrc: Oral  Oral Oral  SpO2: 100%  100% 97%  Weight:      Height:        Neurologically intact Neurovascular intact Intact pulses distally Dorsiflexion/Plantar flexion intact Compartment soft no compartment syndrome   Lab Results  Component Value Date   WBC 9.7 04/12/2016   HGB 9.7 (L) 04/12/2016   HCT 31.5 (L) 04/12/2016   MCV 76.8 (L) 04/12/2016   PLT 266 04/12/2016   BMET    Component Value Date/Time   NA 137 04/11/2016 2009   K 3.9 04/11/2016 2009   CL 105 04/11/2016 2009   CO2 26 04/11/2016 2009   GLUCOSE 125 (H) 04/11/2016 2009   BUN 13 04/11/2016 2009   CREATININE 0.54 04/12/2016 1956   CALCIUM 8.8 (L) 04/11/2016 2009   GFRNONAA >60 04/12/2016 1956   GFRAA >60 04/12/2016 1956     Assessment/Plan: 1 Day Post-Op   Active Problems:   Closed left tibial fracture   Fracture tibia/fibula, left, closed, initial encounter   Up with therapy Continue to monitor pain NWB LLE Fracture boot at all times lovenox in house for DVT ppx, will dc home on asa bid    Yolonda KidaJason Patrick Seab Axel 04/13/2016, 9:16 PM   Maryan RuedJason P Jiovanny Burdell, MD [phone]

## 2016-04-13 NOTE — Evaluation (Signed)
Occupational Therapy Evaluation Patient Details Name: Angela BrilliantJennifer F Dehner MRN: [id number] DOB: 11/02/1975 Today's Date: 04/13/2016    History of Present Illness Pt is a 41 y.o. female s/p Left tibial IM nail, Left ankle ORIF. PMHx: Arthritis, Ehlers-Danlos syndrome type III, Gastic paresis, Migrane, Syncope, L THA, Bil knee sx, L shoulder sx x4.   Clinical Impression   Pt reports she was independent with ADL PTA. Currently pt requires min assist for stand pivot transfer and LB ADL, able to complete UB ADL in sitting with supervision. Pt able to maintain LLE NWB throughout functional activities this session. Pt planning to d/c home with 24/7 supervision from family/friends. Pt would benefit from continued skilled OT to address established goals.    Follow Up Recommendations  No OT follow up;Supervision/Assistance - 24 hour (initially)    Equipment Recommendations  None recommended by OT    Recommendations for Other Services PT consult     Precautions / Restrictions Precautions Precautions: Fall Required Braces or Orthoses: Other Brace/Splint Other Brace/Splint: CAM boot present in room Restrictions Weight Bearing Restrictions: Yes LLE Weight Bearing: Non weight bearing      Mobility Bed Mobility Overal bed mobility: Needs Assistance Bed Mobility: Supine to Sit     Supine to sit: Supervision;HOB elevated     General bed mobility comments: Supervision for safety. HOB slightly elevated without use of bed rail.   Transfers Overall transfer level: Needs assistance Equipment used: 1 person hand held assist Transfers: Sit to/from [employer] CorporationStand;Stand Pivot Transfers Sit to Stand: Min assist Stand pivot transfers: Min assist       General transfer comment: Min assist for balance. Pt able to maintain LLE NWB throughout.    Balance Overall balance assessment: Needs assistance Sitting-balance support: Feet supported;No upper extremity supported Sitting balance-Leahy Scale:  Normal     Standing balance support: Bilateral upper extremity supported Standing balance-Leahy Scale: Poor                              ADL Overall ADL's : Needs assistance/impaired Eating/Feeding: Set up;Sitting   Grooming: Set up;Sitting   Upper Body Bathing: Set up;Supervision/ safety;Sitting   Lower Body Bathing: Minimal assistance;Sit to/from stand   Upper Body Dressing : Set up;Supervision/safety;Sitting   Lower Body Dressing: Minimal assistance;Sit to/from stand Lower Body Dressing Details (indicate cue type and reason): Pt able to adjust sock sitting EOB. Min assist for standing balance Toilet Transfer: Minimal assistance;Stand-pivot;BSC Toilet Transfer Details (indicate cue type and reason): Simulated by stand pivot from EOB to chair with 1 person hand held assist.         Functional mobility during ADLs: Minimal assistance (stand pivot only, 1 person hand held assist) General ADL Comments: SpO2=95% on RA; left supplemental O2 off, RN aware and in agreement.     Vision     Perception     Praxis      Pertinent Vitals/Pain Pain Assessment: 0-10 Pain Score: 5  Pain Location: LLE Pain Descriptors / Indicators: Aching;Sore Pain Intervention(s): Monitored during session;Repositioned     Hand Dominance     Extremity/Trunk Assessment Upper Extremity Assessment Upper Extremity Assessment: Overall WFL for tasks assessed   Lower Extremity Assessment Lower Extremity Assessment: Defer to PT evaluation   Cervical / Trunk Assessment Cervical / Trunk Assessment: Normal   Communication Communication Communication: No difficulties   Cognition Arousal/Alertness: Awake/alert Behavior During Therapy: WFL for tasks assessed/performed Overall Cognitive Status: Within Functional Limits for  tasks assessed                     General Comments       Exercises       Shoulder Instructions      Home Living Family/patient expects to be  discharged to:: Private residence Living Arrangements: Spouse/significant other Available Help at Discharge: Family;Available 24 hours/day;Friend(s) Type of Home: House Home Access: Stairs to enter [employer] of Steps: 4 from front, 1 from back   Home Layout: One level     Bathroom Shower/Tub: Tub/shower unit Shower/tub characteristics: Curtain Firefighter: Standard     Home Equipment: Environmental consultant - 2 wheels;Bedside commode;Tub bench;Adaptive equipment Adaptive Equipment: Reacher;Other (Comment) (leg lifter) Additional Comments: Has access to above equipment; can borrow from a friend.      Prior Functioning/Environment Level of Independence: Independent                 OT Problem List: Decreased strength;Impaired balance (sitting and/or standing);Decreased knowledge of use of DME or AE;Decreased knowledge of precautions;Obesity;Pain   OT Treatment/Interventions: Self-care/ADL training;Energy conservation;DME and/or AE instruction;Therapeutic activities;Patient/family education;Balance training    OT Goals(Current goals can be found in the care plan section) Acute Rehab OT Goals Patient Stated Goal: return home OT Goal Formulation: With patient/family Time For Goal Achievement: 04/27/16 Potential to Achieve Goals: Good ADL Goals Pt Will Perform Lower Body Bathing: with supervision;sit to/from stand Pt Will Perform Lower Body Dressing: with supervision;sit to/from stand Pt Will Transfer to Toilet: with supervision;ambulating;bedside commode (over toilet) Pt Will Perform Toileting - Clothing Manipulation and hygiene: with supervision;sit to/from stand  OT Frequency: Min 2X/week   Barriers to D/C:            Co-evaluation              End of Session Equipment Utilized During Treatment: Gait belt Nurse Communication: Mobility status;Weight bearing status  Activity Tolerance: Patient tolerated treatment well Patient left: in chair;with call  bell/phone within reach;with family/visitor present   Time: 1610-9604 OT Time Calculation (min): 19 min Charges:  OT General Charges $OT Visit: 1 Procedure OT Evaluation $OT Eval Moderate Complexity: 1 Procedure G-Codes:     Gaye Alken M.S., OTR/L Pager: [phone]  04/13/2016, 9:05 AM

## 2016-04-13 NOTE — Op Note (Signed)
Date of Surgery: 04/13/2016  INDICATIONS: [name] is a 41 y.o.-year-old female with a left spiral distal tibia fracture and associated lateral malleolar ankle fracture;  The patient did consent to the procedure after discussion of the risks and benefits.  PREOPERATIVE DIAGNOSIS: 1.  left tibia fracture 2. Left distal fibula fracture  POSTOPERATIVE DIAGNOSIS: Same  PROCEDURE:   1. left tibia closed reduction and intramedullary nailing CPT: 27759  2. Open treatment with internal fixation of left distal fibular fracture, lateral malleolus fracture  SURGEON: Maryan RuedJason P Cartez Mogle, M.D.  ASSISTANT: April Chilton SiGreen , RNFA  ANESTHESIA:  general  IV FLUIDS AND URINE: See anesthesia record.  ESTIMATED BLOOD LOSS: 100 mL.  Tourniquet time: 78 minutes  IMPLANTS: Stryker 9 mm x 375 mm tibial nail, 5mm proximal oblique screws x 2, 5 mm distal interlock x 2   Stryker Variax distal fibular plate and cortical screws 3 nonlocking and 3 locking   DRAINS: None.  COMPLICATIONS: None.  DESCRIPTION OF PROCEDURE: The patient was brought to the operating room and placed supine on the operating table.  [name] had been signed prior to the procedure.  The patient had the anesthesia placed by the anesthesiologist.  The prep verification and incision time-outs were performed to confirm that this was the correct patient, site, side and location. The patient had an SCD on the opposite lower extremity. The patient did receive antibiotics prior to the incision and was re-dosed during the procedure as needed at indicated intervals.  The patient had the lower extremity prepped and draped in the standard surgical fashion.  The incision was first made over the quadriceps tendon in the midline and taken down to the skin and subcutaneous tissue to expose the peritenon. The tendon was incised in line with the skin incision. A knife was then used to longitudinally divide the tendon in line with its fibers,  taking care not to cross over any fibers. Next, using the Stryker suprapatellar instrumentation we assessed for adequate space to use this approach without violating the patellofemoral joint.  Then the guide wire was placed at the proximal, anterior tibia, confirming its location on both AP and lateral views. The wire was drilled into the bone and then the opening reamer was placed over this and maneuvered so that the reamer was parallel with anterior cortex of the tibia. The ball-tipped guide wire was then placed down into the canal towards the fracture site. The fracture was reduced with a large clamp percutaneously and the wire was passed and confirmed to be in the proper location on both AP and lateral views.  The measuring stick was used to measure the length of the nail.  Sequential reaming was then performed, then the nail was gently hammered into place over the guide wire and the guide wire was removed. The proximal screws were placed through the interlocking drill guide using the sleeve. The distal screws were placed using the perfect circles technique. All screws were placed in the standard fashion, first incising the skin and then spreading with a tonsil, then drilling, measuring with a depth gauge, and then placing the screws by hand. The final x-rays were taken in both AP and lateral views to confirm the fracture reduction as well as the placement of all hardware.   Next the fibula fracture was treated.  Due to the low nature of the fracture and its spiral nature we were concerned for syndesmotic injury.  Prior to stress maneuver the lateral malleolar fragment was displaced  more than 75% in both coronal and sagittal planes.  On stress views the mortise was not congruent and the decision was made to use internal fixation on the fibular to stabilize the ankle joint.  The extremity was exsanguinated using an esmarch bandage and the tourniquet was inflated to 300 mm Hg. Incision was made over the distal  fibula and the fracture was exposed and reduced anatomically with a clamp.  The decision was made to bridge the fracture, using the plate to reduce its displacement at improve alignment. I then applied a distal fibular locking plate and secured it proximally and distally with non-locking screws. Bone quality was fair. I used c-arm to confirm satisfactory reduction and fixation.  The syndesmosis was stressed using live fluoroscopy and found to be stable. The wounds were irrigated, and closed with vicryl with routine closure for the skin.  Sterile gauze was applied followed by a posterior splint.    The patient's calf was soft to palpation at the end of the case.  The patient was then transferred to a bed and taken to the recovery room in stable condition.  All counts were correct at the end of the case.  POSTOPERATIVE PLAN: LEIANNA BARGA will remain nonweightbearing on this leg for approximately 6 weeks; She will return for suture removal in 2 weeks.  She will be in a CAM walker at his first follow up appointment.  Ms. Pridgen will receive DVT prophylaxis based on other medications, activity level, and risk ratio of bleeding to thrombosis.   Duwayne Heck, MD [facility], [place]

## 2016-04-13 NOTE — Anesthesia Postprocedure Evaluation (Addendum)
Anesthesia Post Note  Patient: Angela Harrison  Procedure(s) Performed: Procedure(s) (LRB): INTRAMEDULLARY (IM) NAIL TIBIAL LEFT (Left) OPEN REDUCTION INTERNAL FIXATION (ORIF) ANKLE LEFT (Left)  Patient location during evaluation: PACU Anesthesia Type: Regional Level of consciousness: awake and alert Pain management: pain level controlled Vital Signs Assessment: post-procedure vital signs reviewed and stable Respiratory status: spontaneous breathing, nonlabored ventilation, respiratory function stable and patient connected to nasal cannula oxygen Cardiovascular status: blood pressure returned to baseline and stable Postop Assessment: no signs of nausea or vomiting Anesthetic complications: no       Last Vitals:  Vitals:   04/13/16 0200 04/13/16 0456  BP: 101/63 (!) 93/53  Pulse:  80  Resp: 16 16  Temp: 36.4 C 36.5 C    Last Pain:  Vitals:   04/13/16 0545  TempSrc:   PainSc: 5                  Eira Alpert

## 2016-04-14 MED ORDER — METHOCARBAMOL 500 MG PO TABS: 500.0000 mg | ORAL_TABLET | Freq: Four times a day (QID) | ORAL | 0 refills | Status: DC | PRN

## 2016-04-14 MED ORDER — OXYCODONE HCL 5 MG PO TABS: 5.0000 mg | ORAL_TABLET | ORAL | 0 refills | Status: DC | PRN

## 2016-04-14 NOTE — Progress Notes (Signed)
Wheelchair delivered to pt's room for home use. Pt d/c instructions reviewed with pt and her husband. Copy of instructions and scripts given to pt. Pt d/c'd via her wheelchair with her belongings, escorted by unit NT.

## 2016-04-14 NOTE — Discharge Summary (Signed)
Patient ID: Angela Harrison MRN: [id number] DOB/AGE: 1976-02-23 41 y.o.  Admit date: 04/11/2016 Discharge date: 04/14/2016  Primary Diagnosis:  1. Closed fracture of left distal tibia 2. Closed bimalleolar ankle fracture.    Admission Diagnoses:  Past Medical History:  Diagnosis Date  . Arthritis    oa  . Ehlers-Danlos syndrome type III   . Gastric paresis   . Migraine   . Syncope    neurocardiogenic takes toprol and zoloft for   Discharge Diagnoses:   Active Problems:   Closed left tibial fracture   Fracture tibia/fibula, left, closed, initial encounter  Estimated body mass index is 34.1 kg/m as calculated from the following:   Height as of this encounter: 5' 3"  (1.6 m).   Weight as of this encounter: 87.3 kg (192 lb 8 oz).  Procedure:  Procedure(s) (LRB): INTRAMEDULLARY (IM) NAIL TIBIAL LEFT (Left) OPEN REDUCTION INTERNAL FIXATION (ORIF) ANKLE LEFT (Left)   Consults: None  HPI: Angela Harrison is a 41 y.o.-year-old female with a left spiral distal tibia fracture and associated lateral malleolar ankle fracture;  The patient did consent to the procedure after discussion of the risks and benefits. Laboratory Data: Admission on 04/11/2016, Discharged on 04/14/2016  Component Date Value Ref Range Status  . WBC 04/11/2016 12.9* 4.0 - 10.5 K/uL Final  . RBC 04/11/2016 4.27  3.87 - 5.11 MIL/uL Final  . Hemoglobin 04/11/2016 10.2* 12.0 - 15.0 g/dL Final  . HCT 04/11/2016 31.8* 36.0 - 46.0 % Final  . MCV 04/11/2016 74.5* 78.0 - 100.0 fL Final  . MCH 04/11/2016 23.9* 26.0 - 34.0 pg Final  . MCHC 04/11/2016 32.1  30.0 - 36.0 g/dL Final  . RDW 04/11/2016 16.0* 11.5 - 15.5 % Final  . Platelets 04/11/2016 400  150 - 400 K/uL Final  . Neutrophils Relative % 04/11/2016 85  % Final  . Neutro Abs 04/11/2016 10.9* 1.7 - 7.7 K/uL Final  . Lymphocytes Relative 04/11/2016 9  % Final  . Lymphs Abs 04/11/2016 1.2  0.7 - 4.0 K/uL Final  . Monocytes Relative 04/11/2016 6  % Final  .  Monocytes Absolute 04/11/2016 0.8  0.1 - 1.0 K/uL Final  . Eosinophils Relative 04/11/2016 0  % Final  . Eosinophils Absolute 04/11/2016 0.0  0.0 - 0.7 K/uL Final  . Basophils Relative 04/11/2016 0  % Final  . Basophils Absolute 04/11/2016 0.0  0.0 - 0.1 K/uL Final  . Sodium 04/11/2016 137  135 - 145 mmol/L Final  . Potassium 04/11/2016 3.9  3.5 - 5.1 mmol/L Final  . Chloride 04/11/2016 105  101 - 111 mmol/L Final  . CO2 04/11/2016 26  22 - 32 mmol/L Final  . Glucose, Bld 04/11/2016 125* 65 - 99 mg/dL Final  . BUN 04/11/2016 13  6 - 20 mg/dL Final  . Creatinine, Ser 04/11/2016 0.88  0.44 - 1.00 mg/dL Final  . Calcium 04/11/2016 8.8* 8.9 - 10.3 mg/dL Final  . GFR calc non Af Amer 04/11/2016 >60  >60 mL/min Final  . GFR calc Af Amer 04/11/2016 >60  >60 mL/min Final   Comment: (NOTE) The eGFR has been calculated using the CKD EPI equation. This calculation has not been validated in all clinical situations. eGFR's persistently <60 mL/min signify possible Chronic Kidney Disease.   . Anion gap 04/11/2016 6  5 - 15 Final  . MRSA, PCR 04/12/2016 NEGATIVE  NEGATIVE Final  . Staphylococcus aureus 04/12/2016 NEGATIVE  NEGATIVE Final   Comment:  The Xpert SA Assay (FDA approved for NASAL specimens in patients over 89 years of age), is one component of a comprehensive surveillance program.  Test performance has been validated by [facility] for patients greater than or equal to 45 year old. It is not intended to diagnose infection nor to guide or monitor treatment.   . WBC 04/12/2016 9.7  4.0 - 10.5 K/uL Final  . RBC 04/12/2016 4.10  3.87 - 5.11 MIL/uL Final  . Hemoglobin 04/12/2016 9.7* 12.0 - 15.0 g/dL Final  . HCT 04/12/2016 31.5* 36.0 - 46.0 % Final  . MCV 04/12/2016 76.8* 78.0 - 100.0 fL Final  . MCH 04/12/2016 23.7* 26.0 - 34.0 pg Final  . MCHC 04/12/2016 30.8  30.0 - 36.0 g/dL Final  . RDW 04/12/2016 16.5* 11.5 - 15.5 % Final  . Platelets 04/12/2016 266  150 - 400 K/uL  Final  . Creatinine, Ser 04/12/2016 0.54  0.44 - 1.00 mg/dL Final  . GFR calc non Af Amer 04/12/2016 >60  >60 mL/min Final  . GFR calc Af Amer 04/12/2016 >60  >60 mL/min Final   Comment: (NOTE) The eGFR has been calculated using the CKD EPI equation. This calculation has not been validated in all clinical situations. eGFR's persistently <60 mL/min signify possible Chronic Kidney Disease.      X-Rays:Dg Tibia/fibula Left  Result Date: 04/12/2016 CLINICAL DATA:  IM nail placement, ORIF left tibia EXAM: DG C-ARM 61-120 MIN; LEFT TIBIA AND FIBULA - 2 VIEW COMPARISON:  None FLUOROSCOPY TIME:  2 minutes 12 seconds FINDINGS: Interval placement of a left tibial intramedullary nail with interlocking screws transfixing an oblique distal tibial diametaphyseal fracture in near anatomic alignment. No hardware failure or complication. Distal lateral fibular sideplate with multiple interlocking screws transfixing a nondisplaced distal fibular metaphysis fracture. IMPRESSION: Interval ORIF of a left distal tibial fracture. Interval ORIF of a left distal fibular fracture. Electronically Signed   By: Kathreen Devoid   On: 04/12/2016 14:13   Dg Tibia/fibula Left  Result Date: 04/11/2016 CLINICAL DATA:  Fall EXAM: LEFT TIBIA AND FIBULA - 2 VIEW COMPARISON:  None. FINDINGS: Spiral fracture distal tibia with displacement. This does not extend into the ankle joint. Fracture of the distal fibula and posterior malleolus. Chondrocalcinosis in the knee with mild joint space narrowing. IMPRESSION: Spiral fracture distal tibia. Fracture distal fibula and posterior malleolus. Electronically Signed   By: Franchot Gallo M.D.   On: 04/11/2016 18:22   Dg Ankle Complete Left  Result Date: 04/11/2016 CLINICAL DATA:  Fall EXAM: LEFT ANKLE COMPLETE - 3+ VIEW COMPARISON:  None FINDINGS: Spiral fracture distal tibia not extending into the ankle joint. Posterior malleolar fracture. Oblique fracture distal fibula with mild displacement.  Ankle mortise intact. IMPRESSION: Spiral fracture distal tibia with displacement. Fracture of the distal fibula and posterior malleolus. Electronically Signed   By: Franchot Gallo M.D.   On: 04/11/2016 18:21   Ct Ankle Left Wo Contrast  Result Date: 04/11/2016 CLINICAL DATA:  Tripped over tree branch and fracturing left leg. EXAM: CT OF THE LEFT ANKLE WITHOUT CONTRAST TECHNIQUE: Multidetector CT imaging of the left ankle was performed according to the standard protocol. Multiplanar CT image reconstructions were also generated. COMPARISON:  Same day radiographs of the left tibia and fibula. FINDINGS: Bones/Joint/Cartilage Nondisplaced posterior malleolar fracture of the distal tibia. Laterally displaced spiral fracture of the distal tibial diaphysis acute closed lateral fracture of the distal tibial diaphysis with 1 shaft with lateral displacement of the main fracture fragment along  its proximal aspect and approximately 1/4 shaft with distally. Oblique fracture of the distal fibular diaphysis with dorsal and medial displacement of the fracture fragment by 1/4 shaft with. The ankle mortise is maintained. Small osteochondral defect of the lateral talar dome. No fracture of the visualized calcaneus. Subtalar joint is maintained. Ligaments Suboptimally assessed by CT. Muscles and Tendons Diffuse soft tissue edema.  No intramuscular hemorrhage. Soft tissues Diffuse soft tissue swelling about the malleoli. IMPRESSION: 1. Acute closed fractures of the distal diaphysis of the tibia and fibula with 1 shaft with lateral displacement of the main tibial fracture fragment proximally and 1/4 shaft with distally. 2. Oblique fracture of the distal fibular diaphysis with dorsal medial displacement. 3. Nondisplaced posterior malleolar fracture. 4. Small osteochondral defect of the lateral talar dome. 5. The ankle mortise is congruent. Electronically Signed   By: Ashley Royalty M.D.   On: 04/11/2016 21:07   Dg Ankle Left  Port  Result Date: 04/12/2016 CLINICAL DATA:  Postop left ankle plate and screws. EXAM: PORTABLE LEFT ANKLE - 2 VIEW COMPARISON:  Plain film of the left ankle dated 04/11/2016. FINDINGS: Interval placement of intramedullary rod traversing the distal left tibia fracture site, with associated fixation screws. Interval placement of additional plate and screw fixation hardware traversing the distal fibula fracture site. Hardware appears intact and appropriately positioned. Overall osseous alignment is significantly improved. Ankle mortise is symmetric. IMPRESSION: Status post hardware fixation of the distal left tibia and fibula fracture sites. Hardware appears intact and appropriately positioned. Osseous alignment is significantly improved. No evidence of surgical complicating feature. Electronically Signed   By: Franki Cabot M.D.   On: 04/12/2016 15:08   Dg C-arm 61-120 Min  Result Date: 04/12/2016 CLINICAL DATA:  IM nail placement, ORIF left tibia EXAM: DG C-ARM 61-120 MIN; LEFT TIBIA AND FIBULA - 2 VIEW COMPARISON:  None FLUOROSCOPY TIME:  2 minutes 12 seconds FINDINGS: Interval placement of a left tibial intramedullary nail with interlocking screws transfixing an oblique distal tibial diametaphyseal fracture in near anatomic alignment. No hardware failure or complication. Distal lateral fibular sideplate with multiple interlocking screws transfixing a nondisplaced distal fibular metaphysis fracture. IMPRESSION: Interval ORIF of a left distal tibial fracture. Interval ORIF of a left distal fibular fracture. Electronically Signed   By: Kathreen Devoid   On: 04/12/2016 14:13    EKG:No orders found for this or any previous visit.   Hospital Course: VALE PERAZA is a 41 y.o. who was admitted to Hospital. They were brought to the operating room on 04/11/2016 - 04/12/2016 and underwent Procedure(s): INTRAMEDULLARY (IM) NAIL TIBIAL LEFT OPEN REDUCTION INTERNAL FIXATION (ORIF) ANKLE LEFT.  Patient tolerated  the procedure well and was later transferred to the recovery room and then to the orthopaedic floor for postoperative care.  They were given PO and IV analgesics for pain control following their surgery.  They were given 24 hours of postoperative antibiotics of  Anti-infectives    Start     Dose/Rate Route Frequency Ordered Stop   04/12/16 2000  clindamycin (CLEOCIN) IVPB 600 mg     600 mg 100 mL/hr over 30 Minutes Intravenous Every 6 hours 04/12/16 1941 04/13/16 1020     and started on DVT prophylaxis in the form of Lovenox.   PT and OT were ordered.  Discharge planning consulted to help with postop disposition and equipment needs.  Patient had a good night on the evening of surgery.  They started to get up OOB with therapy on day  one.  Patient was seen in rounds and was ready to go home on postoperative day #2.   Diet: Regular diet Activity: Nonweightbearing left lower extremity. Follow-up:in 2 weeks Disposition - Home Discharged Condition: good   Discharge Instructions    Call MD / Call 911    Complete by:  As directed    If you experience chest pain or shortness of breath, CALL 911 and be transported to the hospital emergency room.  If you develope a fever above 101 F, pus (white drainage) or increased drainage or redness at the wound, or calf pain, call your surgeon's office.   Constipation Prevention    Complete by:  As directed    Drink plenty of fluids.  Prune juice may be helpful.  You may use a stool softener, such as Colace (over the counter) 100 mg twice a day.  Use MiraLax (over the counter) for constipation as needed.   Diet - low sodium heart healthy    Complete by:  As directed    Discharge instructions    Complete by:  As directed    -keep wounds clean and dry until follow up -no weight bearing to the left leg for 6 weeks -for blood clot prevention take 325 mg aspirin 2 times daily -elevate the extremity.   Increase activity slowly as tolerated    Complete by:  As  directed      Allergies as of 04/14/2016      Reactions   Cephalosporins Hives, Swelling, Rash, Other (See Comments)   Can tolerate Penicillin   Cefazolin Hives, Swelling, Rash, Other (See Comments)   Can tolerate Penicillin   Cephalexin Hives, Swelling, Rash, Other (See Comments)   Can tolerate Penicillin      Medication List    TAKE these medications   cyclobenzaprine 10 MG tablet Commonly known as:  FLEXERIL Take 1 tablet (10 mg total) by mouth 3 (three) times daily as needed for muscle spasms.   docusate sodium 100 MG capsule Commonly known as:  COLACE Take 1 capsule (100 mg total) by mouth 2 (two) times daily.   ferrous sulfate 325 (65 FE) MG tablet Take 1 tablet (325 mg total) by mouth 3 (three) times daily after meals.   HYDROcodone-acetaminophen 7.5-325 MG tablet Commonly known as:  NORCO Take 1-2 tablets by mouth every 4 (four) hours as needed for moderate pain.   HYDROcodone-acetaminophen 5-325 MG tablet Commonly known as:  NORCO/VICODIN Take 1 tablet by mouth every 6 (six) hours as needed. for pain   ibuprofen 200 MG tablet Commonly known as:  ADVIL,MOTRIN Take 600 mg by mouth every 6 (six) hours as needed for moderate pain.   IMITREX 6 MG/0.5ML Soln injection Generic drug:  SUMAtriptan Inject 6 mg into the skin every 2 (two) hours as needed for migraine or headache. May repeat in 2 hours if headache persists or recurs.   meclizine 25 MG tablet Commonly known as:  ANTIVERT Take 25 mg by mouth 3 (three) times daily as needed for dizziness.   methocarbamol 500 MG tablet Commonly known as:  ROBAXIN Take 500 mg by mouth every 6 (six) hours as needed for muscle spasms. What changed:  Another medication with the same name was added. Make sure you understand how and when to take each.   methocarbamol 500 MG tablet Commonly known as:  ROBAXIN Take 1 tablet (500 mg total) by mouth every 6 (six) hours as needed for muscle spasms. What changed:  You were already  taking  a medication with the same name, and this prescription was added. Make sure you understand how and when to take each.   metoCLOPramide 10 MG tablet Commonly known as:  REGLAN Take 1 tablet (10 mg total) by mouth every 6 (six) hours as needed for nausea or vomiting.   metoprolol succinate 50 MG 24 hr tablet Commonly known as:  TOPROL-XL TAKE 1 TABLET (50 MG TOTAL) BY MOUTH EVERY EVENING.   ondansetron 8 MG tablet Commonly known as:  ZOFRAN Take 8 mg by mouth every 8 (eight) hours as needed for nausea or vomiting.   oxyCODONE 5 MG immediate release tablet Commonly known as:  Oxy IR/ROXICODONE Take 1-3 tablets (5-15 mg total) by mouth every 4 (four) hours as needed for breakthrough pain.   polyethylene glycol packet Commonly known as:  MIRALAX / GLYCOLAX Take 17 g by mouth 2 (two) times daily.   prochlorperazine 10 MG tablet Commonly known as:  COMPAZINE Take 10 mg by mouth every 6 (six) hours as needed for nausea or vomiting.   promethazine 25 MG suppository Commonly known as:  PHENERGAN Place 25 mg rectally every 6 (six) hours as needed for nausea or vomiting.   sertraline 100 MG tablet Commonly known as:  ZOLOFT Take 50 mg by mouth at bedtime.   sodium chloride 1 g tablet Take 1 g by mouth daily.            Durable Medical Equipment        Start     Ordered   04/14/16 1457  For home use only DME standard manual wheelchair with seat cushion  Once    Comments:  Patient suffers from tib/fib fracture , s/p orif which impairs their ability to perform daily activities like , pt is non weight bearing(l)} in the home.  A rolling walker will not resolve  issue with performing activities of daily living. A wheelchair will allow patient to safely perform daily activities. Patient can safely propel the wheelchair in the home or has a caregiver who can provide assistance.  Accessories: elevating leg rests (ELRs), wheel locks, extensions and anti-tippers.   04/14/16 [place], MD. Schedule an appointment as soon as possible for a visit in 2 week(s).   Specialty:  Orthopedic Surgery Contact information: [address] [place] [address] [phone]        Inc[facility] - [facility] Follow up.   Why:   wheelchair to be delivered to bedside prior to discharge Contact information: [address] [place] [address] [phone]           Signed: Geralynn Rile, MD Orthopaedic Surgery 04/14/2016, 5:56 PM

## 2016-04-14 NOTE — Care Management Note (Signed)
Case Management Note  Patient Details  Name: Angela Harrison MRN: [id number] Date of Birth: 02/11/1976  Subjective/Objective:                    Action/Plan:   Expected Discharge Date:  04/14/16               Expected Discharge Plan:  Home w Home Health Services  In-House Referral:     Discharge planning Services  CM Consult  Post Acute Care Choice:  Home Health Choice offered to:  Patient  DME Arranged:  Wheelchair manual DME Agency:  [facility]/ [facility] [phone] per CM. Wheelchair to be delivered to bedside.  HH Arranged:  [facility]  Status of Service:  Completed, signed off  If discussed at Long Length of Stay Meetings, dates discussed:    Additional Comments:  Epifanio LeschesCole, Catha Ontko [place], RN 04/14/2016, 3:02 PM

## 2016-04-14 NOTE — Progress Notes (Signed)
Paged MD for [facility] about patient's scheduled tylenol dosage of receiving more than 4 grams of acetaminophen in 24 hours. Carmen,MD returned page and told RN to hold both 12AM and 6AM doses of tylenol for this patient in order to prevent the patient from receiving more than 4grams of acetaminophen in 24 hours. Will continue to monitor and treat per MD orders.

## 2016-04-14 NOTE — Progress Notes (Signed)
Physical Therapy Treatment Patient Details Name: Angela Harrison MRN: [id number] DOB: 09/25/1975 Today's Date: 04/14/2016    History of Present Illness Pt is a 41 y.o. female s/p Left tibial IM nail, Left ankle ORIF. PMHx: Arthritis, Ehlers-Danlos syndrome type III, Gastic paresis, Migrane, Syncope, L THA, Bil knee sx, L shoulder sx x4.    PT Comments    Pt performed short bout of gait and functional mobility with min assist for correct weight bearing and moving LLE.  Pt required cues for NWB and at this time will require a WC for safe mobility around her home, as she will be NWB for 6 weeks.  Informed case Production designer, theatre/television/filmmanager and RN of equipment needs.  Will inform supervising PT of change in equipment recommendations.    Follow Up Recommendations  Home health PT;Supervision for mobility/OOB     Equipment Recommendations  Wheelchair cushion (measurements PT);Wheelchair (measurements PT) (To d/[place] home [facility] for maintenance of NWB status.  )    Recommendations for Other Services       Precautions / Restrictions Precautions Precautions: Fall Required Braces or Orthoses: Other Brace/Splint Other Brace/Splint: CAM boot present in room, no orders regarding Restrictions Weight Bearing Restrictions: Yes LLE Weight Bearing: Non weight bearing    Mobility  Bed Mobility Overal bed mobility: Needs Assistance Bed Mobility: Supine to Sit;Sit to Supine     Supine to sit: Supervision;HOB elevated Sit to supine: Supervision (with use of gait belt as a leg lift.  )   General bed mobility comments: Pt required self assistance to use gait belt or hands to assist LLE secondary to pain and increased weight from CAM boot (L).    Transfers Overall transfer level: Needs assistance Equipment used: Rolling walker (2 wheeled);None (performed sit to stand with RW and squat pivot from drop arm chair to bed. ) Transfers: Sit to/from [employer] CorporationStand;Squat Pivot Transfers Sit to Stand: Min assist   Squat pivot  transfers: Modified independent (Device/Increase time)     General transfer comment: Pt required min assist to ascend from edge of bed and chair.  Assistance required to keep LLE NWB during transition.  With leg lifter patient able to perform squat pivot transfer with independence.    Ambulation/Gait Ambulation/Gait assistance: Min guard Ambulation Distance (Feet): 20 Feet (Pt fatigues quickly and will benefit from [facility] with elevating leg rest to help maintain L NWB status.  ) Assistive device: Rolling walker (2 wheeled) Gait Pattern/deviations: Step-to pattern;Trunk flexed;Antalgic Gait velocity: decreased   General Gait Details: Pt required cues for sequencing, upper trunk control and VCs to maintain NWB.     Stairs Stairs: Yes   Stair Management: No rails;Backwards;Step to pattern Number of Stairs: 1 General stair comments: Cues for sequencing and RW placement, husband present to observe sequencing.    Wheelchair Mobility    Modified Rankin (Stroke Patients Only)       Balance Overall balance assessment: Needs assistance Sitting-balance support: Feet supported;No upper extremity supported Sitting balance-Leahy Scale: Normal       Standing balance-Leahy Scale: Poor Standing balance comment: Heavy reliance on UEs with poor ability to maintain NWB for an extended period of time.                      Cognition Arousal/Alertness: Awake/alert Behavior During Therapy: WFL for tasks assessed/performed Overall Cognitive Status: Within Functional Limits for tasks assessed  Exercises      General Comments        Pertinent Vitals/Pain Pain Assessment: No/denies pain Pain Score: 6  Pain Location: Lt knee  Pain Descriptors / Indicators: Aching Pain Intervention(s): Monitored during session;Repositioned;Patient requesting pain meds-RN notified    Home Living                      Prior Function            PT Goals  (current goals can now be found in the care plan section) Acute Rehab PT Goals Patient Stated Goal: return home Potential to Achieve Goals: Good    Frequency    Min 5X/week      PT Plan Current plan remains appropriate    Co-evaluation             End of Session Equipment Utilized During Treatment: Gait belt Activity Tolerance: Patient tolerated treatment well Patient left: in bed;with call bell/phone within reach;with family/visitor present     Time: 1610-9604 PT Time Calculation (min) (ACUTE ONLY): 30 min  Charges:  $Gait Training: 8-22 mins $Therapeutic Activity: 8-22 mins                    G Codes:      Florestine Avers 05-04-16, 3:36 PM  Joycelyn Rua, PTA pager [phone]

## 2016-04-14 NOTE — Progress Notes (Signed)
Occupational Therapy Treatment Patient Details Name: Angela Harrison MRN: [id number] DOB: May 07, 1975 Today's Date: 04/14/2016    History of present illness Pt is a 41 y.o. female s/p Left tibial IM nail, Left ankle ORIF. PMHx: Arthritis, Ehlers-Danlos syndrome type III, Gastic paresis, Migrane, Syncope, L THA, Bil knee sx, L shoulder sx x4.   OT comments  Pt making good progress towards OT goals, continue plan of care for now.   Follow Up Recommendations  No OT follow up;Supervision/Assistance - 24 hour (initially)    Equipment Recommendations  None recommended by OT (pt has 3-n-1 and tub transfer bench)    Recommendations for Other Services  None at this time   Precautions / Restrictions Precautions Precautions: Fall Required Braces or Orthoses: Other Brace/Splint Other Brace/Splint: CAM boot present in room, no orders regarding Restrictions Weight Bearing Restrictions: Yes LLE Weight Bearing: Non weight bearing    Mobility Bed Mobility  Overal bed mobility: Needs Assistance Bed Mobility: Supine to Sit     Supine to sit: Supervision;HOB elevated   General bed mobility comments: Pt required self assistance to use hands to assist LLE secondary to pain and increased weight from CAM boot (L).    Transfers Overall transfer level: Needs assistance Equipment used: Rolling walker (2 wheeled) Transfers: Sit to/from Stand Sit to Stand: Min guard       General transfer comment: Min guard for safety.     Balance Overall balance assessment: Needs assistance Sitting-balance support: Feet supported;No upper extremity supported Sitting balance-Leahy Scale: Normal     Standing balance support: Bilateral upper extremity supported Standing balance-Leahy Scale: Poor Standing balance comment: Heavy reliance on UEs with poor ability to maintain NWB for an extended period of time.     ADL Overall ADL's : Needs assistance/impaired Eating/Feeding: Set up;Sitting   Grooming: Set  up;Sitting   Upper Body Bathing: Set up;Sitting   Lower Body Bathing: Minimal assistance;Sit to/from stand   Upper Body Dressing : Set up;Sitting   Lower Body Dressing: Minimal assistance;Sit to/from stand Lower Body Dressing Details (indicate cue type and reason): Min assist for standing balance Toilet Transfer: Stand-pivot;BSC;Min Pension scheme manager Details (indicate cue type and reason): Simulated EOB to ambulation to w/c transfer Toileting- Clothing Manipulation and Hygiene: Minimal assistance;Sit to/from Nurse, children's Details (indicate cue type and reason): Did not occur, however discussed use of tub transfer bench in tub/shower combo when pt able and ready to shower.  Functional mobility during ADLs: Min guard;Rolling walker General ADL Comments: Tech in room to administer w/c. W/c with regular leg rests, recommending elevating leg rests and notified tech and nurse of this.            Cognition   Behavior During Therapy: WFL for tasks assessed/performed Overall Cognitive Status: Within Functional Limits for tasks assessed                 Pertinent Vitals/ Pain       Pain Assessment: Faces Pain Score: 6  Faces Pain Scale: Hurts even more Pain Location: LLE Pain Descriptors / Indicators: Aching;Grimacing;Guarding Pain Intervention(s): Limited activity within patient's tolerance;Monitored during session;Repositioned         Frequency Min 2X/week        Progress Toward Goals  OT Goals(current goals can now be found in the care plan section)  Progress towards OT goals: Progressing toward goals  Acute Rehab OT Goals Patient Stated Goal: return home OT Goal Formulation: With patient/family Time For Goal  Achievement: 04/27/16 Potential to Achieve Goals: Good  Plan Discharge plan remains appropriate       End of Session Equipment Utilized During Treatment: Rolling walker   Activity Tolerance Patient tolerated treatment well   Patient  Left in chair;with call bell/phone within reach;with family/visitor present   Nurse Communication Mobility status;Other (comment) (need for ELR)     Time: [id number] OT Time Calculation (min): 20 min  Charges: OT General Charges $OT Visit: 1 Procedure OT Treatments $Self Care/Home Management : 8-22 mins  Angela Harrison , MS, OTR/L, [place] Pager: [phone] 04/14/2016, 4:59 PM

## 2016-04-15 DIAGNOSIS — M1991 Primary osteoarthritis, unspecified site: Secondary | ICD-10-CM | POA: Diagnosis not present

## 2016-04-15 DIAGNOSIS — S82842D Displaced bimalleolar fracture of left lower leg, subsequent encounter for closed fracture with routine healing: Secondary | ICD-10-CM | POA: Diagnosis not present

## 2016-04-15 DIAGNOSIS — S82302D Unspecified fracture of lower end of left tibia, subsequent encounter for closed fracture with routine healing: Secondary | ICD-10-CM | POA: Diagnosis not present

## 2016-04-15 DIAGNOSIS — S82832D Other fracture of upper and lower end of left fibula, subsequent encounter for closed fracture with routine healing: Secondary | ICD-10-CM | POA: Diagnosis not present

## 2016-04-17 ENCOUNTER — Encounter (HOSPITAL_COMMUNITY): Payer: Self-pay | Admitting: Orthopedic Surgery

## 2016-04-18 DIAGNOSIS — S82842D Displaced bimalleolar fracture of left lower leg, subsequent encounter for closed fracture with routine healing: Secondary | ICD-10-CM | POA: Diagnosis not present

## 2016-04-18 DIAGNOSIS — M1991 Primary osteoarthritis, unspecified site: Secondary | ICD-10-CM | POA: Diagnosis not present

## 2016-04-18 DIAGNOSIS — S82302D Unspecified fracture of lower end of left tibia, subsequent encounter for closed fracture with routine healing: Secondary | ICD-10-CM | POA: Diagnosis not present

## 2016-04-18 DIAGNOSIS — S82832D Other fracture of upper and lower end of left fibula, subsequent encounter for closed fracture with routine healing: Secondary | ICD-10-CM | POA: Diagnosis not present

## 2016-04-19 DIAGNOSIS — S82832D Other fracture of upper and lower end of left fibula, subsequent encounter for closed fracture with routine healing: Secondary | ICD-10-CM | POA: Diagnosis not present

## 2016-04-19 DIAGNOSIS — S82302D Unspecified fracture of lower end of left tibia, subsequent encounter for closed fracture with routine healing: Secondary | ICD-10-CM | POA: Diagnosis not present

## 2016-04-19 DIAGNOSIS — S82842D Displaced bimalleolar fracture of left lower leg, subsequent encounter for closed fracture with routine healing: Secondary | ICD-10-CM | POA: Diagnosis not present

## 2016-04-19 DIAGNOSIS — M1991 Primary osteoarthritis, unspecified site: Secondary | ICD-10-CM | POA: Diagnosis not present

## 2016-04-21 DIAGNOSIS — S82832D Other fracture of upper and lower end of left fibula, subsequent encounter for closed fracture with routine healing: Secondary | ICD-10-CM | POA: Diagnosis not present

## 2016-04-21 DIAGNOSIS — M1991 Primary osteoarthritis, unspecified site: Secondary | ICD-10-CM | POA: Diagnosis not present

## 2016-04-21 DIAGNOSIS — S82842D Displaced bimalleolar fracture of left lower leg, subsequent encounter for closed fracture with routine healing: Secondary | ICD-10-CM | POA: Diagnosis not present

## 2016-04-21 DIAGNOSIS — S82302D Unspecified fracture of lower end of left tibia, subsequent encounter for closed fracture with routine healing: Secondary | ICD-10-CM | POA: Diagnosis not present

## 2016-04-26 DIAGNOSIS — M1991 Primary osteoarthritis, unspecified site: Secondary | ICD-10-CM | POA: Diagnosis not present

## 2016-04-26 DIAGNOSIS — S82842D Displaced bimalleolar fracture of left lower leg, subsequent encounter for closed fracture with routine healing: Secondary | ICD-10-CM | POA: Diagnosis not present

## 2016-04-26 DIAGNOSIS — S82302D Unspecified fracture of lower end of left tibia, subsequent encounter for closed fracture with routine healing: Secondary | ICD-10-CM | POA: Diagnosis not present

## 2016-04-26 DIAGNOSIS — S82832D Other fracture of upper and lower end of left fibula, subsequent encounter for closed fracture with routine healing: Secondary | ICD-10-CM | POA: Diagnosis not present

## 2016-04-29 DIAGNOSIS — S82832D Other fracture of upper and lower end of left fibula, subsequent encounter for closed fracture with routine healing: Secondary | ICD-10-CM | POA: Diagnosis not present

## 2016-04-29 DIAGNOSIS — S82842D Displaced bimalleolar fracture of left lower leg, subsequent encounter for closed fracture with routine healing: Secondary | ICD-10-CM | POA: Diagnosis not present

## 2016-04-29 DIAGNOSIS — M1991 Primary osteoarthritis, unspecified site: Secondary | ICD-10-CM | POA: Diagnosis not present

## 2016-04-29 DIAGNOSIS — S82302D Unspecified fracture of lower end of left tibia, subsequent encounter for closed fracture with routine healing: Secondary | ICD-10-CM | POA: Diagnosis not present

## 2016-05-12 DIAGNOSIS — S82242D Displaced spiral fracture of shaft of left tibia, subsequent encounter for closed fracture with routine healing: Secondary | ICD-10-CM | POA: Diagnosis not present

## 2016-05-15 DIAGNOSIS — M25572 Pain in left ankle and joints of left foot: Secondary | ICD-10-CM | POA: Diagnosis not present

## 2016-05-15 DIAGNOSIS — S82202A Unspecified fracture of shaft of left tibia, initial encounter for closed fracture: Secondary | ICD-10-CM | POA: Diagnosis not present

## 2016-05-18 DIAGNOSIS — M25572 Pain in left ankle and joints of left foot: Secondary | ICD-10-CM | POA: Diagnosis not present

## 2016-05-23 DIAGNOSIS — M25572 Pain in left ankle and joints of left foot: Secondary | ICD-10-CM | POA: Diagnosis not present

## 2016-05-25 DIAGNOSIS — M25572 Pain in left ankle and joints of left foot: Secondary | ICD-10-CM | POA: Diagnosis not present

## 2016-05-30 DIAGNOSIS — M25572 Pain in left ankle and joints of left foot: Secondary | ICD-10-CM | POA: Diagnosis not present

## 2016-06-01 DIAGNOSIS — M25572 Pain in left ankle and joints of left foot: Secondary | ICD-10-CM | POA: Diagnosis not present

## 2016-06-08 DIAGNOSIS — M25572 Pain in left ankle and joints of left foot: Secondary | ICD-10-CM | POA: Diagnosis not present

## 2016-06-09 DIAGNOSIS — S82242D Displaced spiral fracture of shaft of left tibia, subsequent encounter for closed fracture with routine healing: Secondary | ICD-10-CM | POA: Diagnosis not present

## 2016-06-09 DIAGNOSIS — S82832D Other fracture of upper and lower end of left fibula, subsequent encounter for closed fracture with routine healing: Secondary | ICD-10-CM | POA: Diagnosis not present

## 2016-06-12 DIAGNOSIS — S82202A Unspecified fracture of shaft of left tibia, initial encounter for closed fracture: Secondary | ICD-10-CM | POA: Diagnosis not present

## 2016-06-13 DIAGNOSIS — Z09 Encounter for follow-up examination after completed treatment for conditions other than malignant neoplasm: Secondary | ICD-10-CM | POA: Diagnosis not present

## 2016-06-14 DIAGNOSIS — M25572 Pain in left ankle and joints of left foot: Secondary | ICD-10-CM | POA: Diagnosis not present

## 2016-06-20 DIAGNOSIS — M25572 Pain in left ankle and joints of left foot: Secondary | ICD-10-CM | POA: Diagnosis not present

## 2016-06-22 DIAGNOSIS — M25572 Pain in left ankle and joints of left foot: Secondary | ICD-10-CM | POA: Diagnosis not present

## 2016-06-29 DIAGNOSIS — M25572 Pain in left ankle and joints of left foot: Secondary | ICD-10-CM | POA: Diagnosis not present

## 2016-07-04 DIAGNOSIS — M25572 Pain in left ankle and joints of left foot: Secondary | ICD-10-CM | POA: Diagnosis not present

## 2016-07-06 DIAGNOSIS — M25572 Pain in left ankle and joints of left foot: Secondary | ICD-10-CM | POA: Diagnosis not present

## 2016-07-10 DIAGNOSIS — S82832D Other fracture of upper and lower end of left fibula, subsequent encounter for closed fracture with routine healing: Secondary | ICD-10-CM | POA: Diagnosis not present

## 2016-07-10 DIAGNOSIS — S82242D Displaced spiral fracture of shaft of left tibia, subsequent encounter for closed fracture with routine healing: Secondary | ICD-10-CM | POA: Diagnosis not present

## 2016-07-12 DIAGNOSIS — M25572 Pain in left ankle and joints of left foot: Secondary | ICD-10-CM | POA: Diagnosis not present

## 2016-07-13 DIAGNOSIS — S82202A Unspecified fracture of shaft of left tibia, initial encounter for closed fracture: Secondary | ICD-10-CM | POA: Diagnosis not present

## 2016-07-18 DIAGNOSIS — M25572 Pain in left ankle and joints of left foot: Secondary | ICD-10-CM | POA: Diagnosis not present

## 2016-07-20 DIAGNOSIS — M25572 Pain in left ankle and joints of left foot: Secondary | ICD-10-CM | POA: Diagnosis not present

## 2016-07-25 DIAGNOSIS — M25572 Pain in left ankle and joints of left foot: Secondary | ICD-10-CM | POA: Diagnosis not present

## 2016-07-27 DIAGNOSIS — M25572 Pain in left ankle and joints of left foot: Secondary | ICD-10-CM | POA: Diagnosis not present

## 2016-08-01 DIAGNOSIS — M25572 Pain in left ankle and joints of left foot: Secondary | ICD-10-CM | POA: Diagnosis not present

## 2016-08-03 DIAGNOSIS — M25572 Pain in left ankle and joints of left foot: Secondary | ICD-10-CM | POA: Diagnosis not present

## 2016-08-08 DIAGNOSIS — S82242K Displaced spiral fracture of shaft of left tibia, subsequent encounter for closed fracture with nonunion: Secondary | ICD-10-CM | POA: Diagnosis not present

## 2016-08-11 DIAGNOSIS — S82392K Other fracture of lower end of left tibia, subsequent encounter for closed fracture with nonunion: Secondary | ICD-10-CM | POA: Diagnosis not present

## 2016-08-11 DIAGNOSIS — Z4789 Encounter for other orthopedic aftercare: Secondary | ICD-10-CM | POA: Diagnosis not present

## 2016-08-12 DIAGNOSIS — S82202A Unspecified fracture of shaft of left tibia, initial encounter for closed fracture: Secondary | ICD-10-CM | POA: Diagnosis not present

## 2016-08-14 DIAGNOSIS — J209 Acute bronchitis, unspecified: Secondary | ICD-10-CM | POA: Diagnosis not present

## 2016-08-15 DIAGNOSIS — M25572 Pain in left ankle and joints of left foot: Secondary | ICD-10-CM | POA: Diagnosis not present

## 2016-08-24 DIAGNOSIS — M25572 Pain in left ankle and joints of left foot: Secondary | ICD-10-CM | POA: Diagnosis not present

## 2016-08-28 NOTE — Addendum Note (Signed)
Addendum  created 08/28/16 1007 by Val EagleMoser, Read Bonelli, MD   Sign clinical note

## 2016-09-08 DIAGNOSIS — Z4789 Encounter for other orthopedic aftercare: Secondary | ICD-10-CM | POA: Diagnosis not present

## 2016-09-08 DIAGNOSIS — M1611 Unilateral primary osteoarthritis, right hip: Secondary | ICD-10-CM | POA: Diagnosis not present

## 2016-09-08 DIAGNOSIS — S82832D Other fracture of upper and lower end of left fibula, subsequent encounter for closed fracture with routine healing: Secondary | ICD-10-CM | POA: Diagnosis not present

## 2016-09-08 DIAGNOSIS — S82392K Other fracture of lower end of left tibia, subsequent encounter for closed fracture with nonunion: Secondary | ICD-10-CM | POA: Diagnosis not present

## 2016-09-12 DIAGNOSIS — S82202A Unspecified fracture of shaft of left tibia, initial encounter for closed fracture: Secondary | ICD-10-CM | POA: Diagnosis not present

## 2016-10-05 DIAGNOSIS — M1611 Unilateral primary osteoarthritis, right hip: Secondary | ICD-10-CM | POA: Diagnosis not present

## 2016-10-10 DIAGNOSIS — M1611 Unilateral primary osteoarthritis, right hip: Secondary | ICD-10-CM | POA: Diagnosis not present

## 2016-10-10 DIAGNOSIS — Z4789 Encounter for other orthopedic aftercare: Secondary | ICD-10-CM | POA: Diagnosis not present

## 2016-10-10 DIAGNOSIS — S82832D Other fracture of upper and lower end of left fibula, subsequent encounter for closed fracture with routine healing: Secondary | ICD-10-CM | POA: Diagnosis not present

## 2016-10-10 DIAGNOSIS — S82392K Other fracture of lower end of left tibia, subsequent encounter for closed fracture with nonunion: Secondary | ICD-10-CM | POA: Diagnosis not present

## 2016-10-12 DIAGNOSIS — S82202A Unspecified fracture of shaft of left tibia, initial encounter for closed fracture: Secondary | ICD-10-CM | POA: Diagnosis not present

## 2016-10-26 DIAGNOSIS — S82832D Other fracture of upper and lower end of left fibula, subsequent encounter for closed fracture with routine healing: Secondary | ICD-10-CM | POA: Diagnosis not present

## 2016-10-26 DIAGNOSIS — S82392K Other fracture of lower end of left tibia, subsequent encounter for closed fracture with nonunion: Secondary | ICD-10-CM | POA: Diagnosis not present

## 2016-10-26 DIAGNOSIS — Z4789 Encounter for other orthopedic aftercare: Secondary | ICD-10-CM | POA: Diagnosis not present

## 2016-10-26 DIAGNOSIS — M5416 Radiculopathy, lumbar region: Secondary | ICD-10-CM | POA: Diagnosis not present

## 2016-11-02 DIAGNOSIS — M5416 Radiculopathy, lumbar region: Secondary | ICD-10-CM | POA: Diagnosis not present

## 2016-11-02 DIAGNOSIS — H01022 Squamous blepharitis right lower eyelid: Secondary | ICD-10-CM | POA: Diagnosis not present

## 2016-11-02 DIAGNOSIS — H11153 Pinguecula, bilateral: Secondary | ICD-10-CM | POA: Diagnosis not present

## 2016-11-02 DIAGNOSIS — H43393 Other vitreous opacities, bilateral: Secondary | ICD-10-CM | POA: Diagnosis not present

## 2016-11-02 DIAGNOSIS — H01021 Squamous blepharitis right upper eyelid: Secondary | ICD-10-CM | POA: Diagnosis not present

## 2016-11-10 DIAGNOSIS — M5416 Radiculopathy, lumbar region: Secondary | ICD-10-CM | POA: Diagnosis not present

## 2016-11-10 DIAGNOSIS — S82392K Other fracture of lower end of left tibia, subsequent encounter for closed fracture with nonunion: Secondary | ICD-10-CM | POA: Diagnosis not present

## 2016-11-12 DIAGNOSIS — S82202A Unspecified fracture of shaft of left tibia, initial encounter for closed fracture: Secondary | ICD-10-CM | POA: Diagnosis not present

## 2016-11-13 ENCOUNTER — Ambulatory Visit
Admission: RE | Admit: 2016-11-13 | Discharge: 2016-11-13 | Disposition: A | Discharge status: Home / Self Care | Payer: BLUE CROSS/BLUE SHIELD | Source: Ambulatory Visit | Attending: Orthopedic Surgery | Admitting: Orthopedic Surgery

## 2016-11-13 ENCOUNTER — Other Ambulatory Visit: Payer: Self-pay | Admitting: Orthopedic Surgery

## 2016-11-13 DIAGNOSIS — S82392K Other fracture of lower end of left tibia, subsequent encounter for closed fracture with nonunion: Secondary | ICD-10-CM

## 2016-11-13 DIAGNOSIS — M79605 Pain in left leg: Secondary | ICD-10-CM | POA: Diagnosis not present

## 2016-11-15 DIAGNOSIS — Z803 Family history of malignant neoplasm of breast: Secondary | ICD-10-CM | POA: Diagnosis not present

## 2016-11-15 DIAGNOSIS — Z1231 Encounter for screening mammogram for malignant neoplasm of breast: Secondary | ICD-10-CM | POA: Diagnosis not present

## 2016-11-16 DIAGNOSIS — L400 Psoriasis vulgaris: Secondary | ICD-10-CM | POA: Diagnosis not present

## 2016-11-17 DIAGNOSIS — M5416 Radiculopathy, lumbar region: Secondary | ICD-10-CM | POA: Diagnosis not present

## 2016-11-20 DIAGNOSIS — S82392K Other fracture of lower end of left tibia, subsequent encounter for closed fracture with nonunion: Secondary | ICD-10-CM | POA: Diagnosis not present

## 2016-11-30 DIAGNOSIS — Q6589 Other specified congenital deformities of hip: Secondary | ICD-10-CM | POA: Diagnosis not present

## 2016-11-30 DIAGNOSIS — G8929 Other chronic pain: Secondary | ICD-10-CM | POA: Diagnosis not present

## 2016-12-11 DIAGNOSIS — S82202K Unspecified fracture of shaft of left tibia, subsequent encounter for closed fracture with nonunion: Secondary | ICD-10-CM | POA: Diagnosis not present

## 2016-12-11 DIAGNOSIS — S82402K Unspecified fracture of shaft of left fibula, subsequent encounter for closed fracture with nonunion: Secondary | ICD-10-CM | POA: Diagnosis not present

## 2016-12-13 ENCOUNTER — Encounter (HOSPITAL_COMMUNITY): Payer: Self-pay | Admitting: *Deleted

## 2016-12-13 DIAGNOSIS — S82202A Unspecified fracture of shaft of left tibia, initial encounter for closed fracture: Secondary | ICD-10-CM | POA: Diagnosis not present

## 2016-12-13 NOTE — Progress Notes (Signed)
Pt denies SOB and chest pain. Pt under the care of Dr. Excell Seltzer at [facility]. Pt denies having a stress test, echo and cardiac cath. Pt denies having an EKG and chest x ray within the last year. Pt denies recent labs. Pt made aware to stop taking Aspirin, vitamins, fish oil and herbal medications. Do not take any NSAIDs ie: Ibuprofen, Advil, Naproxen (Aleve), Motrin, BC and Goody Powder or any medication containing Aspirin. Pt verbalized understanding of all pre-op instructions.

## 2016-12-14 ENCOUNTER — Inpatient Hospital Stay (HOSPITAL_COMMUNITY): Payer: BLUE CROSS/BLUE SHIELD

## 2016-12-14 ENCOUNTER — Encounter (HOSPITAL_COMMUNITY)
Admission: RE | Disposition: A | Discharge status: Home / Self Care | Payer: Self-pay | Source: Ambulatory Visit | Attending: Orthopedic Surgery

## 2016-12-14 ENCOUNTER — Inpatient Hospital Stay (HOSPITAL_COMMUNITY): Payer: BLUE CROSS/BLUE SHIELD | Admitting: Certified Registered"

## 2016-12-14 ENCOUNTER — Ambulatory Visit (HOSPITAL_COMMUNITY)
Admission: RE | Admit: 2016-12-14 | Discharge: 2016-12-16 | Disposition: A | Discharge status: Home / Self Care | Payer: BLUE CROSS/BLUE SHIELD | Source: Ambulatory Visit | Attending: Orthopedic Surgery | Admitting: Orthopedic Surgery

## 2016-12-14 ENCOUNTER — Ambulatory Visit (HOSPITAL_COMMUNITY): Payer: BLUE CROSS/BLUE SHIELD

## 2016-12-14 ENCOUNTER — Encounter (HOSPITAL_COMMUNITY): Payer: Self-pay | Admitting: Anesthesiology

## 2016-12-14 DIAGNOSIS — S82309K Unspecified fracture of lower end of unspecified tibia, subsequent encounter for closed fracture with nonunion: Secondary | ICD-10-CM | POA: Diagnosis present

## 2016-12-14 DIAGNOSIS — S82309A Unspecified fracture of lower end of unspecified tibia, initial encounter for closed fracture: Secondary | ICD-10-CM | POA: Diagnosis not present

## 2016-12-14 DIAGNOSIS — Z6828 Body mass index (BMI) 28.0-28.9, adult: Secondary | ICD-10-CM | POA: Diagnosis not present

## 2016-12-14 DIAGNOSIS — Q796 Ehlers-Danlos syndrome: Secondary | ICD-10-CM | POA: Diagnosis not present

## 2016-12-14 DIAGNOSIS — S82302K Unspecified fracture of lower end of left tibia, subsequent encounter for closed fracture with nonunion: Principal | ICD-10-CM | POA: Insufficient documentation

## 2016-12-14 DIAGNOSIS — Q7962 Hypermobile Ehlers-Danlos syndrome: Secondary | ICD-10-CM

## 2016-12-14 DIAGNOSIS — S82302A Unspecified fracture of lower end of left tibia, initial encounter for closed fracture: Secondary | ICD-10-CM | POA: Diagnosis not present

## 2016-12-14 DIAGNOSIS — S82201A Unspecified fracture of shaft of right tibia, initial encounter for closed fracture: Secondary | ICD-10-CM | POA: Diagnosis not present

## 2016-12-14 DIAGNOSIS — G522 Disorders of vagus nerve: Secondary | ICD-10-CM | POA: Insufficient documentation

## 2016-12-14 DIAGNOSIS — S82872A Displaced pilon fracture of left tibia, initial encounter for closed fracture: Secondary | ICD-10-CM | POA: Diagnosis not present

## 2016-12-14 DIAGNOSIS — D649 Anemia, unspecified: Secondary | ICD-10-CM | POA: Insufficient documentation

## 2016-12-14 DIAGNOSIS — S82232K Displaced oblique fracture of shaft of left tibia, subsequent encounter for closed fracture with nonunion: Secondary | ICD-10-CM | POA: Diagnosis not present

## 2016-12-14 DIAGNOSIS — S82492K Other fracture of shaft of left fibula, subsequent encounter for closed fracture with nonunion: Secondary | ICD-10-CM | POA: Diagnosis not present

## 2016-12-14 DIAGNOSIS — E559 Vitamin D deficiency, unspecified: Secondary | ICD-10-CM | POA: Diagnosis not present

## 2016-12-14 DIAGNOSIS — X58XXXA Exposure to other specified factors, initial encounter: Secondary | ICD-10-CM | POA: Diagnosis not present

## 2016-12-14 DIAGNOSIS — G43909 Migraine, unspecified, not intractable, without status migrainosus: Secondary | ICD-10-CM | POA: Diagnosis present

## 2016-12-14 DIAGNOSIS — S82432K Displaced oblique fracture of shaft of left fibula, subsequent encounter for closed fracture with nonunion: Secondary | ICD-10-CM | POA: Diagnosis not present

## 2016-12-14 DIAGNOSIS — G8929 Other chronic pain: Secondary | ICD-10-CM | POA: Insufficient documentation

## 2016-12-14 DIAGNOSIS — Z96642 Presence of left artificial hip joint: Secondary | ICD-10-CM | POA: Diagnosis not present

## 2016-12-14 DIAGNOSIS — M1990 Unspecified osteoarthritis, unspecified site: Secondary | ICD-10-CM | POA: Diagnosis present

## 2016-12-14 DIAGNOSIS — Z79899 Other long term (current) drug therapy: Secondary | ICD-10-CM | POA: Diagnosis not present

## 2016-12-14 DIAGNOSIS — Z419 Encounter for procedure for purposes other than remedying health state, unspecified: Secondary | ICD-10-CM

## 2016-12-14 DIAGNOSIS — Z01818 Encounter for other preprocedural examination: Secondary | ICD-10-CM

## 2016-12-14 DIAGNOSIS — Z881 Allergy status to other antibiotic agents status: Secondary | ICD-10-CM | POA: Insufficient documentation

## 2016-12-14 DIAGNOSIS — S82235K Nondisplaced oblique fracture of shaft of left tibia, subsequent encounter for closed fracture with nonunion: Secondary | ICD-10-CM

## 2016-12-14 DIAGNOSIS — E669 Obesity, unspecified: Secondary | ICD-10-CM | POA: Diagnosis not present

## 2016-12-14 DIAGNOSIS — G8918 Other acute postprocedural pain: Secondary | ICD-10-CM | POA: Diagnosis not present

## 2016-12-14 HISTORY — PX: TIBIA IM NAIL INSERTION: SHX2516

## 2016-12-14 HISTORY — DX: Unspecified fracture of shaft of left tibia, subsequent encounter for closed fracture with nonunion: S82.202K

## 2016-12-14 HISTORY — PX: TIBIA HARDWARE REMOVAL: SUR1133

## 2016-12-14 HISTORY — PX: IM NAILING TIBIA: SUR734

## 2016-12-14 HISTORY — PX: FIBULA FRACTURE SURGERY: SHX947

## 2016-12-14 HISTORY — PX: ORIF FIBULA FRACTURE: SHX5114

## 2016-12-14 HISTORY — PX: HARDWARE REMOVAL: SHX979

## 2016-12-14 HISTORY — DX: Vitamin D deficiency, unspecified: E55.9

## 2016-12-14 HISTORY — DX: Anemia, unspecified: D64.9

## 2016-12-14 LAB — CBC WITH DIFFERENTIAL/PLATELET
Basophils Absolute: 0 10*3/uL (ref 0.0–0.1)
Basophils Relative: 1 %
Eosinophils Absolute: 0 10*3/uL (ref 0.0–0.7)
Eosinophils Relative: 1 %
HCT: 34.5 % — ABNORMAL LOW (ref 36.0–46.0)
Hemoglobin: 10.9 g/dL — ABNORMAL LOW (ref 12.0–15.0)
Lymphocytes Relative: 17 %
Lymphs Abs: 1.1 10*3/uL (ref 0.7–4.0)
MCH: 23.9 pg — ABNORMAL LOW (ref 26.0–34.0)
MCHC: 31.6 g/dL (ref 30.0–36.0)
MCV: 75.7 fL — ABNORMAL LOW (ref 78.0–100.0)
Monocytes Absolute: 0.5 10*3/uL (ref 0.1–1.0)
Monocytes Relative: 8 %
Neutro Abs: 4.8 10*3/uL (ref 1.7–7.7)
Neutrophils Relative %: 73 %
Platelets: 401 10*3/uL — ABNORMAL HIGH (ref 150–400)
RBC: 4.56 MIL/uL (ref 3.87–5.11)
RDW: 15.5 % (ref 11.5–15.5)
WBC: 6.5 10*3/uL (ref 4.0–10.5)

## 2016-12-14 LAB — URINALYSIS, ROUTINE W REFLEX MICROSCOPIC
Bacteria, UA: NONE SEEN
Bilirubin Urine: NEGATIVE
Glucose, UA: NEGATIVE mg/dL
Ketones, ur: NEGATIVE mg/dL
Leukocytes, UA: NEGATIVE
Nitrite: NEGATIVE
Protein, ur: NEGATIVE mg/dL
Specific Gravity, Urine: 1.021 (ref 1.005–1.030)
pH: 5 (ref 5.0–8.0)

## 2016-12-14 LAB — RAPID URINE DRUG SCREEN, HOSP PERFORMED
Amphetamines: NOT DETECTED
Barbiturates: NOT DETECTED
Benzodiazepines: NOT DETECTED
Cocaine: NOT DETECTED
Opiates: POSITIVE — AB
Tetrahydrocannabinol: NOT DETECTED

## 2016-12-14 LAB — TYPE AND SCREEN
ABO/RH(D): O POS
Antibody Screen: NEGATIVE

## 2016-12-14 LAB — COMPREHENSIVE METABOLIC PANEL
ALT: 15 U/L (ref 14–54)
AST: 17 U/L (ref 15–41)
Albumin: 3.8 g/dL (ref 3.5–5.0)
Alkaline Phosphatase: 99 U/L (ref 38–126)
Anion gap: 9 (ref 5–15)
BUN: 9 mg/dL (ref 6–20)
CO2: 22 mmol/L (ref 22–32)
Calcium: 9 mg/dL (ref 8.9–10.3)
Chloride: 106 mmol/L (ref 101–111)
Creatinine, Ser: 0.87 mg/dL (ref 0.44–1.00)
GFR calc Af Amer: >60 mL/min (ref >60)
GFR calc non Af Amer: >60 mL/min (ref >60)
Glucose, Bld: 88 mg/dL (ref 65–99)
Potassium: 3.7 mmol/L (ref 3.5–5.1)
Sodium: 137 mmol/L (ref 135–145)
Total Bilirubin: 0.5 mg/dL (ref 0.3–1.2)
Total Protein: 7.2 g/dL (ref 6.5–8.1)

## 2016-12-14 LAB — TSH: TSH: 1.689 u[IU]/mL (ref 0.350–4.500)

## 2016-12-14 LAB — HCG, SERUM, QUALITATIVE: Preg, Serum: NEGATIVE

## 2016-12-14 LAB — PHOSPHORUS: Phosphorus: 3.2 mg/dL (ref 2.5–4.6)

## 2016-12-14 LAB — APTT: aPTT: 31 seconds (ref 24–36)

## 2016-12-14 LAB — C-REACTIVE PROTEIN: CRP: <0.8 mg/dL (ref <1.0)

## 2016-12-14 LAB — MAGNESIUM: Magnesium: 2 mg/dL (ref 1.7–2.4)

## 2016-12-14 LAB — ABO/RH: ABO/RH(D): O POS

## 2016-12-14 LAB — PREALBUMIN: Prealbumin: 21.3 mg/dL (ref 18–38)

## 2016-12-14 LAB — PROTIME-INR
INR: 1.1
Prothrombin Time: 14.1 seconds (ref 11.4–15.2)

## 2016-12-14 LAB — SEDIMENTATION RATE: Sed Rate: 30 mm/hr — ABNORMAL HIGH (ref 0–22)

## 2016-12-14 LAB — HEMOGLOBIN A1C
Hgb A1c MFr Bld: 5.2 % (ref 4.8–5.6)
Mean Plasma Glucose: 102.54 mg/dL

## 2016-12-14 SURGERY — REMOVAL, HARDWARE
Anesthesia: General | Site: Leg Lower | Laterality: Left

## 2016-12-14 MED ORDER — METHOCARBAMOL 500 MG PO TABS
ORAL_TABLET | ORAL | Status: AC
  Administered 2016-12-14: 500 mg
  Filled 2016-12-14: qty 1

## 2016-12-14 MED ORDER — SUGAMMADEX SODIUM 200 MG/2ML IV SOLN
INTRAVENOUS | Status: DC | PRN
Start: 2016-12-14 — End: 2016-12-14
  Administered 2016-12-14: 181.4 mg via INTRAVENOUS

## 2016-12-14 MED ORDER — MIDAZOLAM HCL 2 MG/2ML IJ SOLN
INTRAMUSCULAR | Status: AC
  Filled 2016-12-14: qty 2

## 2016-12-14 MED ORDER — LIDOCAINE 2% (20 MG/ML) 5 ML SYRINGE
INTRAMUSCULAR | Status: AC
  Filled 2016-12-14: qty 5

## 2016-12-14 MED ORDER — SERTRALINE HCL 50 MG PO TABS
50.0000 mg | ORAL_TABLET | Freq: Every day | ORAL | Status: DC
  Administered 2016-12-14 – 2016-12-15 (×2): 50 mg via ORAL
  Filled 2016-12-14 (×2): qty 1

## 2016-12-14 MED ORDER — HYDROMORPHONE HCL 1 MG/ML IJ SOLN
1.0000 mg | INTRAMUSCULAR | Status: DC | PRN
  Administered 2016-12-14 (×2): 2 mg via INTRAVENOUS
  Administered 2016-12-14: 1 mg via INTRAVENOUS
  Administered 2016-12-15 (×3): 2 mg via INTRAVENOUS
  Administered 2016-12-15: 1 mg via INTRAVENOUS
  Administered 2016-12-15: 2 mg via INTRAVENOUS
  Filled 2016-12-14: qty 2
  Filled 2016-12-14: qty 1
  Filled 2016-12-14: qty 2
  Filled 2016-12-14: qty 1
  Filled 2016-12-14 (×3): qty 2
  Filled 2016-12-14: qty 1

## 2016-12-14 MED ORDER — MECLIZINE HCL 25 MG PO TABS: 25.0000 mg | ORAL_TABLET | Freq: Three times a day (TID) | ORAL | Status: DC | PRN

## 2016-12-14 MED ORDER — ACETAMINOPHEN 10 MG/ML IV SOLN: 1000.0000 mg | Freq: Once | INTRAVENOUS | Status: DC

## 2016-12-14 MED ORDER — METOCLOPRAMIDE HCL 5 MG/ML IJ SOLN: 5.0000 mg | Freq: Three times a day (TID) | INTRAMUSCULAR | Status: DC | PRN

## 2016-12-14 MED ORDER — MIDAZOLAM HCL 2 MG/2ML IJ SOLN
2.0000 mg | Freq: Once | INTRAMUSCULAR | Status: AC
  Administered 2016-12-14: 2 mg via INTRAVENOUS
  Filled 2016-12-14: qty 2

## 2016-12-14 MED ORDER — ONDANSETRON HCL 4 MG/2ML IJ SOLN
INTRAMUSCULAR | Status: AC
  Filled 2016-12-14: qty 2

## 2016-12-14 MED ORDER — GLYCOPYRROLATE 0.2 MG/ML IJ SOLN
INTRAMUSCULAR | Status: DC | PRN
  Administered 2016-12-14: .2 mg via INTRAVENOUS

## 2016-12-14 MED ORDER — OXYCODONE HCL 5 MG PO TABS
ORAL_TABLET | ORAL | Status: AC
  Administered 2016-12-14: 17:00:00
  Filled 2016-12-14: qty 2

## 2016-12-14 MED ORDER — CHLORHEXIDINE GLUCONATE 4 % EX LIQD: 60.0000 mL | Freq: Once | CUTANEOUS | Status: DC

## 2016-12-14 MED ORDER — METHOCARBAMOL 1000 MG/10ML IJ SOLN
1000.0000 mg | Freq: Four times a day (QID) | INTRAVENOUS | Status: DC | PRN
  Filled 2016-12-14: qty 10

## 2016-12-14 MED ORDER — METHOCARBAMOL 500 MG PO TABS
500.0000 mg | ORAL_TABLET | Freq: Four times a day (QID) | ORAL | Status: DC | PRN
  Administered 2016-12-14: 500 mg via ORAL
  Administered 2016-12-15 – 2016-12-16 (×3): 1000 mg via ORAL
  Filled 2016-12-14 (×3): qty 2
  Filled 2016-12-14: qty 1

## 2016-12-14 MED ORDER — CLINDAMYCIN PHOSPHATE 900 MG/50ML IV SOLN
900.0000 mg | INTRAVENOUS | Status: AC
  Administered 2016-12-14: 900 mg via INTRAVENOUS
  Filled 2016-12-14: qty 50

## 2016-12-14 MED ORDER — ROPIVACAINE HCL 7.5 MG/ML IJ SOLN
INTRAMUSCULAR | Status: DC | PRN
  Administered 2016-12-14: 20 mL via PERINEURAL

## 2016-12-14 MED ORDER — PROMETHAZINE HCL 25 MG RE SUPP: 25.0000 mg | Freq: Four times a day (QID) | RECTAL | Status: DC | PRN

## 2016-12-14 MED ORDER — ENOXAPARIN SODIUM 40 MG/0.4ML ~~LOC~~ SOLN
40.0000 mg | SUBCUTANEOUS | Status: DC
  Administered 2016-12-15 – 2016-12-16 (×2): 40 mg via SUBCUTANEOUS
  Filled 2016-12-14 (×2): qty 0.4

## 2016-12-14 MED ORDER — PHENYLEPHRINE 40 MCG/ML (10ML) SYRINGE FOR IV PUSH (FOR BLOOD PRESSURE SUPPORT)
PREFILLED_SYRINGE | INTRAVENOUS | Status: DC | PRN
  Administered 2016-12-14: 80 ug via INTRAVENOUS
  Administered 2016-12-14 (×2): 40 ug via INTRAVENOUS
  Administered 2016-12-14: 80 ug via INTRAVENOUS

## 2016-12-14 MED ORDER — ONDANSETRON HCL 4 MG/2ML IJ SOLN: 4.0000 mg | Freq: Four times a day (QID) | INTRAMUSCULAR | Status: DC | PRN

## 2016-12-14 MED ORDER — ONDANSETRON HCL 4 MG/2ML IJ SOLN
INTRAMUSCULAR | Status: DC | PRN
  Administered 2016-12-14: 4 mg via INTRAVENOUS

## 2016-12-14 MED ORDER — DOCUSATE SODIUM 100 MG PO CAPS
100.0000 mg | ORAL_CAPSULE | Freq: Two times a day (BID) | ORAL | Status: DC
  Administered 2016-12-14 – 2016-12-16 (×4): 100 mg via ORAL
  Filled 2016-12-14 (×4): qty 1

## 2016-12-14 MED ORDER — FENTANYL CITRATE (PF) 250 MCG/5ML IJ SOLN
INTRAMUSCULAR | Status: DC | PRN
  Administered 2016-12-14: 50 ug via INTRAVENOUS

## 2016-12-14 MED ORDER — METOPROLOL SUCCINATE ER 50 MG PO TB24
50.0000 mg | ORAL_TABLET | Freq: Every day | ORAL | Status: DC
  Administered 2016-12-15: 50 mg via ORAL
  Filled 2016-12-14: qty 1

## 2016-12-14 MED ORDER — ONDANSETRON HCL 4 MG PO TABS: 4.0000 mg | ORAL_TABLET | Freq: Four times a day (QID) | ORAL | Status: DC | PRN

## 2016-12-14 MED ORDER — ROCURONIUM BROMIDE 10 MG/ML (PF) SYRINGE
PREFILLED_SYRINGE | INTRAVENOUS | Status: DC | PRN
Start: 2016-12-14 — End: 2016-12-14
  Administered 2016-12-14: 10 mg via INTRAVENOUS
  Administered 2016-12-14 (×2): 20 mg via INTRAVENOUS
  Administered 2016-12-14: 50 mg via INTRAVENOUS

## 2016-12-14 MED ORDER — FENTANYL CITRATE (PF) 250 MCG/5ML IJ SOLN
INTRAMUSCULAR | Status: AC
  Filled 2016-12-14: qty 5

## 2016-12-14 MED ORDER — ROCURONIUM BROMIDE 10 MG/ML (PF) SYRINGE
PREFILLED_SYRINGE | INTRAVENOUS | Status: AC
  Filled 2016-12-14: qty 5

## 2016-12-14 MED ORDER — HYDROMORPHONE HCL 1 MG/ML IJ SOLN
INTRAMUSCULAR | Status: AC
  Administered 2016-12-14: 17:00:00
  Filled 2016-12-14: qty 1

## 2016-12-14 MED ORDER — OXYCODONE HCL 5 MG PO TABS
5.0000 mg | ORAL_TABLET | ORAL | Status: DC | PRN
  Administered 2016-12-14 – 2016-12-16 (×8): 10 mg via ORAL
  Filled 2016-12-14 (×7): qty 2

## 2016-12-14 MED ORDER — SUGAMMADEX SODIUM 200 MG/2ML IV SOLN
INTRAVENOUS | Status: AC
  Filled 2016-12-14: qty 2

## 2016-12-14 MED ORDER — 0.9 % SODIUM CHLORIDE (POUR BTL) OPTIME
TOPICAL | Status: DC | PRN
  Administered 2016-12-14: 1000 mL

## 2016-12-14 MED ORDER — PROPOFOL 10 MG/ML IV BOLUS
INTRAVENOUS | Status: AC
  Filled 2016-12-14: qty 20

## 2016-12-14 MED ORDER — ACETAMINOPHEN 325 MG PO TABS: 650.0000 mg | ORAL_TABLET | Freq: Four times a day (QID) | ORAL | Status: DC | PRN

## 2016-12-14 MED ORDER — POLYETHYLENE GLYCOL 3350 17 G PO PACK
17.0000 g | PACK | Freq: Every day | ORAL | Status: DC
  Administered 2016-12-14 – 2016-12-16 (×3): 17 g via ORAL
  Filled 2016-12-14 (×3): qty 1

## 2016-12-14 MED ORDER — CLINDAMYCIN PHOSPHATE 600 MG/50ML IV SOLN
600.0000 mg | Freq: Four times a day (QID) | INTRAVENOUS | Status: AC
  Administered 2016-12-14 – 2016-12-15 (×3): 600 mg via INTRAVENOUS
  Filled 2016-12-14 (×5): qty 50

## 2016-12-14 MED ORDER — HYDROMORPHONE HCL 1 MG/ML IJ SOLN
INTRAMUSCULAR | Status: AC
  Administered 2016-12-14: 2 mg via INTRAVENOUS
  Filled 2016-12-14: qty 1

## 2016-12-14 MED ORDER — SUMATRIPTAN SUCCINATE 6 MG/0.5ML ~~LOC~~ SOLN: 6.0000 mg | SUBCUTANEOUS | Status: DC | PRN

## 2016-12-14 MED ORDER — DEXAMETHASONE SODIUM PHOSPHATE 10 MG/ML IJ SOLN
INTRAMUSCULAR | Status: DC | PRN
Start: 2016-12-14 — End: 2016-12-14
  Administered 2016-12-14: 5 mg via INTRAVENOUS

## 2016-12-14 MED ORDER — DEXAMETHASONE SODIUM PHOSPHATE 10 MG/ML IJ SOLN
INTRAMUSCULAR | Status: AC
  Filled 2016-12-14: qty 1

## 2016-12-14 MED ORDER — PROPOFOL 10 MG/ML IV BOLUS
INTRAVENOUS | Status: DC | PRN
  Administered 2016-12-14: 150 mg via INTRAVENOUS

## 2016-12-14 MED ORDER — POTASSIUM CHLORIDE IN NACL 20-0.9 MEQ/L-% IV SOLN
INTRAVENOUS | Status: DC
  Administered 2016-12-14: 19:00:00 via INTRAVENOUS
  Filled 2016-12-14 (×2): qty 1000

## 2016-12-14 MED ORDER — OXYCODONE-ACETAMINOPHEN 5-325 MG PO TABS
1.0000 | ORAL_TABLET | Freq: Four times a day (QID) | ORAL | Status: DC | PRN
  Administered 2016-12-14 – 2016-12-16 (×2): 1 via ORAL
  Filled 2016-12-14 (×2): qty 1

## 2016-12-14 MED ORDER — MIDAZOLAM HCL 5 MG/5ML IJ SOLN
INTRAMUSCULAR | Status: DC | PRN
  Administered 2016-12-14: 2 mg via INTRAVENOUS

## 2016-12-14 MED ORDER — METOCLOPRAMIDE HCL 5 MG PO TABS: 5.0000 mg | ORAL_TABLET | Freq: Three times a day (TID) | ORAL | Status: DC | PRN

## 2016-12-14 MED ORDER — ACETAMINOPHEN 650 MG RE SUPP: 650.0000 mg | Freq: Four times a day (QID) | RECTAL | Status: DC | PRN

## 2016-12-14 MED ORDER — LIDOCAINE 2% (20 MG/ML) 5 ML SYRINGE
INTRAMUSCULAR | Status: DC | PRN
Start: 2016-12-14 — End: 2016-12-14
  Administered 2016-12-14: 80 mg via INTRAVENOUS

## 2016-12-14 MED ORDER — ROPIVACAINE HCL 5 MG/ML IJ SOLN
INTRAMUSCULAR | Status: DC | PRN
  Administered 2016-12-14: 15 mL via PERINEURAL

## 2016-12-14 MED ORDER — LACTATED RINGERS IV SOLN
INTRAVENOUS | Status: DC | PRN
  Administered 2016-12-14 (×2): via INTRAVENOUS

## 2016-12-14 MED ORDER — FENTANYL CITRATE (PF) 100 MCG/2ML IJ SOLN
100.0000 ug | Freq: Once | INTRAMUSCULAR | Status: AC
  Administered 2016-12-14: 100 ug via INTRAVENOUS
  Filled 2016-12-14: qty 2

## 2016-12-14 MED ORDER — PHENYLEPHRINE HCL 10 MG/ML IJ SOLN
INTRAVENOUS | Status: DC | PRN
  Administered 2016-12-14: 25 ug/min via INTRAVENOUS

## 2016-12-14 SURGICAL SUPPLY — 94 items
BANDAGE ACE 4X5 VEL STRL LF (GAUZE/BANDAGES/DRESSINGS) ×2 IMPLANT
BANDAGE ACE 6X5 VEL STRL LF (GAUZE/BANDAGES/DRESSINGS) ×2 IMPLANT
BANDAGE ESMARK 6X9 LF (GAUZE/BANDAGES/DRESSINGS) IMPLANT
BIT DRILL 4.9 CANNULATED (BIT) ×1
BIT DRILL AO GAMMA 4.2X130 (BIT) ×2 IMPLANT
BIT DRILL AO GAMMA 4.2X180 (BIT) ×2 IMPLANT
BIT DRILL AO GAMMA 4.2X340 (BIT) ×2 IMPLANT
BIT DRILL CANN QC 4.9 LRG (BIT) ×1 IMPLANT
BLADE SURG 10 STRL SS (BLADE) ×4 IMPLANT
BNDG COHESIVE 6X5 TAN STRL LF (GAUZE/BANDAGES/DRESSINGS) IMPLANT
BNDG ESMARK 6X9 LF (GAUZE/BANDAGES/DRESSINGS)
BNDG GAUZE ELAST 4 BULKY (GAUZE/BANDAGES/DRESSINGS) ×2 IMPLANT
BRUSH SCRUB SURG 4.25 DISP (MISCELLANEOUS) ×4 IMPLANT
CLEANER TIP ELECTROSURG 2X2 (MISCELLANEOUS) ×2 IMPLANT
COVER MAYO STAND STRL (DRAPES) IMPLANT
COVER SURGICAL LIGHT HANDLE (MISCELLANEOUS) ×4 IMPLANT
CUFF TOURNIQUET SINGLE 18IN (TOURNIQUET CUFF) IMPLANT
CUFF TOURNIQUET SINGLE 24IN (TOURNIQUET CUFF) IMPLANT
CUFF TOURNIQUET SINGLE 34IN LL (TOURNIQUET CUFF) IMPLANT
DECANTER SPIKE VIAL GLASS SM (MISCELLANEOUS) IMPLANT
DRAPE C-ARM 42X72 X-RAY (DRAPES) ×4 IMPLANT
DRAPE C-ARMOR (DRAPES) ×2 IMPLANT
DRAPE HALF SHEET 40X57 (DRAPES) ×4 IMPLANT
DRAPE INCISE IOBAN 66X45 STRL (DRAPES) IMPLANT
DRAPE OEC MINIVIEW 54X84 (DRAPES) ×2 IMPLANT
DRAPE ORTHO SPLIT 77X108 STRL (DRAPES) ×1
DRAPE SURG ORHT 6 SPLT 77X108 (DRAPES) ×1 IMPLANT
DRAPE U-SHAPE 47X51 STRL (DRAPES) ×2 IMPLANT
DRILL 2.6X122MM WL AO SHAFT (BIT) ×2 IMPLANT
DRILL BIT CANNULATED 4.9 (BIT) ×1
DRSG ADAPTIC 3X8 NADH LF (GAUZE/BANDAGES/DRESSINGS) ×2 IMPLANT
DRSG MEPITEL 4X7.2 (GAUZE/BANDAGES/DRESSINGS) ×2 IMPLANT
ELECT REM PT RETURN 9FT ADLT (ELECTROSURGICAL) ×2
ELECTRODE REM PT RTRN 9FT ADLT (ELECTROSURGICAL) ×1 IMPLANT
EVACUATOR 1/8 PVC DRAIN (DRAIN) IMPLANT
GAUZE SPONGE 4X4 12PLY STRL (GAUZE/BANDAGES/DRESSINGS) ×2 IMPLANT
GLOVE BIO SURGEON STRL SZ7.5 (GLOVE) ×2 IMPLANT
GLOVE BIO SURGEON STRL SZ8 (GLOVE) ×2 IMPLANT
GLOVE BIO SURGEON STRL SZ8.5 (GLOVE) ×2 IMPLANT
GLOVE BIOGEL PI IND STRL 7.5 (GLOVE) ×1 IMPLANT
GLOVE BIOGEL PI IND STRL 8 (GLOVE) ×1 IMPLANT
GLOVE BIOGEL PI INDICATOR 7.5 (GLOVE) ×1
GLOVE BIOGEL PI INDICATOR 8 (GLOVE) ×1
GOWN STRL REUS W/ TWL LRG LVL3 (GOWN DISPOSABLE) ×2 IMPLANT
GOWN STRL REUS W/ TWL XL LVL3 (GOWN DISPOSABLE) ×1 IMPLANT
GOWN STRL REUS W/TWL 2XL LVL3 (GOWN DISPOSABLE) ×2 IMPLANT
GOWN STRL REUS W/TWL LRG LVL3 (GOWN DISPOSABLE) ×2
GOWN STRL REUS W/TWL XL LVL3 (GOWN DISPOSABLE) ×1
GUIDEWIRE ASNIS 3.2 NONCAL (WIRE) ×2 IMPLANT
GUIDEWIRE GAMMA 800 (WIRE) ×2 IMPLANT
GUIDEWIRE GAMMA SM TIP 3X800MM (WIRE) ×2 IMPLANT
KIT BASIN OR (CUSTOM PROCEDURE TRAY) ×2 IMPLANT
KIT INFUSE SMALL (Orthopedic Implant) ×2 IMPLANT
KIT ROOM TURNOVER OR (KITS) ×2 IMPLANT
MANIFOLD NEPTUNE II (INSTRUMENTS) ×2 IMPLANT
NAIL TIBIAL STANDARD 11X375 (Nail) ×2 IMPLANT
NAIL TIBIAL STD 11X375 (Nail) ×1 IMPLANT
NEEDLE 22X1 1/2 (OR ONLY) (NEEDLE) IMPLANT
NS IRRIG 1000ML POUR BTL (IV SOLUTION) ×2 IMPLANT
PACK ORTHO EXTREMITY (CUSTOM PROCEDURE TRAY) ×2 IMPLANT
PAD ABD 8X10 STRL (GAUZE/BANDAGES/DRESSINGS) ×2 IMPLANT
PAD ARMBOARD 7.5X6 YLW CONV (MISCELLANEOUS) ×4 IMPLANT
PAD CAST 4YDX4 CTTN HI CHSV (CAST SUPPLIES) ×1 IMPLANT
PADDING CAST COTTON 4X4 STRL (CAST SUPPLIES) ×1
PADDING CAST COTTON 6X4 STRL (CAST SUPPLIES) ×2 IMPLANT
REAMER SHAFT BIXCUT (INSTRUMENTS) ×2 IMPLANT
SCREW CANN 6.5X80 STRL (Screw) ×2 IMPLANT
SCREW GAMMA (Screw) ×2 IMPLANT
SCREW LOCKING T2 F/T  5MMX40MM (Screw) ×2 IMPLANT
SCREW LOCKING T2 F/T  5X37.5MM (Screw) ×1 IMPLANT
SCREW LOCKING T2 F/T 5MMX40MM (Screw) ×2 IMPLANT
SCREW LOCKING T2 F/T 5X37.5MM (Screw) ×1 IMPLANT
SCREW LOCKING THREADED 5X47.5 (Screw) ×4 IMPLANT
SPONGE LAP 18X18 X RAY DECT (DISPOSABLE) ×2 IMPLANT
STAPLER VISISTAT 35W (STAPLE) ×2 IMPLANT
STOCKINETTE IMPERVIOUS LG (DRAPES) ×2 IMPLANT
STRIP CLOSURE SKIN 1/2X4 (GAUZE/BANDAGES/DRESSINGS) IMPLANT
SUCTION FRAZIER HANDLE 10FR (MISCELLANEOUS)
SUCTION TUBE FRAZIER 10FR DISP (MISCELLANEOUS) IMPLANT
SUT ETHILON 3 0 PS 1 (SUTURE) ×2 IMPLANT
SUT PDS AB 2-0 CT1 27 (SUTURE) IMPLANT
SUT PROLENE 0 CT 1 30 (SUTURE) ×4 IMPLANT
SUT VIC AB 0 CT1 27 (SUTURE) ×2
SUT VIC AB 0 CT1 27XBRD ANBCTR (SUTURE) ×2 IMPLANT
SUT VIC AB 2-0 CT1 27 (SUTURE) ×2
SUT VIC AB 2-0 CT1 TAPERPNT 27 (SUTURE) ×2 IMPLANT
SYR CONTROL 10ML LL (SYRINGE) IMPLANT
TOWEL OR 17X24 6PK STRL BLUE (TOWEL DISPOSABLE) ×4 IMPLANT
TOWEL OR 17X26 10 PK STRL BLUE (TOWEL DISPOSABLE) ×4 IMPLANT
TRAY FOLEY W/METER SILVER 16FR (SET/KITS/TRAYS/PACK) IMPLANT
TUBE CONNECTING 12X1/4 (SUCTIONS) ×2 IMPLANT
UNDERPAD 30X30 (UNDERPADS AND DIAPERS) ×2 IMPLANT
WATER STERILE IRR 1000ML POUR (IV SOLUTION) ×4 IMPLANT
YANKAUER SUCT BULB TIP NO VENT (SUCTIONS) ×2 IMPLANT

## 2016-12-14 NOTE — Anesthesia Procedure Notes (Signed)
Anesthesia Regional Block: Popliteal block   Pre-Anesthetic Checklist: ,, timeout performed, Correct Patient, Correct Site, Correct Laterality, Correct Procedure, Correct Position, site marked, Risks and benefits discussed,  Surgical consent,  Pre-op evaluation,  At surgeon's request and post-op pain management  Laterality: Left  Prep: chloraprep       Needles:  Injection technique: Single-shot  Needle Type: Echogenic Stimulator Needle     Needle Length: 9cm  Needle Gauge: 21     Additional Needles:   Procedures:,,,, ultrasound used (permanent image in chart),,,,  Narrative:  Start time: 12/14/2016 11:15 AM End time: 12/14/2016 11:20 AM Injection made incrementally with aspirations every 5 mL.  Performed by: Personally  Anesthesiologist: Mal Amabile  Additional Notes: Timeout performed. Patient sedated. Relevant anatomy ID'd using [place]. Incremental 2-40ml injection of LA with frequent aspiration. Patient tolerated procedure well.

## 2016-12-14 NOTE — Transfer of Care (Signed)
Immediate Anesthesia Transfer of Care Note  Patient: Angela Harrison  Procedure(s) Performed: Procedure(s): HARDWARE REMOVAL LEFT TIBIA (Left) INTRAMEDULLARY (IM) NAIL LEFT TIBIAL (Left) NON-UNION REPAIR LEFT FIBULA FRACTURE (Left)  Patient Location: PACU  Anesthesia Type:General  Level of Consciousness: awake, alert  and oriented  Airway & Oxygen Therapy: Patient Spontanous Breathing and Patient connected to nasal cannula oxygen  Post-op Assessment: Report given to RN and Post -op Vital signs reviewed and stable  Post vital signs: Reviewed and stable  Last Vitals:  Vitals:   12/14/16 0925 12/14/16 1604  BP: 117/86 110/64  Pulse: 73 67  Resp: 20 14  Temp: 36.8 C 36.6 C  SpO2: 100% 100%    Last Pain:  Vitals:   12/14/16 0953  TempSrc:   PainSc: 6       Patients Stated Pain Goal: 4 (12/14/16 0953)  Complications: No apparent anesthesia complications

## 2016-12-14 NOTE — Anesthesia Preprocedure Evaluation (Addendum)
Anesthesia Evaluation  Patient identified by MRN, date of birth, ID band Patient awake    Reviewed: Allergy & Precautions, NPO status , Patient's Chart, lab work & pertinent test results, reviewed documented beta blocker date and time   Airway Mallampati: II  TM Distance: >3 FB Neck ROM: Full    Dental no notable dental hx. (+) Teeth Intact   Pulmonary shortness of breath and with exertion,    Pulmonary exam normal breath sounds clear to auscultation       Cardiovascular negative cardio ROS Normal cardiovascular exam Rhythm:Regular Rate:Normal     Neuro/Psych  Headaches, PSYCHIATRIC DISORDERS    GI/Hepatic negative GI ROS, Neg liver ROS,   Endo/Other  Obesity  Renal/GU negative Renal ROS  negative genitourinary   Musculoskeletal  (+) Arthritis , Osteoarthritis,  Non union left fibula fracture Ehrler's Danlos syndrome type III   Abdominal (+) + obese,   Peds  Hematology  (+) anemia ,   Anesthesia Other Findings   Reproductive/Obstetrics                             Anesthesia Physical Anesthesia Plan  ASA: II  Anesthesia Plan: General   Post-op Pain Management:  Regional for Post-op pain   Induction: Intravenous  PONV Risk Score and Plan: 3 and Ondansetron, Dexamethasone, Midazolam and Propofol infusion  Airway Management Planned: LMA  Additional Equipment:   Intra-op Plan:   Post-operative Plan: Extubation in OR  Informed Consent: I have reviewed the patients History and Physical, chart, labs and discussed the procedure including the risks, benefits and alternatives for the proposed anesthesia with the patient or authorized representative who has indicated his/her understanding and acceptance.   Dental advisory given  Plan Discussed with: CRNA, Anesthesiologist and Surgeon  Anesthesia Plan Comments:        Anesthesia Quick Evaluation

## 2016-12-14 NOTE — Progress Notes (Signed)
Orthopedic Tech Progress Note Patient Details:  Angela Harrison 02-Aug-1975 [id number]  Ortho Devices Type of Ortho Device: Postop shoe/boot Ortho Device/Splint Location: Dropped off prafo boot at nurse station as request by OR nurse/staff.     Alvina Chou 12/14/2016, 3:39 PM

## 2016-12-14 NOTE — H&P (Signed)
Orthopaedic Trauma Service H&P/Consult     Patient ID: Angela Harrison MRN: [id number] DOB/AGE: [birth year] 41 y.o.  Chief Complaint: right tibia and fibula  HPI: Angela Harrison is an 41 y.o. female s/p low energy fracture s/p IM nailing and ORIF with nonunions of both. Has avoided smoking, NSAIDs, and has used bone stimulator for six months without success. Continued bone pain and crutch dependence. Confirmed by CT.   Past Medical History:  Diagnosis Date  . Anemia    PMH  . Arthritis    oa  . 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 Ehlers-Danlos syndrome type III   . Gastric paresis   . Migraine    migraines  . Syncope    neurocardiogenic takes toprol and zoloft for    Past Surgical History:  Procedure Laterality Date  . CHOLECYSTECTOMY    . ESOPHAGOGASTRODUODENOSCOPY ENDOSCOPY     x 2 or 3  . KNEE SURGERY Bilateral    2 on each knee  . MAXILLARY ANTROSTOMY    . ORIF ANKLE FRACTURE Left 04/12/2016   Procedure: OPEN REDUCTION INTERNAL FIXATION (ORIF) ANKLE LEFT;  Surgeon: Nicholes Stairs, MD;  Location: [facility];  Service: Orthopedics;  Laterality: Left;  . SHOULDER SURGERY Left    x 4  . TIBIA IM NAIL INSERTION Left 04/12/2016   Procedure: INTRAMEDULLARY (IM) NAIL TIBIAL LEFT;  Surgeon: Nicholes Stairs, MD;  Location: [facility];  Service: Orthopedics;  Laterality: Left;  . TONSILLECTOMY     adenoids also  . TOTAL HIP ARTHROPLASTY Left 08/24/2015   Procedure: LEFT TOTAL HIP ARTHROPLASTY ANTERIOR APPROACH;  Surgeon: Paralee Cancel, MD;  Location: [facility];  Service: Orthopedics;  Laterality: Left;    Family History  Problem Relation Age of Onset  . Breast cancer Mother   . Migraines Mother   . Hypertension Father   . Migraines Father   . Migraines Brother   . Migraines Other    Social History:  reports that she has never smoked. She has never used smokeless tobacco. She reports that she drinks alcohol. She reports that she does  not use drugs.  Allergies:  Allergies  Allergen Reactions  . Cephalosporins Hives, Swelling, Rash and Other (See Comments)    Can tolerate Penicillin  . Cefazolin Hives, Swelling, Rash and Other (See Comments)    Can tolerate Penicillin   . Cephalexin Hives, Swelling, Rash and Other (See Comments)    Can tolerate Penicillin     Medications Prior to Admission  Medication Sig Dispense Refill  . acetaminophen (TYLENOL) 500 MG tablet Take 500-1,000 mg by mouth every 6 (six) hours as needed for mild pain (DEPENDS ON PAIN AND HOW MUCH HYDROCODONE SHE HAS HAD).    . fluocinonide (LIDEX) 0.05 % external solution APPLY TO AFFECTED AREAS AS NEEDED FOR PSORIASIS  6  . HYDROcodone-acetaminophen (NORCO/VICODIN) 5-325 MG tablet Take 1 tablet by mouth every 8 (eight) hours as needed for moderate pain. for pain  0  . ketoconazole (NIZORAL) 2 % shampoo APPLY ON SKIN AS DIRECTED,LATHER AND LET SIT FOR FEW MINUTES PRIOR TO RINSING WHEN YOU SHOWER  6  . meclizine (ANTIVERT) 25 MG tablet Take 25 mg by mouth 3 (three) times daily as needed for dizziness.    . methocarbamol (ROBAXIN) 750 MG tablet TAKE 1 TABLET BY MOUTH TWICE A DAY AS NEEDED FOR SPASM  0  . metoprolol succinate (TOPROL-XL) 50 MG 24 hr tablet TAKE 1  TABLET (50 MG TOTAL) BY MOUTH EVERY EVENING.  11  . sertraline (ZOLOFT) 50 MG tablet TAKE 1 TABLET (50 MG TOTAL) BY MOUTH IN THE EVENING  11  . cyclobenzaprine (FLEXERIL) 10 MG tablet Take 1 tablet (10 mg total) by mouth 3 (three) times daily as needed for muscle spasms. (Patient not taking: Reported on 04/11/2016) 30 tablet 0  . docusate sodium (COLACE) 100 MG capsule Take 1 capsule (100 mg total) by mouth 2 (two) times daily. (Patient not taking: Reported on 04/11/2016) 10 capsule 0  . ferrous sulfate 325 (65 FE) MG tablet Take 1 tablet (325 mg total) by mouth 3 (three) times daily after meals. (Patient not taking: Reported on 04/11/2016)  3  . HYDROcodone-acetaminophen (NORCO) 7.5-325 MG tablet Take  1-2 tablets by mouth every 4 (four) hours as needed for moderate pain. (Patient not taking: Reported on 04/11/2016) 100 tablet 0  . methocarbamol (ROBAXIN) 500 MG tablet Take 1 tablet (500 mg total) by mouth every 6 (six) hours as needed for muscle spasms. (Patient not taking: Reported on 12/13/2016) 60 tablet 0  . metoCLOPramide (REGLAN) 10 MG tablet Take 1 tablet (10 mg total) by mouth every 6 (six) hours as needed for nausea or vomiting. (Patient not taking: Reported on 04/11/2016) 30 tablet 0  . ondansetron (ZOFRAN) 8 MG tablet Take 8 mg by mouth every 8 (eight) hours as needed for nausea or vomiting.     Marland Kitchen oxyCODONE (OXY IR/ROXICODONE) 5 MG immediate release tablet Take 1-3 tablets (5-15 mg total) by mouth every 4 (four) hours as needed for breakthrough pain. (Patient not taking: Reported on 12/13/2016) 90 tablet 0  . polyethylene glycol (MIRALAX / GLYCOLAX) packet Take 17 g by mouth 2 (two) times daily. (Patient not taking: Reported on 04/11/2016) 14 each 0  . promethazine (PHENERGAN) 25 MG suppository Place 25 mg rectally every 6 (six) hours as needed for nausea or vomiting.     . SUMAtriptan (IMITREX) 6 MG/0.5ML SOLN injection Inject 6 mg into the skin every 2 (two) hours as needed for migraine or headache. May repeat in 2 hours if headache persists or recurs.      Results for orders placed or performed during the hospital encounter of 12/14/16 (from the past 48 hour(s))  Type and screen Order type and screen if day of surgery is less than 15 days from draw of preadmission visit or order morning of surgery if day of surgery is greater than 6 days from preadmission visit.     Status: None   Collection Time: 12/14/16  9:40 AM  Result Value Ref Range   ABO/RH(D) O POS    Antibody Screen NEG    Sample Expiration 12/17/2016   ABO/Rh     Status: None   Collection Time: 12/14/16  9:40 AM  Result Value Ref Range   ABO/RH(D) O POS   TSH     Status: None   Collection Time: 12/14/16  9:43 AM  Result  Value Ref Range   TSH 1.689 0.350 - 4.500 uIU/mL    Comment: Performed by a 3rd Generation assay with a functional sensitivity of <=0.01 uIU/mL.  Magnesium     Status: None   Collection Time: 12/14/16  9:43 AM  Result Value Ref Range   Magnesium 2.0 1.7 - 2.4 mg/dL  Phosphorus     Status: None   Collection Time: 12/14/16  9:43 AM  Result Value Ref Range   Phosphorus 3.2 2.5 - 4.6 mg/dL  Prealbumin  Status: None   Collection Time: 12/14/16  9:43 AM  Result Value Ref Range   Prealbumin 21.3 18 - 38 mg/dL  C-reactive protein     Status: None   Collection Time: 12/14/16  9:43 AM  Result Value Ref Range   CRP <0.8 <1.0 mg/dL  Comprehensive metabolic panel     Status: None   Collection Time: 12/14/16  9:43 AM  Result Value Ref Range   Sodium 137 135 - 145 mmol/L   Potassium 3.7 3.5 - 5.1 mmol/L   Chloride 106 101 - 111 mmol/L   CO2 22 22 - 32 mmol/L   Glucose, Bld 88 65 - 99 mg/dL   BUN 9 6 - 20 mg/dL   Creatinine, Ser 0.87 0.44 - 1.00 mg/dL   Calcium 9.0 8.9 - 10.3 mg/dL   Total Protein 7.2 6.5 - 8.1 g/dL   Albumin 3.8 3.5 - 5.0 g/dL   AST 17 15 - 41 U/L   ALT 15 14 - 54 U/L   Alkaline Phosphatase 99 38 - 126 U/L   Total Bilirubin 0.5 0.3 - 1.2 mg/dL   GFR calc non Af Amer >60 >60 mL/min   GFR calc Af Amer >60 >60 mL/min    Comment: (NOTE) The eGFR has been calculated using the CKD EPI equation. This calculation has not been validated in all clinical situations. eGFR's persistently <60 mL/min signify possible Chronic Kidney Disease.    Anion gap 9 5 - 15  CBC with Differential/Platelet     Status: Abnormal   Collection Time: 12/14/16  9:43 AM  Result Value Ref Range   WBC 6.5 4.0 - 10.5 K/uL   RBC 4.56 3.87 - 5.11 MIL/uL   Hemoglobin 10.9 (L) 12.0 - 15.0 g/dL   HCT 34.5 (L) 36.0 - 46.0 %   MCV 75.7 (L) 78.0 - 100.0 fL   MCH 23.9 (L) 26.0 - 34.0 pg   MCHC 31.6 30.0 - 36.0 g/dL   RDW 15.5 11.5 - 15.5 %   Platelets 401 (H) 150 - 400 K/uL   Neutrophils Relative % 73  %   Neutro Abs 4.8 1.7 - 7.7 K/uL   Lymphocytes Relative 17 %   Lymphs Abs 1.1 0.7 - 4.0 K/uL   Monocytes Relative 8 %   Monocytes Absolute 0.5 0.1 - 1.0 K/uL   Eosinophils Relative 1 %   Eosinophils Absolute 0.0 0.0 - 0.7 K/uL   Basophils Relative 1 %   Basophils Absolute 0.0 0.0 - 0.1 K/uL  hCG, serum, qualitative     Status: None   Collection Time: 12/14/16  9:43 AM  Result Value Ref Range   Preg, Serum NEGATIVE NEGATIVE    Comment:        THE SENSITIVITY OF THIS METHODOLOGY IS >10 mIU/mL.   Hemoglobin A1c     Status: None   Collection Time: 12/14/16  9:44 AM  Result Value Ref Range   Hgb A1c MFr Bld 5.2 4.8 - 5.6 %    Comment: (NOTE) Pre diabetes:          5.7%-6.4% Diabetes:              >6.4% Glycemic control for   <7.0% adults with diabetes    Mean Plasma Glucose 102.54 mg/dL   Dg Chest Portable 1 View  Result Date: 12/14/2016 CLINICAL DATA:  Preop left leg surgery. EXAM: PORTABLE CHEST 1 VIEW COMPARISON:  None. FINDINGS: Heart and mediastinal contours are within normal limits. No focal opacities or  effusions. No acute bony abnormality. IMPRESSION: No active disease. Electronically Signed   By: Rolm Baptise M.D.   On: 12/14/2016 09:55   ROS No recent fever, bleeding abnormalities, urologic dysfunction, GI problems, or weight gain.  Blood pressure 117/86, pulse 73, temperature 98.3 F (36.8 C), temperature source Oral, resp. rate 20, height _0  (1.778 m), weight 90.7 kg (200 lb), last menstrual period 12/09/2016, SpO2 100 %. Physical Exam RRR No wheezing  LLE No traumatic wounds, ecchymosis, or rash  Tender hardware and fracture site  No knee or ankle effusion  Knee stable to varus/ valgus and anterior/posterior stress  Sens DPN, SPN, TN intact  Motor EHL, ext, flex, evers 5/5  DP 2+, PT 2+, No significant edema  Assessment/Plan Left tibia and fibula nonunions for exchange nailing and revision ORIF, possible autografting or Infuse allografting  I discussed  with the patient the risks and benefits of surgery, including the possibility of infection, nerve injury, vessel injury, wound breakdown, arthritis, symptomatic hardware, DVT/ PE, loss of motion, malunion, nonunion, and need for further surgery among others.  She acknowledged these risks and wished to proceed.  Altamese Wheeler, MD [facility] [phone] (p)  12/14/2016, 11:25 AM

## 2016-12-14 NOTE — Anesthesia Procedure Notes (Addendum)
Anesthesia Regional Block: Adductor canal block   Pre-Anesthetic Checklist: ,,, Correct Patient, Correct Site, Correct Laterality, Correct Procedure, Correct Position, site marked, risks and benefits discussed, Surgical consent,  Pre-op evaluation,  At surgeon's request and post-op pain management  Laterality: Left  Prep: chloraprep       Needles:  Injection technique: Single-shot  Needle Type: Echogenic Stimulator Needle     Needle Length: 9cm      Additional Needles:   Procedures:,,,, ultrasound used (permanent image in chart),,,,  Narrative:  Start time: 12/14/2016 11:23 AM End time: 12/14/2016 11:28 AM Injection made incrementally with aspirations every 5 mL.  Performed by: Personally  Anesthesiologist: Mal Amabile  Additional Notes: Relevant anatomy ID'd using [place]. Incremental 2-73ml injection of LA with frequent aspiration. Patient tolerated procedure well.

## 2016-12-14 NOTE — Brief Op Note (Signed)
12/14/2016  6:03 PM  PATIENT:  Angela Harrison  41 y.o. female  PRE-OPERATIVE DIAGNOSIS:  1. LEFT DISTAL TIBIA NONUNION S/P INTRAMEDULLARY NAILING 2. LEFT FIBULA NONUNION S/P OPEN REDUCTION INTERNAL FIXATION  POST-OPERATIVE DIAGNOSIS:  1. LEFT DISTAL TIBIA NONUNION S/P INTRAMEDULLARY NAILING 2. LEFT FIBULA NONUNION S/P OPEN REDUCTION INTERNAL FIXATION  PROCEDURE:  Procedure(s): 1. INTRAMEDULLARY (IM) NAIL LEFT TIBIAL (Left) WITH STRYKER 11 mm X 375 mm, statically locked 2. NON-UNION REPAIR LEFT FIBULA FRACTURE (Left) 3. HARDWARE REMOVAL LEFT TIBIA (Left) 4. HARDWARE REMOVAL LEFT FIBULA (Left)  SURGEON:  Surgeon(s) and Role:    Myrene Galas, MD - Primary  PHYSICIAN ASSISTANT: Montez Morita, PA-C  ANESTHESIA:   general AND REGIONAL  EBL:  Total I/O In: 1700 [I.V.:1700] Out: 900 [Urine:800; Blood:100]  BLOOD ADMINISTERED:none  DRAINS: none   LOCAL MEDICATIONS USED:  NONE  SPECIMEN:  Source of Specimen:  REAMINGS LEFT TIBIA  DISPOSITION OF SPECIMEN:  MICRO  COUNTS:  YES  TOURNIQUET:  * No tourniquets in log *  DICTATION: .Other Dictation: Dictation Number [id number]  PLAN OF CARE: Admit to inpatient   PATIENT DISPOSITION:  PACU - hemodynamically stable.   Delay start of Pharmacological VTE agent (>24hrs) due to surgical blood loss or risk of bleeding: no

## 2016-12-14 NOTE — Progress Notes (Signed)
Orthopedic Tech Progress Note Patient Details:  Angela Harrison Jan 04, 1976 [id number]  Ortho Devices Type of Ortho Device: Postop shoe/boot Ortho Device/Splint Location: Dropped off prafo boot at nurse station as request by OR nurse/staff.     Alvina Chou 12/14/2016, 3:54 PM  Provided Prafo boot at the Order/request of Dr. Carola Frost.

## 2016-12-14 NOTE — Anesthesia Procedure Notes (Signed)
Procedure Name: Intubation Date/Time: 12/14/2016 12:05 PM Performed by: Freddie Breech Pre-anesthesia Checklist: Patient identified, Emergency Drugs available, Suction available and Patient being monitored Patient Re-evaluated:Patient Re-evaluated prior to induction Oxygen Delivery Method: Circle System Utilized Preoxygenation: Pre-oxygenation with 100% oxygen Induction Type: IV induction Ventilation: Mask ventilation without difficulty Laryngoscope Size: Mac and 3 Grade View: Grade I Tube type: Oral Tube size: 7.0 mm Number of attempts: 1 Airway Equipment and Method: Stylet and Oral airway Placement Confirmation: ETT inserted through vocal cords under direct vision,  positive ETCO2 and breath sounds checked- equal and bilateral Secured at: 21 cm Tube secured with: Tape Dental Injury: Teeth and Oropharynx as per pre-operative assessment

## 2016-12-14 NOTE — Anesthesia Postprocedure Evaluation (Signed)
Anesthesia Post Note  Patient: Angela Harrison  Procedure(s) Performed: Procedure(s) (LRB): HARDWARE REMOVAL LEFT TIBIA (Left) INTRAMEDULLARY (IM) NAIL LEFT TIBIAL (Left) NON-UNION REPAIR LEFT FIBULA FRACTURE (Left)     Patient location during evaluation: PACU Anesthesia Type: General Level of consciousness: awake, awake and alert and oriented Pain management: pain level controlled Vital Signs Assessment: post-procedure vital signs reviewed and stable Respiratory status: spontaneous breathing, nonlabored ventilation and respiratory function stable Cardiovascular status: blood pressure returned to baseline Anesthetic complications: no    Last Vitals:  Vitals:   12/14/16 0925 12/14/16 1604  BP: 117/86 110/64  Pulse: 73 67  Resp: 20 14  Temp: 36.8 C 36.6 C  SpO2: 100% 100%    Last Pain:  Vitals:   12/14/16 0953  TempSrc:   PainSc: 6                  Reyne Falconi COKER

## 2016-12-15 ENCOUNTER — Encounter (HOSPITAL_COMMUNITY): Payer: Self-pay | Admitting: Orthopedic Surgery

## 2016-12-15 DIAGNOSIS — E669 Obesity, unspecified: Secondary | ICD-10-CM | POA: Diagnosis not present

## 2016-12-15 DIAGNOSIS — M1990 Unspecified osteoarthritis, unspecified site: Secondary | ICD-10-CM | POA: Diagnosis present

## 2016-12-15 DIAGNOSIS — Q796 Ehlers-Danlos syndrome: Secondary | ICD-10-CM | POA: Diagnosis not present

## 2016-12-15 DIAGNOSIS — G43909 Migraine, unspecified, not intractable, without status migrainosus: Secondary | ICD-10-CM | POA: Diagnosis not present

## 2016-12-15 DIAGNOSIS — S82309A Unspecified fracture of lower end of unspecified tibia, initial encounter for closed fracture: Secondary | ICD-10-CM | POA: Diagnosis not present

## 2016-12-15 DIAGNOSIS — Z881 Allergy status to other antibiotic agents status: Secondary | ICD-10-CM | POA: Diagnosis not present

## 2016-12-15 DIAGNOSIS — G8929 Other chronic pain: Secondary | ICD-10-CM | POA: Diagnosis not present

## 2016-12-15 DIAGNOSIS — G522 Disorders of vagus nerve: Secondary | ICD-10-CM | POA: Diagnosis not present

## 2016-12-15 DIAGNOSIS — Q7962 Hypermobile Ehlers-Danlos syndrome: Secondary | ICD-10-CM

## 2016-12-15 DIAGNOSIS — Z6828 Body mass index (BMI) 28.0-28.9, adult: Secondary | ICD-10-CM | POA: Diagnosis not present

## 2016-12-15 DIAGNOSIS — D649 Anemia, unspecified: Secondary | ICD-10-CM | POA: Diagnosis not present

## 2016-12-15 DIAGNOSIS — Z79899 Other long term (current) drug therapy: Secondary | ICD-10-CM | POA: Diagnosis not present

## 2016-12-15 DIAGNOSIS — Z96642 Presence of left artificial hip joint: Secondary | ICD-10-CM | POA: Diagnosis not present

## 2016-12-15 DIAGNOSIS — X58XXXA Exposure to other specified factors, initial encounter: Secondary | ICD-10-CM | POA: Diagnosis not present

## 2016-12-15 DIAGNOSIS — S82302K Unspecified fracture of lower end of left tibia, subsequent encounter for closed fracture with nonunion: Secondary | ICD-10-CM | POA: Diagnosis not present

## 2016-12-15 DIAGNOSIS — E559 Vitamin D deficiency, unspecified: Secondary | ICD-10-CM | POA: Diagnosis not present

## 2016-12-15 DIAGNOSIS — S82492K Other fracture of shaft of left fibula, subsequent encounter for closed fracture with nonunion: Secondary | ICD-10-CM | POA: Diagnosis not present

## 2016-12-15 LAB — URINE CULTURE

## 2016-12-15 LAB — VITAMIN D 25 HYDROXY (VIT D DEFICIENCY, FRACTURES): Vit D, 25-Hydroxy: 19.6 ng/mL — ABNORMAL LOW (ref 30.0–100.0)

## 2016-12-15 LAB — BASIC METABOLIC PANEL
Anion gap: 7 (ref 5–15)
BUN: 7 mg/dL (ref 6–20)
CO2: 25 mmol/L (ref 22–32)
Calcium: 8.4 mg/dL — ABNORMAL LOW (ref 8.9–10.3)
Chloride: 101 mmol/L (ref 101–111)
Creatinine, Ser: 0.91 mg/dL (ref 0.44–1.00)
GFR calc Af Amer: >60 mL/min (ref >60)
GFR calc non Af Amer: >60 mL/min (ref >60)
Glucose, Bld: 115 mg/dL — ABNORMAL HIGH (ref 65–99)
Potassium: 4.2 mmol/L (ref 3.5–5.1)
Sodium: 133 mmol/L — ABNORMAL LOW (ref 135–145)

## 2016-12-15 LAB — CBC
HCT: 29.7 % — ABNORMAL LOW (ref 36.0–46.0)
Hemoglobin: 9.2 g/dL — ABNORMAL LOW (ref 12.0–15.0)
MCH: 24.1 pg — ABNORMAL LOW (ref 26.0–34.0)
MCHC: 31 g/dL (ref 30.0–36.0)
MCV: 77.7 fL — ABNORMAL LOW (ref 78.0–100.0)
Platelets: 339 10*3/uL (ref 150–400)
RBC: 3.82 MIL/uL — ABNORMAL LOW (ref 3.87–5.11)
RDW: 15.8 % — ABNORMAL HIGH (ref 11.5–15.5)
WBC: 9.4 10*3/uL (ref 4.0–10.5)

## 2016-12-15 LAB — CALCITRIOL (1,25 DI-OH VIT D): Vit D, 1,25-Dihydroxy: 47.1 pg/mL (ref 19.9–79.3)

## 2016-12-15 LAB — PTH, INTACT AND CALCIUM
Calcium, Total (PTH): 8.9 mg/dL (ref 8.7–10.2)
PTH: 53 pg/mL (ref 15–65)

## 2016-12-15 LAB — CALCIUM, IONIZED: Calcium, Ionized, Serum: 5 mg/dL (ref 4.5–5.6)

## 2016-12-15 MED ORDER — OXYCODONE HCL 5 MG PO TABS: 5.0000 mg | ORAL_TABLET | Freq: Four times a day (QID) | ORAL | 0 refills | Status: DC | PRN

## 2016-12-15 MED ORDER — DOCUSATE SODIUM 100 MG PO CAPS: 100.0000 mg | ORAL_CAPSULE | Freq: Two times a day (BID) | ORAL | 1 refills | Status: DC

## 2016-12-15 MED ORDER — ENOXAPARIN SODIUM 40 MG/0.4ML ~~LOC~~ SOLN: 40.0000 mg | SUBCUTANEOUS | 0 refills | Status: DC

## 2016-12-15 MED ORDER — METHOCARBAMOL 500 MG PO TABS: 500.0000 mg | ORAL_TABLET | Freq: Four times a day (QID) | ORAL | 1 refills | Status: DC | PRN

## 2016-12-15 MED ORDER — OXYCODONE-ACETAMINOPHEN 5-325 MG PO TABS: 1.0000 | ORAL_TABLET | Freq: Four times a day (QID) | ORAL | 0 refills | Status: DC | PRN

## 2016-12-15 MED ORDER — ENOXAPARIN (LOVENOX) PATIENT EDUCATION KIT: 1.0000 | PACK | Freq: Once | 0 refills | Status: AC

## 2016-12-15 MED ORDER — ENOXAPARIN (LOVENOX) PATIENT EDUCATION KIT
PACK | Freq: Once | Status: AC
  Administered 2016-12-15: 10:00:00
  Filled 2016-12-15: qty 1

## 2016-12-15 MED ORDER — POLYETHYLENE GLYCOL 3350 17 G PO PACK: 17.0000 g | PACK | Freq: Every day | ORAL | 0 refills | Status: DC

## 2016-12-15 NOTE — Evaluation (Signed)
Physical Therapy Evaluation Patient Details Name: Angela Harrison MRN: [id number] DOB: [birth year] Today's Date: 12/15/2016   History of Present Illness  Pt is a 41 y.o. female post-previous low energy fx s/p IM nailing and ORIF with nonunions of both; now s/p L tibial ORIF with hardware removal on 12/14/16 (TDWB w/ CAM boot). Pertint PMH includes Ehlers-Danlos syndrome, syncope, arthritis, L THA.     Clinical Impression  Pt presents with pain and an overall decrease in functional mobility secondary to above. PTA, pt mod indep with bilateral crutches since original LLE sx a few months ago; lives with family who will be able to provide 24/7 assist if needed. Educ on precautions, CAM boot application, positioning, and importance of mobility. Educated on and completed LE therex; HEP handout provided, including dosage and progression of various exercises as able. Today, pt progressed to mod indep with crutches for transfers and amb; supervision for stairs with crutches, which pt will have assist at home. All education completed and questions answered. Feel pt currently has no acute PT needs, and will be safe to d/c home with crutches and supervision from family. OT will plan to evaluate today.     Follow Up Recommendations DC plan and follow up therapy as arranged by surgeon;Supervision - Intermittent    Equipment Recommendations  Crutches    Recommendations for Other Services       Precautions / Restrictions Precautions Precautions: None Restrictions Weight Bearing Restrictions: Yes LLE Weight Bearing: Touchdown weight bearing Other Position/Activity Restrictions: TDWB in CAM boot      Mobility  Bed Mobility Overal bed mobility: Modified Independent Bed Mobility: Supine to Sit;Sit to Supine              Transfers Overall transfer level: Modified independent Equipment used: Rolling walker (2 wheeled);Crutches             General transfer comment: Initial standing and amb  with RW; mod indep. Progressed to bilat axillary crutches with cues for technique for transfers  Ambulation/Gait Ambulation/Gait assistance: Modified independent (Device/Increase time) Ambulation Distance (Feet): 80 Feet Assistive device: Rolling walker (2 wheeled);Crutches   Gait velocity: Decreased   General Gait Details: Initial amb to chair with RW. Progressed to amb with bilat axillary crutches with cues for technique, utilizing hop-to, progressing to hop-through gait pattern; pt with great technique maintaining LLE NWB precautions. Further mobility limited secondary to "feeling groggy after receiving dilaudid"  Stairs Stairs: Yes Stairs assistance: Supervision Stair Management: No rails;Step to pattern;Forwards;With crutches Number of Stairs: 1 General stair comments: Ascend/descended 1 step with bilat axillary crutches; educ on technique. Supervision for safety, which pt reports will have at home  Wheelchair Mobility    Modified Rankin (Stroke Patients Only)       Balance Overall balance assessment: Needs assistance   Sitting balance-Leahy Scale: Normal     Standing balance support: Single extremity supported;Bilateral upper extremity supported;No upper extremity supported Standing balance-Leahy Scale: Fair Standing balance comment: Able to static stand with no UE support & maintain LLE NWB precautions, min guard for balance                             Pertinent Vitals/Pain Pain Assessment: Faces Faces Pain Scale: Hurts even more Pain Location: LLE Pain Descriptors / Indicators: Aching;Discomfort;Sore Pain Intervention(s): Limited activity within patient's tolerance;Monitored during session;Repositioned;Ice applied;Patient requesting pain meds-RN notified    Home Living Family/patient expects to be discharged to:: Private residence Living  Arrangements: Spouse/significant other Available Help at Discharge: Family;Available 24 hours/day;Friend(s) Type  of Home: House Home Access: Stairs to enter   [employer] of Steps: 1 threshold Home Layout: One level Home Equipment: Walker - 2 wheels;Bedside commode 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, government    Prior Function Level of Independence: Independent with assistive device(s)         Comments: Mod indep with crutches borrowed from a friend (will need to give back)     Hand Dominance        Extremity/Trunk Assessment   Upper Extremity Assessment Upper Extremity Assessment: Overall WFL for tasks assessed    Lower Extremity Assessment Lower Extremity Assessment: LLE deficits/detail LLE Deficits / Details: L hip flex 4/5, knee flex/ext 3/5, ankle PF/DF 3/5 LLE: Unable to fully assess due to pain;Unable to fully assess due to immobilization       Communication   Communication: No difficulties  Cognition Arousal/Alertness: Awake/alert Behavior During Therapy: WFL for tasks assessed/performed Overall Cognitive Status: Within Functional Limits for tasks assessed                                        General Comments General comments (skin integrity, edema, etc.): Family member present throughout session and very supportive    Exercises General Exercises - Lower Extremity Ankle Circles/Pumps: AROM;Both;10 reps;Seated Quad Sets: AROM;Left;5 reps;Seated Long Arc Quad: AROM;Left;10 reps;Seated Heel Slides: AAROM;Left;5 reps;Seated Straight Leg Raises: AROM;Left;5 reps;Supine Hip Flexion/Marching: AROM;Left;Seated;10 reps Other Exercises Other Exercises: Gastroc, quad, hamstring stretch Other Exercises: 4-way ankle w/ green theraband (demonstration/practice on RLE) Other Exercises: Educ on eventual progression to hip flex/ext and LAQ with theraband   Assessment/Plan    PT Assessment Patent does not need any further PT services  PT Problem List         PT Treatment Interventions      PT Goals (Current goals can be found in the  Care Plan section)  Acute Rehab PT Goals Patient Stated Goal: Return home with less pain PT Goal Formulation: With patient Time For Goal Achievement: 12/29/16 Potential to Achieve Goals: Good    Frequency     Barriers to discharge        Co-evaluation               AM-PAC PT "6 Clicks" Daily Activity  Outcome Measure Difficulty turning over in bed (including adjusting bedclothes, sheets and blankets)?: None Difficulty moving from lying on back to sitting on the side of the bed? : None Difficulty sitting down on and standing up from a chair with arms (e.g., wheelchair, bedside commode, etc,.)?: None Help needed moving to and from a bed to chair (including a wheelchair)?: None Help needed walking in hospital room?: None Help needed climbing 3-5 steps with a railing? : A Little 6 Click Score: 23    End of Session Equipment Utilized During Treatment: Gait belt Activity Tolerance: Patient tolerated treatment well;Patient limited by fatigue Patient left: in bed;with call bell/phone within reach;with family/visitor present Nurse Communication: Mobility status PT Visit Diagnosis: Other abnormalities of gait and mobility (R26.89);Pain Pain - Right/Left: Left Pain - part of body: Leg    Time: 1610-9604 PT Time Calculation (min) (ACUTE ONLY): 41 min   Charges:   PT Evaluation $PT Eval Low Complexity: 1 Low PT Treatments $Gait Training: 8-22 mins $Therapeutic Exercise: 8-22 mins   PT G Codes:  PT G-Codes **NOT FOR INPATIENT CLASS** Functional Assessment Tool Used: AM-PAC 6 Clicks Basic Mobility Functional Limitation: Mobility: Walking and moving around Mobility: Walking and Moving Around Current Status (Z6109): At least 1 percent but less than 20 percent impaired, limited or restricted Mobility: Walking and Moving Around Goal Status [phone]): At least 1 percent but less than 20 percent impaired, limited or restricted Mobility: Walking and Moving Around Discharge Status  [phone]): At least 1 percent but less than 20 percent impaired, limited or restricted   Ina Homes, PT, DPT Acute Rehab Services  Pager: [phone]  Malachy Chamber 12/15/2016, 10:56 AM

## 2016-12-15 NOTE — Progress Notes (Signed)
Reviewed Lovenox Injection kit with mother and patient with full understanding

## 2016-12-15 NOTE — Progress Notes (Signed)
Orthopaedic Trauma Service (OTS)  1 Day Post-Op Procedure(s) (LRB): HARDWARE REMOVAL LEFT TIBIA (Left) INTRAMEDULLARY (IM) NAIL LEFT TIBIAL (Left) NON-UNION REPAIR LEFT FIBULA FRACTURE (Left)  Subjective: Patient reports pain as moderate and has been receiving  of Dilaudid despite block still having some signficant effect. All smiles and very pleasant despite this..    Objective: Current Vitals Blood pressure (!) 89/53, pulse (!) 57, temperature 97.8 F (36.6 C), temperature source Oral, resp. rate 20, height  (1.778 m), weight 90.7 kg (200 lb), last menstrual period 12/09/2016, SpO2 100 %. Vital signs in last 24 hours: Temp:  [97.6 F (36.4 C)-98.5 F (36.9 C)] 97.8 F (36.6 C) (09/21 0500) Pulse Rate:  [50-77] 57 (09/21 0500) Resp:  [11-22] 20 (09/21 0500) BP: (89-117)/(53-86) 89/53 (09/21 0500) SpO2:  [98 %-100 %] 100 % (09/21 0500) Weight:  [90.7 kg (200 lb)] 90.7 kg (200 lb) (09/20 0953)  Intake/Output from previous day: 09/20 0701 - 09/21 0700 In: 2781.3 [P.O.:240; I.V.:2541.3] Out: 1200 [Urine:1100; Blood:100]  LABS  Recent Labs  12/14/16 0943 12/15/16 0331  HGB 10.9* 9.2*    Recent Labs  12/14/16 0943 12/15/16 0331  WBC 6.5 9.4  RBC 4.56 3.82*  HCT 34.5* 29.7*  PLT 401* 339    Recent Labs  12/14/16 0943 12/15/16 0331  NA 137 133*  K 3.7 4.2  CL 106 101  CO2 22 25  BUN 9 7  CREATININE 0.87 0.91  GLUCOSE 88 115*  CALCIUM 9.0  8.9 8.4*    Recent Labs  12/14/16 0943  INR 1.10     Physical Exam LLE  Dressing intact, clean, dry  Edema/ swelling controlled  Sens: DPN, SPN, TN not intact secondary to block  Motor: EHL, FHL, and lessor toe ext and flex all under effect of block  Brisk cap refill, warm to touch  Assessment/Plan: 1 Day Post-Op Procedure(s) (LRB): HARDWARE REMOVAL LEFT TIBIA (Left) INTRAMEDULLARY (IM) NAIL LEFT TIBIAL (Left) NON-UNION REPAIR LEFT FIBULA FRACTURE (Left) 1. PT/OT PWB on LLE  2. DVT proph  Lovenox 3. F/u 8-14 days after anticipated d/c tomorrow as patient still requiring IV meds and block partially in place. 4. Ice and elevation now.  Myrene Galas, MD [facility] [phone] (p)

## 2016-12-15 NOTE — Op Note (Signed)
NAMEJILLYAN, PLITT NO.:  [id number]  MEDICAL RECORD NO.:  [id number]  LOCATION:  [facility]                         FACILITY:  [facility]  PHYSICIAN:  Doralee Albino. Carola Frost, M.D. DATE OF BIRTH:  07-19-1975  DATE OF PROCEDURE:  12/14/2016 DATE OF DISCHARGE:                              OPERATIVE REPORT   PREOPERATIVE DIAGNOSES: 1. Left distal tibia nonunion, status post intramedullary nailing. 2. Left fibular nonunion, status post open reduction and internal     fixation.  POSTOPERATIVE DIAGNOSES: 1. Left distal tibia nonunion, status post intramedullary nailing. 2. Left fibular nonunion, status post open reduction and internal     fixation.  PROCEDURES: 1. Intramedullary nailing of the left tibia using a Stryker 11 mm x     375 mm statically locked nail. 2. Nonunion repair, left fibular fracture. 3. Hardware removal, left tibia. 4. Hardware removal, left fibula.  SURGEON:  Doralee Albino. Carola Frost, M.D.  ASSISTANT:  Montez Morita, PA-C.  ANESTHESIA:  General.  COMPLICATIONS:  Supplemented with regional block.  COMPLICATIONS:  None.  SPECIMENS:  Two anaerobic, aerobic cultures sent from reamings of the left tibia to Micro.  TOURNIQUET:  None.  DISPOSITION:  To PACU.  CONDITION:  Stable.  BRIEF SUMMARY AND INDICATION FOR PROCEDURE:  The patient is a 41 year old female with Ehlers-Danlos and significant musculoskeletal history, who presents for definitive treatment of her left tibia and fibula nonunions.  The patient did not heal despite a low-energy fracture appropriate treatment with excellent reduction by Dr. Duwayne Heck and avoidance of other risks such as smoking and NSAIDs use.  In addition, she had a bone stimulator.  Because of these factors and the technical difficulty of revising this nonunion of both the tibia and fibula, Dr. Aundria Rud asserted this was outside of his scope of practice and that these injuries would be best managed by fellowship trained  orthopedic traumatologist.  Consequently, she was referred to [place] for further consultation.  I did discuss the risks and benefits of surgery including our plan for placement of a new nail with different locking bolts in a different position, the need for an additional and separate incision away from the quad splitting incision for the suprapatellar nail that had been placed by Dr. Aundria Rud so that we could better identify and remove the nail more reliably and the need to revise her fixation of the lateral ankle given that the screws were visible beneath her and through the skin in addition to being quite prominent.  She acknowledged these risks as did her husband.  They strongly wished to proceed.  BRIEF SUMMARY OF PROCEDURE:  The patient was given preoperative antibiotics, which consisted of clindamycin, taken to the operating room where general anesthesia was induced.  Her left lower extremity was prepped and draped in usual sterile fashion.  Again, no tourniquet was used during the procedure.  After time-out, I began with removal of the nail, making a new incision directly over the distal aspect of the patella extending proximally and making a medial parapatellar incision, inserting a curette into the apex of the nail.  This was confirmed on orthogonal views.  I then made small stab incisions to remove the locking bolts including  one that was slightly more extended anteriorly in order to safely retract the dorsalis pedis artery and nerve.  After removal of all screws, the extraction bolt was engaged and the nail extracted without complication.  I then inserted a ball-tipped guidewire and sequentially reamed the tibia both to generate autograft within the tibia as well as to prepare for a larger-caliber nail.  I did send some of these reamings to Pathology.  I reamed up to 12 mm.  There was no sign of infection.  I was unable to retrieve sufficient autograft from this reaming process  within the reamers themselves and extruding from the tibia in order to graft the fibula and consequently a decision was made to go ahead and prepare the Infuse allograft for the fibula while continuing with the tibia.  My assistant held deliver the tibia throughout this process.  We then turned our attention distally where we placed an anterior to posterior blocking screw in the distal fragment to generate force toward correction of the small amount of deformity that she had as well as to provide additional stability.  This was checked on multiple views ensuring it was bicortical and at the appropriate position.  I then re-passed the reamers beginning with 9 and going up to 12.  I then inserted the nail and changed the rotation of the nail to obtain fresh tracts spanning it into slight internal rotation such that the locking bolts would still be away from the fibula.  This was passed down to the plafond distally so that I could get to medial to lateral locking screws within the nail below the blocking screw.  This was achieved using perfect circle technique.  The proximal locks were placed off the guide.  Wounds were irrigated and then attention turned to the fibula.  Again, Montez Morita, PAC, assisted me throughout with both the hardware removal there as well as the placement of the intramedullary nail and the reaming to generate the internal autograft.  It should be noted that new incisions were made for placement of all the new locking bolts.  With regard to the fibula, the old surgical incision was remade and again the skin was nearly translucent with clear visibility of the heads of the screws and all margins of the plate underneath the skin.  I carefully teased backed all the soft tissue along this down to the fibula.  I was able to then remove the hardware directly, which extended quite a bit proximal to where I needed to work for the nonunion as well as distal to it.  I identified  the fracture nonunion site extending in an oblique fashion from the anterior cortex proximally.  This was cleaned with a microcurette and then I irrigated thoroughly.  An Infuse sponge small size was packed into this and additional one draped over. Because of the soft tissue concerns, instrumentation could not be performed with lateral plating and consequently ORIF was performed of the lateral malleolus for repair of this nonunion using a 6.5 cannulated screw.  I measured this and seated it to the appropriate depth, gaining excellent purchase within the canal and also placed in the trajectory such that I did close the nonunion site which was open on the anterior side just slightly.  This achieved excellent compression and placement was confirmed on AP, mortise, and lateral views.  Montez Morita, PA-C, assisted me throughout with this as well.  Wound was irrigated thoroughly and then closed in a standard layered fashion.  It should  be noted that a distal-based incision was used for the placement of the cannulated screw.  PROGNOSIS:  Ms. Leach will be touchdown weightbearing and in a Cam boot for support.  We will have her begin immediate range of motion as soon as her pain allows.  We will follow up the cultures and anticipate discharge in 1-2 days depending upon her level of discomfort.  She will be on formal pharmacologic DVT prophylaxis.     Doralee Albino. Carola Frost, M.D.     [facility]/MEDQ  D:  12/14/2016  T:  12/15/2016  Job:  [id number]

## 2016-12-15 NOTE — Progress Notes (Signed)
Orthopedic Tech Progress Note Patient Details:  Angela Harrison 12-Jan-1976 [id number]  Ortho Devices Type of Ortho Device: CAM walker Ortho Device/Splint Location: lle Ortho Device/Splint Interventions: Application   Aritha Huckeba 12/15/2016, 10:22 AM

## 2016-12-15 NOTE — Progress Notes (Signed)
Orthopedic Tech Progress Note Patient Details:  Angela Harrison [birth year] [id number]  Ortho Devices Type of Ortho Device: Crutches Ortho Device/Splint Location: As requested by PT crutches dropped off at bedside.  (Orders located in "other orders") Ortho Device/Splint Interventions: Application   Alvina Chou 12/15/2016, 11:29 AM

## 2016-12-15 NOTE — Discharge Instructions (Signed)
Orthopaedic Trauma Service Discharge Instructions   General Discharge Instructions  WEIGHT BEARING STATUS: Touchdown weightbearing left leg in CAM boot (black boot)  RANGE OF MOTION/ACTIVITY: unrestricted range of motion left knee and ankle  Wound Care: daily wound care starting the day after you return home.  See below  Discharge Wound Care Instructions  Do NOT apply any ointments, solutions or lotions to pin sites or surgical wounds.  These prevent needed drainage and even though solutions like hydrogen peroxide kill bacteria, they also damage cells lining the pin sites that help fight infection.  Applying lotions or ointments can keep the wounds moist and can cause them to breakdown and open up as well. This can increase the risk for infection. When in doubt call the office.  Surgical incisions should be dressed daily.  If any drainage is noted, use one layer of adaptic/oil emulsion gauze, then 4x4 gauze, Kerlix, and an ace wrap (compression sock can be used instead of ace wrap).  Once the incision is completely dry and without drainage, it may be left open to air out.  Showering may begin 36-48 hours later.  Cleaning gently with soap and water.   DVT/PE prophylaxis: Lovenox x 21 days   Diet: as you were eating previously.  Can use over the counter stool softeners and bowel preparations, such as Miralax, to help with bowel movements.  Narcotics can be constipating.  Be sure to drink plenty of fluids  PAIN MEDICATION USE AND EXPECTATIONS  You have likely been given narcotic medications to help control your pain.  After a traumatic event that results in an fracture (broken bone) with or without surgery, it is ok to use narcotic pain medications to help control one's pain.  We understand that everyone responds to pain differently and each individual patient will be evaluated on a regular basis for the continued need for narcotic medications. Ideally, narcotic medication use should last no  more than 6-8 weeks (coinciding with fracture healing).   As a patient it is your responsibility as well to monitor narcotic medication use and report the amount and frequency you use these medications when you come to your office visit.   We would also advise that if you are using narcotic medications, you should take a dose prior to therapy to maximize you participation.  IF YOU ARE ON NARCOTIC MEDICATIONS IT IS NOT PERMISSIBLE TO OPERATE A MOTOR VEHICLE (MOTORCYCLE/CAR/TRUCK/MOPED) OR HEAVY MACHINERY DO NOT MIX NARCOTICS WITH OTHER CNS (CENTRAL NERVOUS SYSTEM) DEPRESSANTS SUCH AS ALCOHOL   STOP SMOKING OR USING NICOTINE PRODUCTS!!!!  As discussed nicotine severely impairs your body's ability to heal surgical and traumatic wounds but also impairs bone healing.  Wounds and bone heal by forming microscopic blood vessels (angiogenesis) and nicotine is a vasoconstrictor (essentially, shrinks blood vessels).  Therefore, if vasoconstriction occurs to these microscopic blood vessels they essentially disappear and are unable to deliver necessary nutrients to the healing tissue.  This is one modifiable factor that you can do to dramatically increase your chances of healing your injury.    (This means no smoking, no nicotine gum, patches, etc)  DO NOT USE NONSTEROIDAL ANTI-INFLAMMATORY DRUGS (NSAID'S)  Using products such as Advil (ibuprofen), Aleve (naproxen), Motrin (ibuprofen) for additional pain control during fracture healing can delay and/or prevent the healing response.  If you would like to take over the counter (OTC) medication, Tylenol (acetaminophen) is ok.  However, some narcotic medications that are given for pain control contain acetaminophen as well. Therefore, you  should not exceed more than 4000 mg of tylenol in a day if you do not have liver disease.  Also note that there are may OTC medicines, such as cold medicines and allergy medicines that my contain tylenol as well.  If you have any  questions about medications and/or interactions please ask your doctor/PA or your pharmacist.      ICE AND ELEVATE INJURED/OPERATIVE EXTREMITY  Using ice and elevating the injured extremity above your heart can help with swelling and pain control.  Icing in a pulsatile fashion, such as 20 minutes on and 20 minutes off, can be followed.    Do not place ice directly on skin. Make sure there is a barrier between to skin and the ice pack.    Using frozen items such as frozen peas works well as the conform nicely to the are that needs to be iced.  USE AN ACE WRAP OR TED HOSE FOR SWELLING CONTROL  In addition to icing and elevation, Ace wraps or TED hose are used to help limit and resolve swelling.  It is recommended to use Ace wraps or TED hose until you are informed to stop.    When using Ace Wraps start the wrapping distally (farthest away from the body) and wrap proximally (closer to the body)   Example: If you had surgery on your leg or thing and you do not have a splint on, start the ace wrap at the toes and work your way up to the thigh        If you had surgery on your upper extremity and do not have a splint on, start the ace wrap at your fingers and work your way up to the upper arm  IF YOU ARE IN A SPLINT OR CAST DO NOT REMOVE IT FOR ANY REASON   If your splint gets wet for any reason please contact the office immediately. You may shower in your splint or cast as long as you keep it dry.  This can be done by wrapping in a cast cover or garbage back (or similar)  Do Not stick any thing down your splint or cast such as pencils, money, or hangers to try and scratch yourself with.  If you feel itchy take benadryl as prescribed on the bottle for itching  IF YOU ARE IN A CAM BOOT (BLACK BOOT)  You may remove boot periodically. Perform daily dressing changes as noted below.  Wash the liner of the boot regularly and wear a sock when wearing the boot. It is recommended that you sleep in the boot until  told otherwise  CALL THE OFFICE WITH ANY QUESTIONS OR CONCERNS: [phone]

## 2016-12-15 NOTE — Evaluation (Signed)
Occupational Therapy Evaluation Patient Details Name: Angela Harrison MRN: [id number] DOB: [birth year] Today's Date: 12/15/2016    History of Present Illness Pt is a 41 y.o. female post-previous low energy fx s/p IM nailing and ORIF with nonunions of both; now s/p L tibial ORIF with hardware removal on 12/14/16 (TDWB w/ CAM boot). Pertint PMH includes Ehlers-Danlos syndrome, syncope, arthritis, L THA.    Clinical Impression   Pt admitted with the above diagnoses and presents with below problem list. PTA pt was independent with ADLs. Pt is currently setup to supervision with ADLs. Reviewed ADL education and home setup. No further OT needs indicated at this time. OT signing off.     Follow Up Recommendations  No OT follow up    Equipment Recommendations  None recommended by OT    Recommendations for Other Services       Precautions / Restrictions Precautions Precautions: None Restrictions Weight Bearing Restrictions: Yes LLE Weight Bearing: Touchdown weight bearing Other Position/Activity Restrictions: TDWB in CAM boot      Mobility Bed Mobility Overal bed mobility: Modified Independent Bed Mobility: Supine to Sit;Sit to Supine              Transfers Overall transfer level: Modified independent Equipment used: Rolling walker (2 wheeled);Crutches             General transfer comment: Initial standing and amb with RW; mod indep. Progressed to bilat axillary crutches with cues for technique for transfers    Balance Overall balance assessment: Needs assistance   Sitting balance-Leahy Scale: Normal     Standing balance support: Single extremity supported;Bilateral upper extremity supported;No upper extremity supported Standing balance-Leahy Scale: Fair Standing balance comment: Able to static stand with no UE support & maintain LLE NWB precautions, min guard for balance                           ADL either performed or assessed with clinical  judgement   ADL Overall ADL's : Needs assistance/impaired Eating/Feeding: Set up;Sitting   Grooming: Set up;Sitting   Upper Body Bathing: Set up;Sitting   Lower Body Bathing: Supervison/ safety;Sit to/from stand   Upper Body Dressing : Set up;Sitting   Lower Body Dressing: Supervision/safety;Sit to/from stand   Toilet Transfer: Supervision/safety;Ambulation;RW (crutched to return to bed)   Toileting- Clothing Manipulation and Hygiene: Supervision/safety;Sit to/from stand   Tub/ Shower Transfer: Tub transfer;Supervision/safety;Ambulation;Tub bench (crutches)   Functional mobility during ADLs: Min guard;Rolling walker General ADL Comments: Pt completed bed mobility, toilet transfer, and pericare as detailed above. Reviewed ADL education.      Vision         Perception     Praxis      Pertinent Vitals/Pain Pain Assessment: 0-10 Pain Score: 5  Faces Pain Scale: Hurts even more Pain Location: LLE Pain Descriptors / Indicators: Aching;Discomfort;Sore Pain Intervention(s): Limited activity within patient's tolerance;Monitored during session;Premedicated before session;Repositioned     Hand Dominance     Extremity/Trunk Assessment Upper Extremity Assessment Upper Extremity Assessment: Overall WFL for tasks assessed   Lower Extremity Assessment Lower Extremity Assessment: Defer to PT evaluation LLE Deficits / Details: L hip flex 4/5, knee flex/ext 3/5, ankle PF/DF 3/5 LLE: Unable to fully assess due to pain;Unable to fully assess due to immobilization       Communication Communication Communication: No difficulties   Cognition Arousal/Alertness: Awake/alert Behavior During Therapy: WFL for tasks assessed/performed Overall Cognitive Status: Within Functional Limits for tasks assessed  General Comments  Family member present throughout session and very supportive    Exercises General Exercises - Lower  Extremity Ankle Circles/Pumps: AROM;Both;10 reps;Seated Quad Sets: AROM;Left;5 reps;Seated Long Arc Quad: AROM;Left;10 reps;Seated Heel Slides: AAROM;Left;5 reps;Seated Straight Leg Raises: AROM;Left;5 reps;Supine Hip Flexion/Marching: AROM;Left;Seated;10 reps Other Exercises Other Exercises: Gastroc, quad, hamstring stretch Other Exercises: 4-way ankle w/ green theraband (demonstration/practice on RLE) Other Exercises: Educ on eventual progression to hip flex/ext and LAQ with theraband   Shoulder Instructions      Home Living Family/patient expects to be discharged to:: Private residence Living Arrangements: Spouse/significant other Available Help at Discharge: Family;Available 24 hours/day;Friend(s) Type of Home: House Home Access: Stairs to enter [employer] of Steps: 1 threshold   Home Layout: One level     Bathroom Shower/Tub: Chief Strategy Officer: Standard     Home Equipment: Environmental consultant - 2 wheels;Bedside commode;Tub bench   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, government      Prior Functioning/Environment Level of Independence: Independent with assistive device(s)        Comments: Mod indep with crutches borrowed from a friend (will need to give back)        OT Problem List: Impaired balance (sitting and/or standing);Decreased knowledge of use of DME or AE;Decreased knowledge of precautions;Pain      OT Treatment/Interventions:      OT Goals(Current goals can be found in the care plan section) Acute Rehab OT Goals Patient Stated Goal: Return home with less pain  OT Frequency:     Barriers to D/C:            Co-evaluation              AM-PAC PT "6 Clicks" Daily Activity     Outcome Measure Help from another person eating meals?: None Help from another person taking care of personal grooming?: None Help from another person toileting, which includes using toliet, bedpan, or urinal?: None Help from another person  bathing (including washing, rinsing, drying)?: None Help from another person to put on and taking off regular upper body clothing?: None Help from another person to put on and taking off regular lower body clothing?: None 6 Click Score: 24   End of Session Equipment Utilized During Treatment: Gait belt;Rolling walker;Other (comment) (crutches)  Activity Tolerance: Patient tolerated treatment well;Other (comment) ("swimmy-headed" 2/2 diluadid) Patient left: in bed;with call bell/phone within reach;with family/visitor present  OT Visit Diagnosis: Unsteadiness on feet (R26.81);Pain                Time: 8295-6213 OT Time Calculation (min): 20 min Charges:  OT General Charges $OT Visit: 1 Visit OT Evaluation $OT Eval Low Complexity: 1 Low G-Codes: OT G-codes **NOT FOR INPATIENT CLASS** Functional Assessment Tool Used: AM-PAC 6 Clicks Daily Activity Functional Limitation: Self care Self Care Current Status (Y8657): 0 percent impaired, limited or restricted Self Care Discharge Status (Q4696): 0 percent impaired, limited or restricted     Pilar Grammes 12/15/2016, 1:07 PM

## 2016-12-16 ENCOUNTER — Encounter (HOSPITAL_COMMUNITY): Payer: Self-pay | Admitting: Orthopedic Surgery

## 2016-12-16 DIAGNOSIS — E559 Vitamin D deficiency, unspecified: Secondary | ICD-10-CM | POA: Diagnosis present

## 2016-12-16 DIAGNOSIS — Q796 Ehlers-Danlos syndrome: Secondary | ICD-10-CM | POA: Diagnosis not present

## 2016-12-16 DIAGNOSIS — G8929 Other chronic pain: Secondary | ICD-10-CM | POA: Diagnosis not present

## 2016-12-16 DIAGNOSIS — G43909 Migraine, unspecified, not intractable, without status migrainosus: Secondary | ICD-10-CM | POA: Diagnosis not present

## 2016-12-16 DIAGNOSIS — S82309A Unspecified fracture of lower end of unspecified tibia, initial encounter for closed fracture: Secondary | ICD-10-CM | POA: Diagnosis not present

## 2016-12-16 DIAGNOSIS — S82302K Unspecified fracture of lower end of left tibia, subsequent encounter for closed fracture with nonunion: Secondary | ICD-10-CM | POA: Diagnosis not present

## 2016-12-16 DIAGNOSIS — Z6828 Body mass index (BMI) 28.0-28.9, adult: Secondary | ICD-10-CM | POA: Diagnosis not present

## 2016-12-16 DIAGNOSIS — X58XXXA Exposure to other specified factors, initial encounter: Secondary | ICD-10-CM | POA: Diagnosis not present

## 2016-12-16 DIAGNOSIS — Z881 Allergy status to other antibiotic agents status: Secondary | ICD-10-CM | POA: Diagnosis not present

## 2016-12-16 DIAGNOSIS — M1990 Unspecified osteoarthritis, unspecified site: Secondary | ICD-10-CM | POA: Diagnosis not present

## 2016-12-16 DIAGNOSIS — G522 Disorders of vagus nerve: Secondary | ICD-10-CM | POA: Diagnosis not present

## 2016-12-16 DIAGNOSIS — Z96642 Presence of left artificial hip joint: Secondary | ICD-10-CM | POA: Diagnosis not present

## 2016-12-16 DIAGNOSIS — D649 Anemia, unspecified: Secondary | ICD-10-CM | POA: Diagnosis not present

## 2016-12-16 DIAGNOSIS — S82492K Other fracture of shaft of left fibula, subsequent encounter for closed fracture with nonunion: Secondary | ICD-10-CM | POA: Diagnosis not present

## 2016-12-16 DIAGNOSIS — E669 Obesity, unspecified: Secondary | ICD-10-CM | POA: Diagnosis not present

## 2016-12-16 DIAGNOSIS — Z79899 Other long term (current) drug therapy: Secondary | ICD-10-CM | POA: Diagnosis not present

## 2016-12-16 HISTORY — DX: Vitamin D deficiency, unspecified: E55.9

## 2016-12-16 LAB — BASIC METABOLIC PANEL
Anion gap: 4 — ABNORMAL LOW (ref 5–15)
BUN: <5 mg/dL — ABNORMAL LOW (ref 6–20)
CO2: 26 mmol/L (ref 22–32)
Calcium: 8.1 mg/dL — ABNORMAL LOW (ref 8.9–10.3)
Chloride: 103 mmol/L (ref 101–111)
Creatinine, Ser: 0.83 mg/dL (ref 0.44–1.00)
GFR calc Af Amer: >60 mL/min (ref >60)
GFR calc non Af Amer: >60 mL/min (ref >60)
Glucose, Bld: 112 mg/dL — ABNORMAL HIGH (ref 65–99)
Potassium: 3.6 mmol/L (ref 3.5–5.1)
Sodium: 133 mmol/L — ABNORMAL LOW (ref 135–145)

## 2016-12-16 LAB — CBC
HCT: 26.6 % — ABNORMAL LOW (ref 36.0–46.0)
Hemoglobin: 8.3 g/dL — ABNORMAL LOW (ref 12.0–15.0)
MCH: 24.2 pg — ABNORMAL LOW (ref 26.0–34.0)
MCHC: 31.2 g/dL (ref 30.0–36.0)
MCV: 77.6 fL — ABNORMAL LOW (ref 78.0–100.0)
Platelets: 286 10*3/uL (ref 150–400)
RBC: 3.43 MIL/uL — ABNORMAL LOW (ref 3.87–5.11)
RDW: 16 % — ABNORMAL HIGH (ref 11.5–15.5)
WBC: 7.5 10*3/uL (ref 4.0–10.5)

## 2016-12-16 NOTE — Progress Notes (Signed)
Pt discharge instructions reviewed with pt. Pt verbalizes understanding. Pt belongings with pt. Pt's mom is driving her home. Pt discharged via wheelchair. Pt is not in distress.

## 2016-12-16 NOTE — Progress Notes (Signed)
Orthopedic Trauma Service Progress Note   Patient ID: Angela Harrison MRN: [id number] DOB/AGE: 27-Dec-1975 41 y.o.  Subjective:  Doing well Ready to go home Block has worn off Pain tolerable    Review of Systems  Constitutional: Negative for chills and fever.  Eyes: Negative for blurred vision and double vision.  Respiratory: Negative for shortness of breath and wheezing.   Cardiovascular: Negative for chest pain and palpitations.  Gastrointestinal: Negative for abdominal pain, nausea and vomiting.    Objective:   VITALS:   Vitals:   12/15/16 0900 12/15/16 1325 12/15/16 2100 12/16/16 0528  BP:  (!) 89/42 121/68 (!) 105/56  Pulse:  79 (!) 115 (!) 101  Resp:  18    Temp:  98.7 F (37.1 C) 98.9 F (37.2 C) 98.8 F (37.1 C)  TempSrc:  Axillary Oral Oral  SpO2: 100% 98% 100% 98%  Weight:      Height:        Estimated body mass index is 28.7 kg/m as calculated from the following:   Height as of this encounter:  (1.778 m).   Weight as of this encounter: 90.7 kg (200 lb).   Intake/Output      09/21 0701 - 09/22 0700 09/22 0701 - 09/23 0700   P.O. 480 240   IV Piggyback 0    Total Intake(mL/kg) 480 (5.3) 240 (2.6)   Net +480 +240        Urine Occurrence 5 x 1 x   Emesis Occurrence 1 x      LABS  Results for orders placed or performed during the hospital encounter of 12/14/16 (from the past 24 hour(s))  Basic metabolic panel     Status: Abnormal   Collection Time: 12/16/16  4:40 AM  Result Value Ref Range   Sodium 133 (L) 135 - 145 mmol/L   Potassium 3.6 3.5 - 5.1 mmol/L   Chloride 103 101 - 111 mmol/L   CO2 26 22 - 32 mmol/L   Glucose, Bld 112 (H) 65 - 99 mg/dL   BUN <5 (L) 6 - 20 mg/dL   Creatinine, Ser 6.30 0.44 - 1.00 mg/dL   Calcium 8.1 (L) 8.9 - 10.3 mg/dL   GFR calc non Af Amer >60 >60 mL/min   GFR calc Af Amer >60 >60 mL/min   Anion gap 4 (L) 5 - 15  CBC     Status: Abnormal   Collection Time: 12/16/16  4:40 AM   Result Value Ref Range   WBC 7.5 4.0 - 10.5 K/uL   RBC 3.43 (L) 3.87 - 5.11 MIL/uL   Hemoglobin 8.3 (L) 12.0 - 15.0 g/dL   HCT 16.0 (L) 10.9 - 32.3 %   MCV 77.6 (L) 78.0 - 100.0 fL   MCH 24.2 (L) 26.0 - 34.0 pg   MCHC 31.2 30.0 - 36.0 g/dL   RDW 55.7 (H) 32.2 - 02.5 %   Platelets 286 150 - 400 K/uL     PHYSICAL EXAM:   Gen: awake and alert, appears well, NAD Lungs: clear Cardiac: s1 and s2, RRR Abd: + BS, NTND Ext:       Left Lower Extremity   Dressing c/d/i   Dressing removed  All incisions look great   Scant drainage  No erythema or other signs of infection  DPN, SPN, TN sensory functions are intact  EHL, FHL, anterior tibialis, posterior tibialis, peroneals and gastrocsoleus complex motor functions are intact. Patient can perform a quad set as well as flex  her knee.  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 12/14/16 1610 12/14/16 1238    .  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 Wound care was reviewed with the patient as well  Continue with ice and elevation  Follow-up with orthopedics in 10-14 days  - Pain management:  Percocet and oxycodone  Patient does see Dr. Ethelene Hal for chronic pain issues. We will address her acute pain for the next 4-6 weeks and then transition care back over to him  - ABL anemia/Hemodynamics  Stable  - Medical issues   Home medications  - DVT/PE prophylaxis:  Lovenox 21 days  - ID:   Perioperative antibiotics completed  - Metabolic Bone Disease:  Vitamin d deficiency   Supplement  - Activity:  Touchdown weightbearing left leg  - FEN/GI prophylaxis/Foley/Lines:  Diet as tolerated   - Dispo:  Discharged today  Follow-up with orthopedics in 10-14 days    Mearl Latin, PA-C [facility] [phone] (P) [phone] (O) 12/16/2016, 10:03 AM

## 2016-12-16 NOTE — Discharge Summary (Signed)
Orthopaedic Trauma Service (OTS)  Patient ID: AOIFE BOLD MRN: [id number] DOB/AGE: 42-Dec-1977 42 y.o.  Admit date: 12/14/2016 Discharge date: 12/16/2016  Admission Diagnoses: Left distal tibia and fibula nonunion Ehlers-Danlos syndrome Arthritis Migraine headaches  Discharge Diagnoses:  Principal Problem:   Closed extra-articular fracture of distal end of tibia with nonunion, unspecified laterality Active Problems:   Ehlers-Danlos syndrome type III   Arthritis   Migraine   Vitamin D deficiency   Procedures Performed: 12/14/2016- Dr. Marcelino Scot 1. Intramedullary nailing of the left tibia using a Stryker 11 mm x     375 mm statically locked nail. 2. Nonunion repair, left fibular fracture. 3. Hardware removal, left tibia. 4. Hardware removal, left fibula.   Discharged Condition: good  Hospital Course:   Patient is a 41 year old female status post IM nailing left tibia January 2018 was referred to orthopedic trauma service for treatment of left tibia and fibula nonunion. Patient seen and evaluated as an outpatient. CT scan confirmed nonunion of her tibia and fibula. Patient was taken to the OR and on 12/14/2016. The procedures noted above were performed. Patient did receive a preoperative block. Patient tolerated procedure well. After surgery she was transferred to theorthopedic floor for observation and pain control. She also participated with therapies following surgery as well. Her hospital stay was really uncomplicated. Her block did last about a day and a half. I wonder bottle for off her pain was quite tolerable. She was adequately managed with oral pain medications. On postoperative day #1 patient was started on Lovenox for DVT and PE prophylaxis. She began to work with therapies for mobilizing. A CAM boot was ordered to allow her to be touchdown weightbearing on her operative side.No other issues were noted. Patient tolerating regular diet. She had a dressing change on  postoperative day #2. Her operative sites look fantastic. No evidence of infection. Clean dry dressing was replaced. She was placed into a cam boot and good was adjusted appropriately.    due to her nonunion a metabolic bone forearm is really only notable for vitamin D deficiency. Patient was started on vitamin D supplementation No additional findings are noted.   Patient discharged in stable condition on postoperative day #2, 12/16/2016.  Consults: None  Significant Diagnostic Studies: labs:  Results for MERRY, POND (MRN [id number]) as of 12/16/2016 13:18  Ref. Range 12/16/2016 04:40  Sodium Latest Ref Range: 135 - 145 mmol/L 133 (L)  Potassium Latest Ref Range: 3.5 - 5.1 mmol/L 3.6  Chloride Latest Ref Range: 101 - 111 mmol/L 103  CO2 Latest Ref Range: 22 - 32 mmol/L 26  Glucose Latest Ref Range: 65 - 99 mg/dL 112 (H)  BUN Latest Ref Range: 6 - 20 mg/dL <5 (L)  Creatinine Latest Ref Range: 0.44 - 1.00 mg/dL 0.83  Calcium Latest Ref Range: 8.9 - 10.3 mg/dL 8.1 (L)  Anion gap Latest Ref Range: 5 - 15  4 (L)  GFR, Est Non African American Latest Ref Range: >60 mL/min >60  GFR, Est African American Latest Ref Range: >60 mL/min >60  WBC Latest Ref Range: 4.0 - 10.5 K/uL 7.5  RBC Latest Ref Range: 3.87 - 5.11 MIL/uL 3.43 (L)  Hemoglobin Latest Ref Range: 12.0 - 15.0 g/dL 8.3 (L)  HCT Latest Ref Range: 36.0 - 46.0 % 26.6 (L)  MCV Latest Ref Range: 78.0 - 100.0 fL 77.6 (L)  MCH Latest Ref Range: 26.0 - 34.0 pg 24.2 (L)  MCHC Latest Ref Range: 30.0 - 36.0 g/dL 31.2  RDW Latest Ref Range: 11.5 - 15.5 % 16.0 (H)  Platelets Latest Ref Range: 150 - 400 K/uL 286    Results for SHONIKA, KOLASINSKI (MRN [id number]) as of 12/16/2016 13:18  Ref. Range 12/14/2016 09:43  CRP Latest Ref Range: <1.0 mg/dL <0.8  Vit D, 1,25-Dihydroxy Latest Ref Range: 19.9 - 79.3 pg/mL 47.1  Vitamin D, 25-Hydroxy Latest Ref Range: 30.0 - 100.0 ng/mL 19.6 (L)   Results for TEAIRRA, MILLAR (MRN [id number]) as of  12/16/2016 13:18  Ref. Range 12/14/2016 09:44  Hemoglobin A1C Latest Ref Range: 4.8 - 5.6 % 5.2    Treatments: IV hydration, antibiotics: clindamycin, analgesia: Percocet, OxyIR, Dilaudid, anticoagulation: LMW heparin, therapies: PT, OT and RN and surgery: as above  Discharge Exam:       Orthopedic Trauma Service Progress Note    Patient ID: AZAYA GOEDDE MRN: [id number] DOB/AGE: 18-Aug-1975 41 y.o.   Subjective:   Doing well Ready to go home Block has worn off Pain tolerable      Review of Systems  Constitutional: Negative for chills and fever.  Eyes: Negative for blurred vision and double vision.  Respiratory: Negative for shortness of breath and wheezing.   Cardiovascular: Negative for chest pain and palpitations.  Gastrointestinal: Negative for abdominal pain, nausea and vomiting.      Objective:    VITALS:         Vitals:    12/15/16 0900 12/15/16 1325 12/15/16 2100 12/16/16 0528  BP:   (!) 89/42 121/68 (!) 105/56  Pulse:   79 (!) 115 (!) 101  Resp:   18      Temp:   98.7 F (37.1 C) 98.9 F (37.2 C) 98.8 F (37.1 C)  TempSrc:   Axillary Oral Oral  SpO2: 100% 98% 100% 98%  Weight:          Height:              Estimated body mass index is 28.7 kg/m as calculated from the following:   Height as of this encounter: 5' 10" (1.778 m).   Weight as of this encounter: 90.7 kg (200 lb).     Intake/Output      09/21 0701 - 09/22 0700 09/22 0701 - 09/23 0700   P.O. 480 240   IV Piggyback 0    Total Intake(mL/kg) 480 (5.3) 240 (2.6)   Net +480 +240        Urine Occurrence 5 x 1 x   Emesis Occurrence 1 x       LABS   Lab Results Last 24 Hours       Results for orders placed or performed during the hospital encounter of 12/14/16 (from the past 24 hour(s))  Basic metabolic panel     Status: Abnormal    Collection Time: 12/16/16  4:40 AM  Result Value Ref Range    Sodium 133 (L) 135 - 145 mmol/L    Potassium 3.6 3.5 - 5.1 mmol/L    Chloride 103 101 -  111 mmol/L    CO2 26 22 - 32 mmol/L    Glucose, Bld 112 (H) 65 - 99 mg/dL    BUN <5 (L) 6 - 20 mg/dL    Creatinine, Ser 0.83 0.44 - 1.00 mg/dL    Calcium 8.1 (L) 8.9 - 10.3 mg/dL    GFR calc non Af Amer >60 >60 mL/min    GFR calc Af Amer >60 >60 mL/min    Anion gap 4 (L) 5 -  15  CBC     Status: Abnormal    Collection Time: 12/16/16  4:40 AM  Result Value Ref Range    WBC 7.5 4.0 - 10.5 K/uL    RBC 3.43 (L) 3.87 - 5.11 MIL/uL    Hemoglobin 8.3 (L) 12.0 - 15.0 g/dL    HCT 26.6 (L) 36.0 - 46.0 %    MCV 77.6 (L) 78.0 - 100.0 fL    MCH 24.2 (L) 26.0 - 34.0 pg    MCHC 31.2 30.0 - 36.0 g/dL    RDW 16.0 (H) 11.5 - 15.5 %    Platelets 286 150 - 400 K/uL          PHYSICAL EXAM:    Gen: awake and alert, appears well, NAD Lungs: clear Cardiac: s1 and s2, RRR Abd: + BS, NTND Ext:       Left Lower Extremity              Dressing c/d/i               Dressing removed             All incisions look great              Scant drainage             No erythema or other signs of infection             DPN, SPN, TN sensory functions are intact             EHL, FHL, anterior tibialis, posterior tibialis, peroneals and gastrocsoleus complex motor functions are intact. Patient can perform a quad set as well as flex her knee.             Extremity is warm             + DP pulse             Compartments are soft, no pain with passive stretching     Assessment/Plan: 2 Days Post-Op    Principal Problem:   Closed extra-articular fracture of distal end of tibia with nonunion, unspecified laterality Active Problems:   Ehlers-Danlos syndrome type III   Arthritis   Migraine               Anti-infectives     Start     Dose/Rate Route Frequency Ordered Stop    12/14/16 1830   clindamycin (CLEOCIN) IVPB 600 mg     600 mg 100 mL/hr over 30 Minutes Intravenous Every 6 hours 12/14/16 1608 12/15/16 1032    12/14/16 0900   clindamycin (CLEOCIN) IVPB 900 mg     900 mg 100 mL/hr over 30 Minutes  Intravenous On call to O.R. 12/14/16 8882 12/14/16 1238     .   POD/HD#: 3   41 year old female with nonunion of left distal tibia and fibula   -left distal tibia and fibula nonunion s/p exchange nailing of left tibia, removal of hardware left fibula/intramedullary screw fixation left fibula with allografting left fibula             Touchdown weightbearing left leg for approximately 4 weeks             Restricted range of motion left ankle and knee             Home exercise program has been reviewed with the patient             Dressing changes starting day after  patient returns home. Wound care was reviewed with the patient as well             Continue with ice and elevation             Follow-up with orthopedics in 10-14 days   - Pain management:             Percocet and oxycodone             Patient does see Dr. Nelva Bush for chronic pain issues. We will address her acute pain for the next 4-6 weeks and then transition care back over to him   - ABL anemia/Hemodynamics             Stable   - Medical issues              Home medications   - DVT/PE prophylaxis:             Lovenox 21 days   - ID:              Perioperative antibiotics completed   - Metabolic Bone Disease:             Vitamin d deficiency                         Supplement   - Activity:             Touchdown weightbearing left leg   - FEN/GI prophylaxis/Foley/Lines:             Diet as tolerated     - Dispo:             Discharged today             Follow-up with orthopedics in 10-14 days     Disposition: 01-Home or Self Care  Discharge Instructions    Call MD / Call 911    Complete by:  As directed    If you experience chest pain or shortness of breath, CALL 911 and be transported to the hospital emergency room.  If you develope a fever above 101 F, pus (white drainage) or increased drainage or redness at the wound, or calf pain, call your surgeon's office.   Constipation Prevention    Complete by:   As directed    Drink plenty of fluids.  Prune juice may be helpful.  You may use a stool softener, such as Colace (over the counter) 100 mg twice a day.  Use MiraLax (over the counter) for constipation as needed.   Diet general    Complete by:  As directed    Discharge instructions    Complete by:  As directed    Orthopaedic Trauma Service Discharge Instructions   General Discharge Instructions  WEIGHT BEARING STATUS: Touchdown weightbearing left leg in CAM boot (black boot)  RANGE OF MOTION/ACTIVITY: unrestricted range of motion left knee and ankle  Wound Care: daily wound care starting the day after you return home.  See below  Discharge Wound Care Instructions  Do NOT apply any ointments, solutions or lotions to pin sites or surgical wounds.  These prevent needed drainage and even though solutions like hydrogen peroxide kill bacteria, they also damage cells lining the pin sites that help fight infection.  Applying lotions or ointments can keep the wounds moist and can cause them to breakdown and open up as well. This can increase the risk for infection. When in doubt call the office.  Surgical incisions should be dressed daily.  If any drainage is noted, use one layer of adaptic/oil emulsion gauze, then 4x4 gauze, Kerlix, and an ace wrap (compression sock can be used instead of ace wrap).  Once the incision is completely dry and without drainage, it may be left open to air out.  Showering may begin 36-48 hours later.  Cleaning gently with soap and water.   DVT/PE prophylaxis: Lovenox x 21 days   Diet: as you were eating previously.  Can use over the counter stool softeners and bowel preparations, such as Miralax, to help with bowel movements.  Narcotics can be constipating.  Be sure to drink plenty of fluids  PAIN MEDICATION USE AND EXPECTATIONS  You have likely been given narcotic medications to help control your pain.  After a traumatic event that results in an fracture  (broken bone) with or without surgery, it is ok to use narcotic pain medications to help control one's pain.  We understand that everyone responds to pain differently and each individual patient will be evaluated on a regular basis for the continued need for narcotic medications. Ideally, narcotic medication use should last no more than 6-8 weeks (coinciding with fracture healing).   As a patient it is your responsibility as well to monitor narcotic medication use and report the amount and frequency you use these medications when you come to your office visit.   We would also advise that if you are using narcotic medications, you should take a dose prior to therapy to maximize you participation.  IF YOU ARE ON NARCOTIC MEDICATIONS IT IS NOT PERMISSIBLE TO OPERATE A MOTOR VEHICLE (MOTORCYCLE/CAR/TRUCK/MOPED) OR HEAVY MACHINERY DO NOT MIX NARCOTICS WITH OTHER CNS (CENTRAL NERVOUS SYSTEM) DEPRESSANTS SUCH AS ALCOHOL   STOP SMOKING OR USING NICOTINE PRODUCTS!!!!  As discussed nicotine severely impairs your body's ability to heal surgical and traumatic wounds but also impairs bone healing.  Wounds and bone heal by forming microscopic blood vessels (angiogenesis) and nicotine is a vasoconstrictor (essentially, shrinks blood vessels).  Therefore, if vasoconstriction occurs to these microscopic blood vessels they essentially disappear and are unable to deliver necessary nutrients to the healing tissue.  This is one modifiable factor that you can do to dramatically increase your chances of healing your injury.    (This means no smoking, no nicotine gum, patches, etc)  DO NOT USE NONSTEROIDAL ANTI-INFLAMMATORY DRUGS (NSAID'S)  Using products such as Advil (ibuprofen), Aleve (naproxen), Motrin (ibuprofen) for additional pain control during fracture healing can delay and/or prevent the healing response.  If you would like to take over the counter (OTC) medication, Tylenol (acetaminophen) is ok.  However, some  narcotic medications that are given for pain control contain acetaminophen as well. Therefore, you should not exceed more than 4000 mg of tylenol in a day if you do not have liver disease.  Also note that there are may OTC medicines, such as cold medicines and allergy medicines that my contain tylenol as well.  If you have any questions about medications and/or interactions please ask your doctor/PA or your pharmacist.      ICE AND ELEVATE INJURED/OPERATIVE EXTREMITY  Using ice and elevating the injured extremity above your heart can help with swelling and pain control.  Icing in a pulsatile fashion, such as 20 minutes on and 20 minutes off, can be followed.    Do not place ice directly on skin. Make sure there is a barrier between to skin and the ice pack.    Using  frozen items such as frozen peas works well as the conform nicely to the are that needs to be iced.  USE AN ACE WRAP OR TED HOSE FOR SWELLING CONTROL  In addition to icing and elevation, Ace wraps or TED hose are used to help limit and resolve swelling.  It is recommended to use Ace wraps or TED hose until you are informed to stop.    When using Ace Wraps start the wrapping distally (farthest away from the body) and wrap proximally (closer to the body)   Example: If you had surgery on your leg or thing and you do not have a splint on, start the ace wrap at the toes and work your way up to the thigh        If you had surgery on your upper extremity and do not have a splint on, start the ace wrap at your fingers and work your way up to the upper arm  IF YOU ARE IN A SPLINT OR CAST DO NOT Placer   If your splint gets wet for any reason please contact the office immediately. You may shower in your splint or cast as long as you keep it dry.  This can be done by wrapping in a cast cover or garbage back (or similar)  Do Not stick any thing down your splint or cast such as pencils, money, or hangers to try and scratch yourself  with.  If you feel itchy take benadryl as prescribed on the bottle for itching  IF YOU ARE IN A CAM BOOT (BLACK BOOT)  You may remove boot periodically. Perform daily dressing changes as noted below.  Wash the liner of the boot regularly and wear a sock when wearing the boot. It is recommended that you sleep in the boot until told otherwise  CALL THE OFFICE WITH ANY QUESTIONS OR CONCERNS: [phone]   Driving restrictions    Complete by:  As directed    No driving   Increase activity slowly as tolerated    Complete by:  As directed      Allergies as of 12/16/2016      Reactions   Cephalosporins Hives, Swelling, Rash, Other (See Comments)   Can tolerate Penicillin   Cefazolin Hives, Swelling, Rash, Other (See Comments)   Can tolerate Penicillin   Cephalexin Hives, Swelling, Rash, Other (See Comments)   Can tolerate Penicillin      Medication List    STOP taking these medications   acetaminophen 500 MG tablet Commonly known as:  TYLENOL   HYDROcodone-acetaminophen 5-325 MG tablet Commonly known as:  NORCO/VICODIN     TAKE these medications   docusate sodium 100 MG capsule Commonly known as:  COLACE Take 1 capsule (100 mg total) by mouth 2 (two) times daily.   enoxaparin 40 MG/0.4ML injection Commonly known as:  LOVENOX Inject 0.4 mLs (40 mg total) into the skin daily.   fluocinonide 0.05 % external solution Commonly known as:  LIDEX APPLY TO AFFECTED AREAS AS NEEDED FOR PSORIASIS   IMITREX 6 MG/0.5ML Soln injection Generic drug:  SUMAtriptan Inject 6 mg into the skin every 2 (two) hours as needed for migraine or headache. May repeat in 2 hours if headache persists or recurs.   ketoconazole 2 % shampoo Commonly known as:  NIZORAL APPLY ON SKIN AS DIRECTED,LATHER AND LET SIT FOR FEW MINUTES PRIOR TO RINSING WHEN YOU SHOWER   meclizine 25 MG tablet Commonly known as:  ANTIVERT Take 25  mg by mouth 3 (three) times daily as needed for dizziness.   methocarbamol 750  MG tablet Commonly known as:  ROBAXIN TAKE 1 TABLET BY MOUTH TWICE A DAY AS NEEDED FOR SPASM What changed:  Another medication with the same name was added. Make sure you understand how and when to take each.   methocarbamol 500 MG tablet Commonly known as:  ROBAXIN Take 1-2 tablets (500-1,000 mg total) by mouth every 6 (six) hours as needed for muscle spasms. What changed:  You were already taking a medication with the same name, and this prescription was added. Make sure you understand how and when to take each.   metoprolol succinate 50 MG 24 hr tablet Commonly known as:  TOPROL-XL TAKE 1 TABLET (50 MG TOTAL) BY MOUTH EVERY EVENING.   ondansetron 8 MG tablet Commonly known as:  ZOFRAN Take 8 mg by mouth every 8 (eight) hours as needed for nausea or vomiting.   oxyCODONE 5 MG immediate release tablet Commonly known as:  Oxy IR/ROXICODONE Take 1-2 tablets (5-10 mg total) by mouth every 6 (six) hours as needed for breakthrough pain (take between percocet for breakthrough pain).   oxyCODONE-acetaminophen 5-325 MG tablet Commonly known as:  PERCOCET/ROXICET Take 1-2 tablets by mouth every 6 (six) hours as needed for moderate pain or severe pain.   polyethylene glycol packet Commonly known as:  MIRALAX / GLYCOLAX Take 17 g by mouth daily.   promethazine 25 MG suppository Commonly known as:  PHENERGAN Place 25 mg rectally every 6 (six) hours as needed for nausea or vomiting.   sertraline 50 MG tablet Commonly known as:  ZOLOFT TAKE 1 TABLET (50 MG TOTAL) BY MOUTH IN THE EVENING     ASK your doctor about these medications   enoxaparin Kit Commonly known as:  LOVENOX 1 kit by Does not apply route once. Ask about: Should I take this medication?            Durable Medical Equipment        Start     Ordered   12/15/16 1104  For home use only DME Crutches  Once     12/15/16 1103       Discharge Care Instructions        Start     Ordered   12/16/16 0000   enoxaparin (LOVENOX) 40 MG/0.4ML injection  Every 24 hours    Question:  Supervising Provider  Answer:  HANDY, MICHAEL   12/15/16 0943   12/15/16 0000  docusate sodium (COLACE) 100 MG capsule  2 times daily    Question:  Supervising Provider  Answer:  Altamese Hamberg   12/15/16 0943   12/15/16 0000  methocarbamol (ROBAXIN) 500 MG tablet  Every 6 hours PRN    Question:  Supervising Provider  Answer:  Altamese    12/15/16 0943   12/15/16 0000  oxyCODONE (OXY IR/ROXICODONE) 5 MG immediate release tablet  Every 6 hours PRN    Question:  Supervising Provider  Answer:  Altamese    12/15/16 0943   12/15/16 0000  oxyCODONE-acetaminophen (PERCOCET/ROXICET) 5-325 MG tablet  Every 6 hours PRN    Question:  Supervising Provider  Answer:  HANDY, MICHAEL   12/15/16 0943   12/15/16 0000  polyethylene glycol (MIRALAX / Floria Raveling) packet  Daily    Question:  Supervising Provider  Answer:  HANDY, MICHAEL   12/15/16 0943   12/15/16 0000  enoxaparin (LOVENOX) KIT   Once    Question:  Supervising Provider  AnswerAltamese Sebeka   12/15/16 9163   12/15/16 0000  Call MD / Call 911    Comments:  If you experience chest pain or shortness of breath, CALL 911 and be transported to the hospital emergency room.  If you develope a fever above 101 F, pus (white drainage) or increased drainage or redness at the wound, or calf pain, call your surgeon's office.   12/15/16 0949   12/15/16 0000  Constipation Prevention    Comments:  Drink plenty of fluids.  Prune juice may be helpful.  You may use a stool softener, such as Colace (over the counter) 100 mg twice a day.  Use MiraLax (over the counter) for constipation as needed.   12/15/16 0949   12/15/16 0000  Increase activity slowly as tolerated     12/15/16 0949   12/15/16 0000  Discharge instructions    Comments:  Orthopaedic Trauma Service Discharge Instructions   General Discharge Instructions  WEIGHT BEARING STATUS: Touchdown weightbearing left leg in  CAM boot (black boot)  RANGE OF MOTION/ACTIVITY: unrestricted range of motion left knee and ankle  Wound Care: daily wound care starting the day after you return home.  See below  Discharge Wound Care Instructions  Do NOT apply any ointments, solutions or lotions to pin sites or surgical wounds.  These prevent needed drainage and even though solutions like hydrogen peroxide kill bacteria, they also damage cells lining the pin sites that help fight infection.  Applying lotions or ointments can keep the wounds moist and can cause them to breakdown and open up as well. This can increase the risk for infection. When in doubt call the office.  Surgical incisions should be dressed daily.  If any drainage is noted, use one layer of adaptic/oil emulsion gauze, then 4x4 gauze, Kerlix, and an ace wrap (compression sock can be used instead of ace wrap).  Once the incision is completely dry and without drainage, it may be left open to air out.  Showering may begin 36-48 hours later.  Cleaning gently with soap and water.   DVT/PE prophylaxis: Lovenox x 21 days   Diet: as you were eating previously.  Can use over the counter stool softeners and bowel preparations, such as Miralax, to help with bowel movements.  Narcotics can be constipating.  Be sure to drink plenty of fluids  PAIN MEDICATION USE AND EXPECTATIONS  You have likely been given narcotic medications to help control your pain.  After a traumatic event that results in an fracture (broken bone) with or without surgery, it is ok to use narcotic pain medications to help control one's pain.  We understand that everyone responds to pain differently and each individual patient will be evaluated on a regular basis for the continued need for narcotic medications. Ideally, narcotic medication use should last no more than 6-8 weeks (coinciding with fracture healing).   As a patient it is your responsibility as well to monitor narcotic medication use and  report the amount and frequency you use these medications when you come to your office visit.   We would also advise that if you are using narcotic medications, you should take a dose prior to therapy to maximize you participation.  IF YOU ARE ON NARCOTIC MEDICATIONS IT IS NOT PERMISSIBLE TO OPERATE A MOTOR VEHICLE (MOTORCYCLE/CAR/TRUCK/MOPED) OR HEAVY MACHINERY DO NOT MIX NARCOTICS WITH OTHER CNS (CENTRAL NERVOUS SYSTEM) DEPRESSANTS SUCH AS ALCOHOL   STOP SMOKING OR USING NICOTINE PRODUCTS!!!!  As discussed nicotine severely  impairs your body's ability to heal surgical and traumatic wounds but also impairs bone healing.  Wounds and bone heal by forming microscopic blood vessels (angiogenesis) and nicotine is a vasoconstrictor (essentially, shrinks blood vessels).  Therefore, if vasoconstriction occurs to these microscopic blood vessels they essentially disappear and are unable to deliver necessary nutrients to the healing tissue.  This is one modifiable factor that you can do to dramatically increase your chances of healing your injury.    (This means no smoking, no nicotine gum, patches, etc)  DO NOT USE NONSTEROIDAL ANTI-INFLAMMATORY DRUGS (NSAID'S)  Using products such as Advil (ibuprofen), Aleve (naproxen), Motrin (ibuprofen) for additional pain control during fracture healing can delay and/or prevent the healing response.  If you would like to take over the counter (OTC) medication, Tylenol (acetaminophen) is ok.  However, some narcotic medications that are given for pain control contain acetaminophen as well. Therefore, you should not exceed more than 4000 mg of tylenol in a day if you do not have liver disease.  Also note that there are may OTC medicines, such as cold medicines and allergy medicines that my contain tylenol as well.  If you have any questions about medications and/or interactions please ask your doctor/PA or your pharmacist.      ICE AND ELEVATE INJURED/OPERATIVE  EXTREMITY  Using ice and elevating the injured extremity above your heart can help with swelling and pain control.  Icing in a pulsatile fashion, such as 20 minutes on and 20 minutes off, can be followed.    Do not place ice directly on skin. Make sure there is a barrier between to skin and the ice pack.    Using frozen items such as frozen peas works well as the conform nicely to the are that needs to be iced.  USE AN ACE WRAP OR TED HOSE FOR SWELLING CONTROL  In addition to icing and elevation, Ace wraps or TED hose are used to help limit and resolve swelling.  It is recommended to use Ace wraps or TED hose until you are informed to stop.    When using Ace Wraps start the wrapping distally (farthest away from the body) and wrap proximally (closer to the body)   Example: If you had surgery on your leg or thing and you do not have a splint on, start the ace wrap at the toes and work your way up to the thigh        If you had surgery on your upper extremity and do not have a splint on, start the ace wrap at your fingers and work your way up to the upper arm  IF YOU ARE IN A SPLINT OR CAST DO NOT [place]   If your splint gets wet for any reason please contact the office immediately. You may shower in your splint or cast as long as you keep it dry.  This can be done by wrapping in a cast cover or garbage back (or similar)  Do Not stick any thing down your splint or cast such as pencils, money, or hangers to try and scratch yourself with.  If you feel itchy take benadryl as prescribed on the bottle for itching  IF YOU ARE IN A CAM BOOT (BLACK BOOT)  You may remove boot periodically. Perform daily dressing changes as noted below.  Wash the liner of the boot regularly and wear a sock when wearing the boot. It is recommended that you sleep in the boot until  told otherwise  CALL THE OFFICE WITH ANY QUESTIONS OR CONCERNS: [phone]   12/15/16 0949   12/15/16 0000  Diet general      12/15/16 0949   12/15/16 0000  Driving restrictions    Comments:  No driving   62/86/38 1771     Follow-up Information    Altamese La Grulla, MD. Schedule an appointment as soon as possible for a visit in 10 day(s).   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 Wound care was reviewed with the patient as well             Continue with ice and elevation             Follow-up with orthopedics in 10-14 days   - Pain management:             Percocet and oxycodone             Patient does see Dr. Nelva Bush for chronic pain issues. We will address her acute pain for the next 4-6 weeks and then transition care back over to him   - ABL anemia/Hemodynamics             Stable   - Medical issues              Home medications   - DVT/PE prophylaxis:             Lovenox 21 days   - ID:              Perioperative antibiotics completed   - Metabolic Bone Disease:             Vitamin d deficiency                         Supplement   - Activity:             Touchdown weightbearing left leg   - FEN/GI prophylaxis/Foley/Lines:             Diet as tolerated     - Dispo:             Discharged today             Follow-up with orthopedics in 10-14 days     Signed:  Jari Pigg, PA-C [facility] [phone] (P) 12/16/2016, 1:24 PM

## 2016-12-17 LAB — NICOTINE/COTININE METABOLITES
Cotinine: NOT DETECTED ng/mL
Nicotine: NOT DETECTED ng/mL

## 2016-12-18 ENCOUNTER — Encounter (HOSPITAL_COMMUNITY): Payer: Self-pay | Admitting: Orthopedic Surgery

## 2016-12-19 LAB — AEROBIC/ANAEROBIC CULTURE W GRAM STAIN (SURGICAL/DEEP WOUND)

## 2016-12-25 DIAGNOSIS — S82232K Displaced oblique fracture of shaft of left tibia, subsequent encounter for closed fracture with nonunion: Secondary | ICD-10-CM | POA: Diagnosis not present

## 2016-12-25 DIAGNOSIS — S82432K Displaced oblique fracture of shaft of left fibula, subsequent encounter for closed fracture with nonunion: Secondary | ICD-10-CM | POA: Diagnosis not present

## 2017-01-10 DIAGNOSIS — R55 Syncope and collapse: Secondary | ICD-10-CM | POA: Diagnosis not present

## 2017-01-10 DIAGNOSIS — Z6828 Body mass index (BMI) 28.0-28.9, adult: Secondary | ICD-10-CM | POA: Diagnosis not present

## 2017-01-10 DIAGNOSIS — R000 Tachycardia, unspecified: Secondary | ICD-10-CM | POA: Diagnosis not present

## 2017-01-10 DIAGNOSIS — I951 Orthostatic hypotension: Secondary | ICD-10-CM | POA: Diagnosis not present

## 2017-01-12 DIAGNOSIS — S82202A Unspecified fracture of shaft of left tibia, initial encounter for closed fracture: Secondary | ICD-10-CM | POA: Diagnosis not present

## 2017-01-15 DIAGNOSIS — S82432K Displaced oblique fracture of shaft of left fibula, subsequent encounter for closed fracture with nonunion: Secondary | ICD-10-CM | POA: Diagnosis not present

## 2017-01-15 DIAGNOSIS — S82232K Displaced oblique fracture of shaft of left tibia, subsequent encounter for closed fracture with nonunion: Secondary | ICD-10-CM | POA: Diagnosis not present

## 2017-02-12 DIAGNOSIS — S82432K Displaced oblique fracture of shaft of left fibula, subsequent encounter for closed fracture with nonunion: Secondary | ICD-10-CM | POA: Diagnosis not present

## 2017-02-12 DIAGNOSIS — S82232K Displaced oblique fracture of shaft of left tibia, subsequent encounter for closed fracture with nonunion: Secondary | ICD-10-CM | POA: Diagnosis not present

## 2017-03-12 DIAGNOSIS — G894 Chronic pain syndrome: Secondary | ICD-10-CM | POA: Diagnosis not present

## 2017-04-02 DIAGNOSIS — S82232K Displaced oblique fracture of shaft of left tibia, subsequent encounter for closed fracture with nonunion: Secondary | ICD-10-CM | POA: Diagnosis not present

## 2017-04-02 DIAGNOSIS — S82432K Displaced oblique fracture of shaft of left fibula, subsequent encounter for closed fracture with nonunion: Secondary | ICD-10-CM | POA: Diagnosis not present

## 2017-04-02 DIAGNOSIS — M79605 Pain in left leg: Secondary | ICD-10-CM | POA: Diagnosis not present

## 2017-04-02 DIAGNOSIS — M25572 Pain in left ankle and joints of left foot: Secondary | ICD-10-CM | POA: Diagnosis not present

## 2017-04-10 DIAGNOSIS — M25572 Pain in left ankle and joints of left foot: Secondary | ICD-10-CM | POA: Diagnosis not present

## 2017-04-12 DIAGNOSIS — M25572 Pain in left ankle and joints of left foot: Secondary | ICD-10-CM | POA: Diagnosis not present

## 2017-04-17 DIAGNOSIS — M25572 Pain in left ankle and joints of left foot: Secondary | ICD-10-CM | POA: Diagnosis not present

## 2017-04-24 DIAGNOSIS — M25572 Pain in left ankle and joints of left foot: Secondary | ICD-10-CM | POA: Diagnosis not present

## 2017-04-26 DIAGNOSIS — M25572 Pain in left ankle and joints of left foot: Secondary | ICD-10-CM | POA: Diagnosis not present

## 2017-05-01 DIAGNOSIS — M25572 Pain in left ankle and joints of left foot: Secondary | ICD-10-CM | POA: Diagnosis not present

## 2017-05-03 DIAGNOSIS — M25572 Pain in left ankle and joints of left foot: Secondary | ICD-10-CM | POA: Diagnosis not present

## 2017-05-14 DIAGNOSIS — M25572 Pain in left ankle and joints of left foot: Secondary | ICD-10-CM | POA: Diagnosis not present

## 2017-05-16 DIAGNOSIS — Z79891 Long term (current) use of opiate analgesic: Secondary | ICD-10-CM | POA: Diagnosis not present

## 2017-05-16 DIAGNOSIS — G894 Chronic pain syndrome: Secondary | ICD-10-CM | POA: Diagnosis not present

## 2017-05-16 DIAGNOSIS — M62830 Muscle spasm of back: Secondary | ICD-10-CM | POA: Diagnosis not present

## 2017-05-22 DIAGNOSIS — M25572 Pain in left ankle and joints of left foot: Secondary | ICD-10-CM | POA: Diagnosis not present

## 2017-05-24 DIAGNOSIS — M1611 Unilateral primary osteoarthritis, right hip: Secondary | ICD-10-CM | POA: Diagnosis not present

## 2017-05-29 DIAGNOSIS — M25572 Pain in left ankle and joints of left foot: Secondary | ICD-10-CM | POA: Diagnosis not present

## 2017-05-30 DIAGNOSIS — M1611 Unilateral primary osteoarthritis, right hip: Secondary | ICD-10-CM | POA: Diagnosis not present

## 2017-05-31 DIAGNOSIS — J069 Acute upper respiratory infection, unspecified: Secondary | ICD-10-CM | POA: Diagnosis not present

## 2017-07-04 DIAGNOSIS — T8484XA Pain due to internal orthopedic prosthetic devices, implants and grafts, initial encounter: Secondary | ICD-10-CM | POA: Diagnosis not present

## 2017-07-04 DIAGNOSIS — M1612 Unilateral primary osteoarthritis, left hip: Secondary | ICD-10-CM | POA: Diagnosis not present

## 2017-08-13 DIAGNOSIS — G894 Chronic pain syndrome: Secondary | ICD-10-CM | POA: Diagnosis not present

## 2017-08-22 ENCOUNTER — Ambulatory Visit (INDEPENDENT_AMBULATORY_CARE_PROVIDER_SITE_OTHER): Payer: BLUE CROSS/BLUE SHIELD | Admitting: Orthopaedic Surgery

## 2017-08-22 ENCOUNTER — Encounter (INDEPENDENT_AMBULATORY_CARE_PROVIDER_SITE_OTHER): Payer: Self-pay | Admitting: Orthopaedic Surgery

## 2017-08-22 ENCOUNTER — Ambulatory Visit (INDEPENDENT_AMBULATORY_CARE_PROVIDER_SITE_OTHER): Payer: BLUE CROSS/BLUE SHIELD

## 2017-08-22 DIAGNOSIS — M1611 Unilateral primary osteoarthritis, right hip: Secondary | ICD-10-CM | POA: Diagnosis not present

## 2017-08-22 DIAGNOSIS — M25551 Pain in right hip: Secondary | ICD-10-CM | POA: Diagnosis not present

## 2017-08-22 NOTE — Progress Notes (Signed)
Office Visit Note   Patient: Angela Harrison           Date of Birth: 12-23-1975           MRN: [id number] Visit Date: 08/22/2017              Requested by: Farris Has, MD [address] [place], [place] [address] PCP: Farris Has, MD   Assessment & Plan: Visit Diagnoses:  1. Pain in right hip   2. Unilateral primary osteoarthritis, right hip     Plan: Patient is been having rapid progression of her hip disease in the right hip and I do feel that the last treatment option at this point is a hip replacement.  She understands this fully as well having had this done on the left side before.  We long and thorough discussion about the risk and benefits of the surgery.  Again she is failed all conservative treatment options at this point and given the complaint of osteonecrosis this is not going to get better.  She is already worked on activity modification as well as weight loss and anti-inflammatories and injections.  She is work on strengthening her hip muscles.  We will work on getting the surgery set up sometime in late June hopefully.  And this works with her schedule as well.  All question concerns were answered and addressed.  She is in chronic pain management and we will take over pain management in the immediate postoperative.  She understands this as well.  Follow-Up Instructions: Return for 2 weeks post-op.   Orders:  Orders Placed This Encounter  Procedures  . XR HIP UNILAT W OR W/O PELVIS 1V RIGHT   No orders of the defined types were placed in this encounter.     Procedures: No procedures performed   Clinical Data: No additional findings.   Subjective: Chief Complaint  Patient presents with  . Right Hip - Pain  Patient is a very pleasant 42 year old female with right hip pain for over a year now.  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  She has had significant surgeries on her shoulders and is actually had a  left total hip arthroplasty done in 2017.  Her right hip has been doing well but then she unfortunately suffered a hip dislocation.  This was due to high impact activities and with a combination of her connective tissue disorder and ligamentous laxity the hip subluxed and then was reduced.  She then developed progressively worsening hip pain.  Saw in her groin and around her hip.  She is tried and failed all forms of conservative treatment measures including injections and anti-inflammatories and pain management.  She is work on activity modification as well.  This is gotten rapidly worse over the last several months.  It is now 10 out of 10 pain.  It is detrimentally affected her activity living, quality of life, and her mobility.  She currently denies any headache, chest pain, shortness of breath, fever, chills, nausea, vomiting.  HPI  Review of Systems She currently denies any active medical issues other than her connective tissue disorder.  She is not a diabetic.  Objective: Vital Signs: There were no vitals taken for this visit.  Physical Exam She is alert and oriented x3 and in no acute distress examination of her left hip shows severe pain with any attempts of internal or external rotation Ortho Exam But I can put her through full range of motion  due to her laxity of the soft tissue. Specialty Comments:  No specialty comments available.  Imaging: Xr Hip Unilat W Or W/o Pelvis 1v Right  Result Date: 08/22/2017 X-rays of her right hip today are seen and compared to films from 2016.  She is had rapid progression and worsening of hip disease.  There is complete loss of superior lateral aspect of her hip.  There is sclerotic and cystic changes.  This is a combination of osteoarthritis and osteonecrosis.    PMFS History: Patient Active Problem List   Diagnosis Date Noted  . Vitamin D deficiency 12/16/2016  . Ehlers-Danlos syndrome type III   . Arthritis   . Migraine   . Closed  extra-articular fracture of distal end of tibia with nonunion, unspecified laterality 12/14/2016  . Closed left tibial fracture 04/11/2016  . Fracture tibia/fibula, left, closed, initial encounter 04/11/2016  . Obese 08/25/2015  . S/P left THA, AA 08/24/2015  . PANIC DISORDER WITHOUT AGORAPHOBIA 09/24/2007  . MIGRAINE HEADACHE 09/24/2007  . 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 DYSPNEA 09/24/2007   Past Medical History:  Diagnosis Date  . Anemia    PMH  . Arthritis    oa  . 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 Ehlers-Danlos syndrome type III   . Gastric paresis   . Migraine    migraines  . Syncope    neurocardiogenic takes toprol and zoloft for  . Vitamin D deficiency 12/16/2016    Family History  Problem Relation Age of Onset  . Breast cancer Mother   . Migraines Mother   . Hypertension Father   . Migraines Father   . Migraines Brother   . Migraines Other     Past Surgical History:  Procedure Laterality Date  . CHOLECYSTECTOMY    . ESOPHAGOGASTRODUODENOSCOPY ENDOSCOPY     x 2 or 3  . FIBULA FRACTURE SURGERY Left 12/14/2016  . HARDWARE REMOVAL Left 12/14/2016   Procedure: HARDWARE REMOVAL LEFT TIBIA;  Surgeon: Myrene Galas, MD;  Location: [facility] OR;  Service: Orthopedics;  Laterality: Left;  . IM NAILING TIBIA Left 12/14/2016  . KNEE SURGERY Bilateral    2 on each knee  . MAXILLARY ANTROSTOMY    . ORIF ANKLE FRACTURE Left 04/12/2016   Procedure: OPEN REDUCTION INTERNAL FIXATION (ORIF) ANKLE LEFT;  Surgeon: Yolonda Kida, MD;  Location[facility] OR;  Service: Orthopedics;  Laterality: Left;  . ORIF FIBULA FRACTURE Left 12/14/2016   Procedure: NON-UNION REPAIR LEFT FIBULA FRACTURE;  Surgeon: Myrene Galas, MD;  Location[facility] OR;  Service: Orthopedics;  Laterality: Left;  . SHOULDER SURGERY Left    x 4  . TIBIA HARDWARE REMOVAL Left 12/14/2016  . TIBIA IM NAIL INSERTION Left 04/12/2016   Procedure: INTRAMEDULLARY (IM) NAIL TIBIAL  LEFT;  Surgeon: Yolonda Kida, MD;  Location: [facility] OR;  Service: Orthopedics;  Laterality: Left;  . TIBIA IM NAIL INSERTION Left 12/14/2016   Procedure: INTRAMEDULLARY (IM) NAIL LEFT TIBIAL;  Surgeon: Myrene Galas, MD;  Location: [facility] OR;  Service: Orthopedics;  Laterality: Left;  . TONSILLECTOMY     adenoids also  . TOTAL HIP ARTHROPLASTY Left 08/24/2015   Procedure: LEFT TOTAL HIP ARTHROPLASTY ANTERIOR APPROACH;  Surgeon: Durene Romans, MD;  Location: [facility] ORS;  Service: Orthopedics;  Laterality: Left;   Social History   Occupational History  . Not on file  Tobacco Use  . Smoking status: Never Smoker  . Smokeless tobacco: Never Used  Substance and Sexual Activity  . Alcohol use: Yes    Comment: Socially   . Drug use: No  . Sexual activity: Not on file

## 2017-09-05 ENCOUNTER — Other Ambulatory Visit (INDEPENDENT_AMBULATORY_CARE_PROVIDER_SITE_OTHER): Payer: Self-pay

## 2017-09-11 ENCOUNTER — Other Ambulatory Visit (INDEPENDENT_AMBULATORY_CARE_PROVIDER_SITE_OTHER): Payer: Self-pay | Admitting: Physician Assistant

## 2017-09-14 NOTE — Progress Notes (Signed)
12-14-16 (Epic) EKG, CXR

## 2017-09-14 NOTE — Patient Instructions (Addendum)
Angela Harrison  09/14/2017   Your procedure is scheduled on: 09-21-17   Report to [facility] Main  Entrance    Report to Admitting at 12:00 PM    Call this number if you have problems the morning of surgery [phone]   Remember: Do not eat food or drink liquids :After Midnight. You may have a Clear Liquid Diet from Midnight until 8:30 AM. After 8:30 AM, nothing until after surgery.     CLEAR LIQUID DIET   Foods Allowed                                                                     Foods Excluded  Coffee and tea, regular and decaf                             liquids that you cannot  Plain Jell-O in any flavor                                             see through such as: Fruit ices (not with fruit pulp)                                     milk, soups, orange juice  Iced Popsicles                                    All solid food Carbonated beverages, regular and diet                                    Cranberry, grape and apple juices Sports drinks like Gatorade Lightly seasoned clear broth or consume(fat free) Sugar, honey syrup  Sample Menu Breakfast                                Lunch                                     Supper Cranberry juice                    Beef broth                            Chicken broth Jell-O                                     Grape juice  Apple juice Coffee or tea                        Jell-O                                      Popsicle                                                Coffee or tea                        Coffee or tea  _____________________________________________________________________     Take these medicines the morning of surgery with A SIP OF WATER: You may take your prn Vicodin if needed.                                  You may not have any metal on your body including hair pins and              piercings  Do not wear jewelry, make-up, lotions, powders  or perfumes, deodorant             Do not wear nail polish.  Do not shave  48 hours prior to surgery.                 Do not bring valuables to the hospital. [facility] IS NOT             RESPONSIBLE   FOR VALUABLES.  Contacts, dentures or bridgework may not be worn into surgery.  Leave suitcase in the car. After surgery it may be brought to your room.     Special Instructions: N/A              Please read over the following fact sheets you were given: _____________________________________________________________________          [facility] - Preparing for Surgery Before surgery, you can play an important role.  Because skin is not sterile, your skin needs to be as free of germs as possible.  You can reduce the number of germs on your skin by washing with CHG (chlorahexidine gluconate) soap before surgery.  CHG is an antiseptic cleaner which kills germs and bonds with the skin to continue killing germs even after washing. Please DO NOT use if you have an allergy to CHG or antibacterial soaps.  If your skin becomes reddened/irritated stop using the CHG and inform your nurse when you arrive at Short Stay. Do not shave (including legs and underarms) for at least 48 hours prior to the first CHG shower.  You may shave your face/neck. Please follow these instructions carefully:  1.  Shower with CHG Soap the night before surgery and the  morning of Surgery.  2.  If you choose to wash your hair, wash your hair first as usual with your  normal  shampoo.  3.  After you shampoo, rinse your hair and body thoroughly to remove the  shampoo.                           4.  Use CHG as  you would any other liquid soap.  You can apply chg directly  to the skin and wash                       Gently with a scrungie or clean washcloth.  5.  Apply the CHG Soap to your body ONLY FROM THE NECK DOWN.   Do not use on face/ open                           Wound or open sores. Avoid contact with eyes, ears mouth and  genitals (private parts).                       Wash face,  Genitals (private parts) with your normal soap.             6.  Wash thoroughly, paying special attention to the area where your surgery  will be performed.  7.  Thoroughly rinse your body with warm water from the neck down.  8.  DO NOT shower/wash with your normal soap after using and rinsing off  the CHG Soap.                9.  Pat yourself dry with a clean towel.            10.  Wear clean pajamas.            11.  Place clean sheets on your bed the night of your first shower and do not  sleep with pets. Day of Surgery : Do not apply any lotions/deodorants the morning of surgery.  Please wear clean clothes to the hospital/surgery center.  FAILURE TO FOLLOW THESE INSTRUCTIONS MAY RESULT IN THE CANCELLATION OF YOUR SURGERY PATIENT SIGNATURE_________________________________  NURSE SIGNATURE__________________________________  ________________________________________________________________________   Adam Phenix  An incentive spirometer is a tool that can help keep your lungs clear and active. This tool measures how well you are filling your lungs with each breath. Taking long deep breaths may help reverse or decrease the chance of developing breathing (pulmonary) problems (especially infection) following:  A long period of time when you are unable to move or be active. BEFORE THE PROCEDURE   If the spirometer includes an indicator to show your best effort, your nurse or respiratory therapist will set it to a desired goal.  If possible, sit up straight or lean slightly forward. Try not to slouch.  Hold the incentive spirometer in an upright position. INSTRUCTIONS FOR USE  1. Sit on the edge of your bed if possible, or sit up as far as you can in bed or on a chair. 2. Hold the incentive spirometer in an upright position. 3. Breathe out normally. 4. Place the mouthpiece in your mouth and seal your lips tightly around  it. 5. Breathe in slowly and as deeply as possible, raising the piston or the ball toward the top of the column. 6. Hold your breath for 3-5 seconds or for as long as possible. Allow the piston or ball to fall to the bottom of the column. 7. Remove the mouthpiece from your mouth and breathe out normally. 8. Rest for a few seconds and repeat Steps 1 through 7 at least 10 times every 1-2 hours when you are awake. Take your time and take a few normal breaths between deep breaths. 9. The spirometer may include an indicator to show your best effort. Use  the indicator as a goal to work toward during each repetition. 10. After each set of 10 deep breaths, practice coughing to be sure your lungs are clear. If you have an incision (the cut made at the time of surgery), support your incision when coughing by placing a pillow or rolled up towels firmly against it. Once you are able to get out of bed, walk around indoors and cough well. You may stop using the incentive spirometer when instructed by your caregiver.  RISKS AND COMPLICATIONS  Take your time so you do not get dizzy or light-headed.  If you are in pain, you may need to take or ask for pain medication before doing incentive spirometry. It is harder to take a deep breath if you are having pain. AFTER USE  Rest and breathe slowly and easily.  It can be helpful to keep track of a log of your progress. Your caregiver can provide you with a simple table to help with this. If you are using the spirometer at home, follow these instructions: [place] IF:   You are having difficultly using the spirometer.  You have trouble using the spirometer as often as instructed.  Your pain medication is not giving enough relief while using the spirometer.  You develop fever of 100.5 F (38.1 C) or higher. SEEK IMMEDIATE MEDICAL CARE IF:   You cough up bloody sputum that had not been present before.  You develop fever of 102 F (38.9 C) or  greater.  You develop worsening pain at or near the incision site. MAKE SURE YOU:   Understand these instructions.  Will watch your condition.  Will get help right away if you are not doing well or get worse. Document Released: 07/24/2006 Document Revised: 06/05/2011 Document Reviewed: 09/24/2006 Exit[employer] Patient Information 2014 [employer], [place].   ________________________________________________________________________  WHAT IS A BLOOD TRANSFUSION? Blood Transfusion Information  A transfusion is the replacement of blood or some of its parts. Blood is made up of multiple cells which provide different functions.  Red blood cells carry oxygen and are used for blood loss replacement.  White blood cells fight against infection.  Platelets control bleeding.  Plasma helps clot blood.  Other blood products are available for specialized needs, such as hemophilia or other clotting disorders. BEFORE THE TRANSFUSION  Who gives blood for transfusions?   Healthy volunteers who are fully evaluated to make sure their blood is safe. This is blood bank blood. Transfusion therapy is the safest it has ever been in the practice of medicine. Before blood is taken from a donor, a complete history is taken to make sure that person has no history of diseases nor engages in risky social behavior (examples are intravenous drug use or sexual activity with multiple partners). The donor's travel history is screened to minimize risk of transmitting infections, such as malaria. The donated blood is tested for signs of infectious diseases, such as HIV and hepatitis. The blood is then tested to be sure it is compatible with you in order to minimize the chance of a transfusion reaction. If you or a relative donates blood, this is often done in anticipation of surgery and is not appropriate for emergency situations. It takes many days to process the donated blood. RISKS AND COMPLICATIONS Although transfusion therapy  is very safe and saves many lives, the main dangers of transfusion include:   Getting an infectious disease.  Developing a transfusion reaction. This is an allergic reaction to something in the blood  you were given. Every precaution is taken to prevent this. The decision to have a blood transfusion has been considered carefully by your caregiver before blood is given. Blood is not given unless the benefits outweigh the risks. AFTER THE TRANSFUSION  Right after receiving a blood transfusion, you will usually feel much better and more energetic. This is especially true if your red blood cells have gotten low (anemic). The transfusion raises the level of the red blood cells which carry oxygen, and this usually causes an energy increase.  The nurse administering the transfusion will monitor you carefully for complications. HOME CARE INSTRUCTIONS  No special instructions are needed after a transfusion. You may find your energy is better. Speak with your caregiver about any limitations on activity for underlying diseases you may have. SEEK MEDICAL CARE IF:   Your condition is not improving after your transfusion.  You develop redness or irritation at the intravenous (IV) site. SEEK IMMEDIATE MEDICAL CARE IF:  Any of the following symptoms occur over the next 12 hours:  Shaking chills.  You have a temperature by mouth above 102 F (38.9 C), not controlled by medicine.  Chest, back, or muscle pain.  People around you feel you are not acting correctly or are confused.  Shortness of breath or difficulty breathing.  Dizziness and fainting.  You get a rash or develop hives.  You have a decrease in urine output.  Your urine turns a dark color or changes to pink, red, or brown. Any of the following symptoms occur over the next 10 days:  You have a temperature by mouth above 102 F (38.9 C), not controlled by medicine.  Shortness of breath.  Weakness after normal activity.  The white  part of the eye turns yellow (jaundice).  You have a decrease in the amount of urine or are urinating less often.  Your urine turns a dark color or changes to pink, red, or brown. Document Released: 03/10/2000 Document Revised: 06/05/2011 Document Reviewed: 10/28/2007 [facility] Patient Information 2014 [place], [place].  _______________________________________________________________________

## 2017-09-17 ENCOUNTER — Encounter (HOSPITAL_COMMUNITY): Payer: Self-pay

## 2017-09-17 ENCOUNTER — Other Ambulatory Visit: Payer: Self-pay

## 2017-09-17 ENCOUNTER — Encounter (HOSPITAL_COMMUNITY)
Admission: RE | Admit: 2017-09-17 | Discharge: 2017-09-17 | Disposition: A | Discharge status: Home / Self Care | Payer: BLUE CROSS/BLUE SHIELD | Source: Ambulatory Visit | Attending: Orthopaedic Surgery | Admitting: Orthopaedic Surgery

## 2017-09-17 DIAGNOSIS — M1611 Unilateral primary osteoarthritis, right hip: Secondary | ICD-10-CM | POA: Insufficient documentation

## 2017-09-17 DIAGNOSIS — Z01812 Encounter for preprocedural laboratory examination: Secondary | ICD-10-CM | POA: Diagnosis not present

## 2017-09-17 LAB — SURGICAL PCR SCREEN
MRSA, PCR: NEGATIVE
Staphylococcus aureus: NEGATIVE

## 2017-09-17 LAB — CBC
HCT: 35.8 % — ABNORMAL LOW (ref 36.0–46.0)
Hemoglobin: 11 g/dL — ABNORMAL LOW (ref 12.0–15.0)
MCH: 23.6 pg — ABNORMAL LOW (ref 26.0–34.0)
MCHC: 30.7 g/dL (ref 30.0–36.0)
MCV: 76.8 fL — ABNORMAL LOW (ref 78.0–100.0)
Platelets: 400 10*3/uL (ref 150–400)
RBC: 4.66 MIL/uL (ref 3.87–5.11)
RDW: 16.8 % — ABNORMAL HIGH (ref 11.5–15.5)
WBC: 4.6 10*3/uL (ref 4.0–10.5)

## 2017-09-17 LAB — BASIC METABOLIC PANEL
Anion gap: 5 (ref 5–15)
BUN: 10 mg/dL (ref 6–20)
CO2: 28 mmol/L (ref 22–32)
Calcium: 8.9 mg/dL (ref 8.9–10.3)
Chloride: 107 mmol/L (ref 101–111)
Creatinine, Ser: 0.75 mg/dL (ref 0.44–1.00)
GFR calc Af Amer: >60 mL/min (ref >60)
GFR calc non Af Amer: >60 mL/min (ref >60)
Glucose, Bld: 93 mg/dL (ref 65–99)
Potassium: 4.6 mmol/L (ref 3.5–5.1)
Sodium: 140 mmol/L (ref 135–145)

## 2017-09-17 LAB — PREGNANCY, URINE: Preg Test, Ur: NEGATIVE

## 2017-09-20 MED ORDER — TRANEXAMIC ACID 1000 MG/10ML IV SOLN
1000.0000 mg | INTRAVENOUS | Status: DC
  Filled 2017-09-20: qty 10

## 2017-09-20 NOTE — Progress Notes (Signed)
Pt aware of surgical time change. Pt verbalized understanding to [facility] at approx 8:30am and no food or drink after midnight. If instructed during PST appt to take any medication am of surgery need to follow instruction with small sip of water other wise no food or drink after midnight.

## 2017-09-21 ENCOUNTER — Inpatient Hospital Stay (HOSPITAL_COMMUNITY): Payer: BLUE CROSS/BLUE SHIELD | Admitting: Anesthesiology

## 2017-09-21 ENCOUNTER — Inpatient Hospital Stay (HOSPITAL_COMMUNITY): Payer: BLUE CROSS/BLUE SHIELD

## 2017-09-21 ENCOUNTER — Other Ambulatory Visit: Payer: Self-pay

## 2017-09-21 ENCOUNTER — Inpatient Hospital Stay (HOSPITAL_COMMUNITY)
Admission: RE | Admit: 2017-09-21 | Discharge: 2017-09-24 | DRG: 470 | Disposition: A | Discharge status: Home Health | Payer: BLUE CROSS/BLUE SHIELD | Attending: Orthopaedic Surgery | Admitting: Orthopaedic Surgery

## 2017-09-21 ENCOUNTER — Encounter (HOSPITAL_COMMUNITY): Payer: Self-pay

## 2017-09-21 ENCOUNTER — Encounter (HOSPITAL_COMMUNITY)
Admission: RE | Disposition: A | Discharge status: Home Health | Payer: Self-pay | Source: Home / Self Care | Attending: Orthopaedic Surgery

## 2017-09-21 DIAGNOSIS — E669 Obesity, unspecified: Secondary | ICD-10-CM | POA: Diagnosis present

## 2017-09-21 DIAGNOSIS — M1611 Unilateral primary osteoarthritis, right hip: Secondary | ICD-10-CM

## 2017-09-21 DIAGNOSIS — F410 Panic disorder [episodic paroxysmal anxiety] without agoraphobia: Secondary | ICD-10-CM | POA: Diagnosis present

## 2017-09-21 DIAGNOSIS — F419 Anxiety disorder, unspecified: Secondary | ICD-10-CM | POA: Diagnosis present

## 2017-09-21 DIAGNOSIS — Z888 Allergy status to other drugs, medicaments and biological substances status: Secondary | ICD-10-CM | POA: Diagnosis not present

## 2017-09-21 DIAGNOSIS — Q796 Ehlers-Danlos syndrome: Secondary | ICD-10-CM | POA: Diagnosis not present

## 2017-09-21 DIAGNOSIS — D649 Anemia, unspecified: Secondary | ICD-10-CM | POA: Diagnosis not present

## 2017-09-21 DIAGNOSIS — Z96641 Presence of right artificial hip joint: Secondary | ICD-10-CM

## 2017-09-21 DIAGNOSIS — Z96642 Presence of left artificial hip joint: Secondary | ICD-10-CM | POA: Diagnosis not present

## 2017-09-21 DIAGNOSIS — M25551 Pain in right hip: Secondary | ICD-10-CM | POA: Diagnosis not present

## 2017-09-21 DIAGNOSIS — E559 Vitamin D deficiency, unspecified: Secondary | ICD-10-CM | POA: Diagnosis not present

## 2017-09-21 DIAGNOSIS — R0600 Dyspnea, unspecified: Secondary | ICD-10-CM | POA: Diagnosis not present

## 2017-09-21 DIAGNOSIS — Z6829 Body mass index (BMI) 29.0-29.9, adult: Secondary | ICD-10-CM

## 2017-09-21 DIAGNOSIS — Z419 Encounter for procedure for purposes other than remedying health state, unspecified: Secondary | ICD-10-CM

## 2017-09-21 DIAGNOSIS — Z471 Aftercare following joint replacement surgery: Secondary | ICD-10-CM | POA: Diagnosis not present

## 2017-09-21 HISTORY — PX: TOTAL HIP ARTHROPLASTY: SHX124

## 2017-09-21 LAB — TYPE AND SCREEN
ABO/RH(D): O POS
Antibody Screen: NEGATIVE

## 2017-09-21 SURGERY — ARTHROPLASTY, HIP, TOTAL, ANTERIOR APPROACH
Anesthesia: Spinal | Site: Hip | Laterality: Right

## 2017-09-21 MED ORDER — PROPOFOL 500 MG/50ML IV EMUL
INTRAVENOUS | Status: DC | PRN
  Administered 2017-09-21: 20 mg via INTRAVENOUS

## 2017-09-21 MED ORDER — POLYETHYLENE GLYCOL 3350 17 G PO PACK: 17.0000 g | PACK | Freq: Every day | ORAL | Status: DC | PRN

## 2017-09-21 MED ORDER — ONDANSETRON HCL 4 MG/2ML IJ SOLN: 4.0000 mg | Freq: Four times a day (QID) | INTRAMUSCULAR | Status: DC | PRN

## 2017-09-21 MED ORDER — ONDANSETRON HCL 4 MG PO TABS: 4.0000 mg | ORAL_TABLET | Freq: Four times a day (QID) | ORAL | Status: DC | PRN

## 2017-09-21 MED ORDER — PROMETHAZINE HCL 25 MG/ML IJ SOLN: 6.2500 mg | INTRAMUSCULAR | Status: DC | PRN

## 2017-09-21 MED ORDER — OXYCODONE HCL 5 MG PO TABS
5.0000 mg | ORAL_TABLET | ORAL | Status: DC | PRN
  Filled 2017-09-21: qty 1
  Filled 2017-09-21: qty 2

## 2017-09-21 MED ORDER — SODIUM CHLORIDE 0.9 % IR SOLN
Status: DC | PRN
  Administered 2017-09-21: 1000 mL

## 2017-09-21 MED ORDER — PHENYLEPHRINE HCL 10 MG/ML IJ SOLN
INTRAMUSCULAR | Status: DC | PRN
  Administered 2017-09-21: 80 ug via INTRAVENOUS
  Administered 2017-09-21: 40 ug via INTRAVENOUS
  Administered 2017-09-21: 80 ug via INTRAVENOUS

## 2017-09-21 MED ORDER — BUPIVACAINE IN DEXTROSE 0.75-8.25 % IT SOLN
INTRATHECAL | Status: DC | PRN
  Administered 2017-09-21: 2 mL via INTRATHECAL

## 2017-09-21 MED ORDER — METOCLOPRAMIDE HCL 5 MG/ML IJ SOLN: 5.0000 mg | Freq: Three times a day (TID) | INTRAMUSCULAR | Status: DC | PRN

## 2017-09-21 MED ORDER — DEXAMETHASONE SODIUM PHOSPHATE 10 MG/ML IJ SOLN
INTRAMUSCULAR | Status: DC | PRN
  Administered 2017-09-21: 10 mg via INTRAVENOUS

## 2017-09-21 MED ORDER — ROPIVACAINE HCL 5 MG/ML IJ SOLN: INTRAMUSCULAR | Status: DC | PRN

## 2017-09-21 MED ORDER — SERTRALINE HCL 50 MG PO TABS
50.0000 mg | ORAL_TABLET | Freq: Every day | ORAL | Status: DC
  Administered 2017-09-21 – 2017-09-23 (×3): 50 mg via ORAL
  Filled 2017-09-21 (×3): qty 1

## 2017-09-21 MED ORDER — CEFAZOLIN SODIUM-DEXTROSE 2-4 GM/100ML-% IV SOLN: 2.0000 g | INTRAVENOUS | Status: DC

## 2017-09-21 MED ORDER — PANTOPRAZOLE SODIUM 40 MG PO TBEC
40.0000 mg | DELAYED_RELEASE_TABLET | Freq: Every day | ORAL | Status: DC
  Administered 2017-09-21 – 2017-09-24 (×4): 40 mg via ORAL
  Filled 2017-09-21 (×4): qty 1

## 2017-09-21 MED ORDER — CLINDAMYCIN PHOSPHATE 600 MG/50ML IV SOLN
600.0000 mg | Freq: Four times a day (QID) | INTRAVENOUS | Status: AC
  Administered 2017-09-21 (×2): 600 mg via INTRAVENOUS
  Filled 2017-09-21 (×2): qty 50

## 2017-09-21 MED ORDER — METHOCARBAMOL 500 MG PO TABS
750.0000 mg | ORAL_TABLET | Freq: Four times a day (QID) | ORAL | Status: DC | PRN
  Administered 2017-09-21 – 2017-09-24 (×7): 750 mg via ORAL
  Filled 2017-09-21 (×7): qty 2

## 2017-09-21 MED ORDER — PHENYLEPHRINE HCL 10 MG/ML IJ SOLN
INTRAMUSCULAR | Status: AC
  Filled 2017-09-21: qty 1

## 2017-09-21 MED ORDER — DIPHENHYDRAMINE HCL 12.5 MG/5ML PO ELIX: 12.5000 mg | ORAL_SOLUTION | ORAL | Status: DC | PRN

## 2017-09-21 MED ORDER — ALUM & MAG HYDROXIDE-SIMETH 200-200-20 MG/5ML PO SUSP: 30.0000 mL | ORAL | Status: DC | PRN

## 2017-09-21 MED ORDER — PROPOFOL 10 MG/ML IV BOLUS
INTRAVENOUS | Status: AC
  Filled 2017-09-21: qty 20

## 2017-09-21 MED ORDER — METOCLOPRAMIDE HCL 5 MG PO TABS: 5.0000 mg | ORAL_TABLET | Freq: Three times a day (TID) | ORAL | Status: DC | PRN

## 2017-09-21 MED ORDER — ACETAMINOPHEN 325 MG PO TABS
325.0000 mg | ORAL_TABLET | Freq: Four times a day (QID) | ORAL | Status: DC | PRN
  Administered 2017-09-22 – 2017-09-23 (×2): 650 mg via ORAL
  Filled 2017-09-21 (×2): qty 2

## 2017-09-21 MED ORDER — METOPROLOL SUCCINATE ER 50 MG PO TB24
50.0000 mg | ORAL_TABLET | Freq: Every day | ORAL | Status: DC
  Administered 2017-09-21 – 2017-09-23 (×3): 50 mg via ORAL
  Filled 2017-09-21 (×3): qty 1

## 2017-09-21 MED ORDER — MECLIZINE HCL 25 MG PO TABS: 25.0000 mg | ORAL_TABLET | Freq: Three times a day (TID) | ORAL | Status: DC | PRN

## 2017-09-21 MED ORDER — PROPOFOL 500 MG/50ML IV EMUL
INTRAVENOUS | Status: DC | PRN
  Administered 2017-09-21: 50 ug/kg/min via INTRAVENOUS

## 2017-09-21 MED ORDER — OXYCODONE HCL 5 MG PO TABS: 5.0000 mg | ORAL_TABLET | Freq: Once | ORAL | Status: DC | PRN

## 2017-09-21 MED ORDER — SUMATRIPTAN SUCCINATE 6 MG/0.5ML ~~LOC~~ SOLN
6.0000 mg | SUBCUTANEOUS | Status: DC | PRN
  Filled 2017-09-21: qty 0.5

## 2017-09-21 MED ORDER — MIDAZOLAM HCL 2 MG/2ML IJ SOLN
INTRAMUSCULAR | Status: AC
  Filled 2017-09-21: qty 2

## 2017-09-21 MED ORDER — LACTATED RINGERS IV SOLN
INTRAVENOUS | Status: DC
  Administered 2017-09-21 (×2): via INTRAVENOUS

## 2017-09-21 MED ORDER — OXYCODONE HCL 5 MG PO TABS
10.0000 mg | ORAL_TABLET | ORAL | Status: DC | PRN
  Administered 2017-09-21: 5 mg via ORAL
  Administered 2017-09-21: 15 mg via ORAL
  Administered 2017-09-21: 10 mg via ORAL
  Administered 2017-09-22 – 2017-09-24 (×12): 15 mg via ORAL
  Filled 2017-09-21 (×13): qty 3

## 2017-09-21 MED ORDER — PROCHLORPERAZINE MALEATE 10 MG PO TABS
10.0000 mg | ORAL_TABLET | Freq: Four times a day (QID) | ORAL | Status: DC | PRN
  Filled 2017-09-21: qty 1

## 2017-09-21 MED ORDER — FENTANYL CITRATE (PF) 100 MCG/2ML IJ SOLN: 25.0000 ug | INTRAMUSCULAR | Status: DC | PRN

## 2017-09-21 MED ORDER — STERILE WATER FOR IRRIGATION IR SOLN
Status: DC | PRN
  Administered 2017-09-21: 2000 mL

## 2017-09-21 MED ORDER — MENTHOL 3 MG MT LOZG: 1.0000 | LOZENGE | OROMUCOSAL | Status: DC | PRN

## 2017-09-21 MED ORDER — MIDAZOLAM HCL 5 MG/5ML IJ SOLN
INTRAMUSCULAR | Status: DC | PRN
  Administered 2017-09-21: 2 mg via INTRAVENOUS

## 2017-09-21 MED ORDER — PROPOFOL 10 MG/ML IV BOLUS
INTRAVENOUS | Status: AC
  Filled 2017-09-21: qty 40

## 2017-09-21 MED ORDER — DOCUSATE SODIUM 100 MG PO CAPS
100.0000 mg | ORAL_CAPSULE | Freq: Two times a day (BID) | ORAL | Status: DC
  Administered 2017-09-21 – 2017-09-24 (×6): 100 mg via ORAL
  Filled 2017-09-21 (×6): qty 1

## 2017-09-21 MED ORDER — HYDROMORPHONE HCL 1 MG/ML IJ SOLN
1.0000 mg | INTRAMUSCULAR | Status: DC | PRN
  Administered 2017-09-21 – 2017-09-22 (×4): 2 mg via INTRAVENOUS
  Administered 2017-09-22: 1 mg via INTRAVENOUS
  Administered 2017-09-22 (×2): 2 mg via INTRAVENOUS
  Administered 2017-09-23 (×3): 1 mg via INTRAVENOUS
  Filled 2017-09-21: qty 2
  Filled 2017-09-21 (×2): qty 1
  Filled 2017-09-21: qty 2
  Filled 2017-09-21: qty 1
  Filled 2017-09-21: qty 2
  Filled 2017-09-21: qty 1
  Filled 2017-09-21 (×3): qty 2
  Filled 2017-09-21: qty 1

## 2017-09-21 MED ORDER — ONDANSETRON HCL 4 MG/2ML IJ SOLN
INTRAMUSCULAR | Status: DC | PRN
  Administered 2017-09-21: 4 mg via INTRAVENOUS

## 2017-09-21 MED ORDER — DEXTROSE 5 % IV SOLN
INTRAVENOUS | Status: DC | PRN
  Administered 2017-09-21: 25 ug/min via INTRAVENOUS

## 2017-09-21 MED ORDER — ASPIRIN 81 MG PO CHEW
81.0000 mg | CHEWABLE_TABLET | Freq: Two times a day (BID) | ORAL | Status: DC
Start: 2017-09-21 — End: 2017-09-24
  Administered 2017-09-21 – 2017-09-24 (×6): 81 mg via ORAL
  Filled 2017-09-21 (×6): qty 1

## 2017-09-21 MED ORDER — PHENOL 1.4 % MT LIQD
1.0000 | OROMUCOSAL | Status: DC | PRN
  Filled 2017-09-21: qty 177

## 2017-09-21 MED ORDER — METOPROLOL SUCCINATE ER 50 MG PO TB24: 50.0000 mg | ORAL_TABLET | Freq: Every day | ORAL | Status: DC

## 2017-09-21 MED ORDER — OXYCODONE HCL 5 MG/5ML PO SOLN
5.0000 mg | Freq: Once | ORAL | Status: DC | PRN
  Filled 2017-09-21: qty 5

## 2017-09-21 MED ORDER — SODIUM CHLORIDE 0.9 % IV SOLN
INTRAVENOUS | Status: DC
  Administered 2017-09-21: 13:00:00 via INTRAVENOUS

## 2017-09-21 MED ORDER — CHLORHEXIDINE GLUCONATE 4 % EX LIQD: 60.0000 mL | Freq: Once | CUTANEOUS | Status: DC

## 2017-09-21 MED ORDER — CLINDAMYCIN PHOSPHATE 900 MG/50ML IV SOLN
900.0000 mg | Freq: Once | INTRAVENOUS | Status: AC
  Administered 2017-09-21: 900 mg via INTRAVENOUS
  Filled 2017-09-21: qty 50

## 2017-09-21 SURGICAL SUPPLY — 41 items
BAG ZIPLOCK 12X15 (MISCELLANEOUS) IMPLANT
BENZOIN TINCTURE PRP APPL 2/3 (GAUZE/BANDAGES/DRESSINGS) ×2 IMPLANT
BLADE SAW SGTL 18X1.27X75 (BLADE) ×2 IMPLANT
CAPT HIP TOTAL 2 ×2 IMPLANT
COVER PERINEAL POST (MISCELLANEOUS) ×2 IMPLANT
COVER SURGICAL LIGHT HANDLE (MISCELLANEOUS) ×2 IMPLANT
DRAPE STERI IOBAN 125X83 (DRAPES) ×2 IMPLANT
DRAPE U-SHAPE 47X51 STRL (DRAPES) ×4 IMPLANT
DRSG AQUACEL AG ADV 3.5X10 (GAUZE/BANDAGES/DRESSINGS) ×2 IMPLANT
DURAPREP 26ML APPLICATOR (WOUND CARE) ×2 IMPLANT
ELECT REM PT RETURN 15FT ADLT (MISCELLANEOUS) ×2 IMPLANT
FACESHIELD WRAPAROUND (MASK) ×2 IMPLANT
GAUZE XEROFORM 1X8 LF (GAUZE/BANDAGES/DRESSINGS) IMPLANT
GLOVE BIO SURGEON STRL SZ7.5 (GLOVE) ×2 IMPLANT
GLOVE BIOGEL PI IND STRL 6.5 (GLOVE) ×1 IMPLANT
GLOVE BIOGEL PI IND STRL 7.0 (GLOVE) ×3 IMPLANT
GLOVE BIOGEL PI IND STRL 8 (GLOVE) ×3 IMPLANT
GLOVE BIOGEL PI INDICATOR 6.5 (GLOVE) ×1
GLOVE BIOGEL PI INDICATOR 7.0 (GLOVE) ×3
GLOVE BIOGEL PI INDICATOR 8 (GLOVE) ×3
GLOVE ECLIPSE 8.0 STRL XLNG CF (GLOVE) ×2 IMPLANT
GLOVE SURG SS PI 7.0 STRL IVOR (GLOVE) ×2 IMPLANT
GLOVE SURG SS PI 8.0 STRL IVOR (GLOVE) ×2 IMPLANT
GOWN STRL REIN 3XL XLG LVL4 (GOWN DISPOSABLE) ×2 IMPLANT
GOWN STRL REUS W/TWL LRG LVL3 (GOWN DISPOSABLE) ×4 IMPLANT
GOWN STRL REUS W/TWL XL LVL3 (GOWN DISPOSABLE) ×4 IMPLANT
HANDPIECE INTERPULSE COAX TIP (DISPOSABLE) ×1
HOLDER FOLEY CATH W/STRAP (MISCELLANEOUS) ×2 IMPLANT
PACK ANTERIOR HIP CUSTOM (KITS) ×2 IMPLANT
SET HNDPC FAN SPRY TIP SCT (DISPOSABLE) ×1 IMPLANT
STAPLER VISISTAT 35W (STAPLE) IMPLANT
STRIP CLOSURE SKIN 1/2X4 (GAUZE/BANDAGES/DRESSINGS) ×2 IMPLANT
SUT ETHIBOND NAB CT1 #1 30IN (SUTURE) ×2 IMPLANT
SUT MNCRL AB 4-0 PS2 18 (SUTURE) ×2 IMPLANT
SUT VIC AB 0 CT1 36 (SUTURE) ×2 IMPLANT
SUT VIC AB 1 CT1 36 (SUTURE) ×2 IMPLANT
SUT VIC AB 2-0 CT1 27 (SUTURE) ×2
SUT VIC AB 2-0 CT1 TAPERPNT 27 (SUTURE) ×2 IMPLANT
TRAY FOLEY CATH 14FRSI W/METER (CATHETERS) ×2 IMPLANT
TRAY FOLEY MTR SLVR 16FR STAT (SET/KITS/TRAYS/PACK) IMPLANT
YANKAUER SUCT BULB TIP 10FT TU (MISCELLANEOUS) ×2 IMPLANT

## 2017-09-21 NOTE — Anesthesia Preprocedure Evaluation (Signed)
Anesthesia Evaluation  Patient identified by MRN, date of birth, ID band Patient awake    Reviewed: Allergy & Precautions, NPO status , Patient's Chart, lab work & pertinent test results, reviewed documented beta blocker date and time   Airway Mallampati: II  TM Distance: >3 FB Neck ROM: Full    Dental no notable dental hx. (+) Teeth Intact   Pulmonary shortness of breath and with exertion,    Pulmonary exam normal breath sounds clear to auscultation       Cardiovascular negative cardio ROS Normal cardiovascular exam Rhythm:Regular Rate:Normal     Neuro/Psych  Headaches, PSYCHIATRIC DISORDERS Anxiety Panic disorderNeurogenic syncope    GI/Hepatic Neg liver ROS, Gastroparesis   Endo/Other  Obesity  Renal/GU negative Renal ROS  negative genitourinary   Musculoskeletal  (+) Arthritis , Osteoarthritis,  Ehrler's Danlos syndrome type III   Abdominal (+) + obese,   Peds  Hematology  (+) anemia ,   Anesthesia Other Findings   Reproductive/Obstetrics                             Anesthesia Physical  Anesthesia Plan  ASA: II  Anesthesia Plan: Spinal   Post-op Pain Management:    Induction:   PONV Risk Score and Plan: 2 and Treatment may vary due to age or medical condition and Propofol infusion  Airway Management Planned: Natural Airway and Simple Face Mask  Additional Equipment: None  Intra-op Plan:   Post-operative Plan:   Informed Consent: I have reviewed the patients History and Physical, chart, labs and discussed the procedure including the risks, benefits and alternatives for the proposed anesthesia with the patient or authorized representative who has indicated his/her understanding and acceptance.     Plan Discussed with: CRNA and Anesthesiologist  Anesthesia Plan Comments: (Labs reviewed. Platelets acceptable, patient not taking any blood thinning medications. Risks and  benefits discussed with patient, particularly theoretical increased risk of PDPH in setting of EDS, patient expressed understanding and wished to proceed.)      Anesthesia Quick Evaluation

## 2017-09-21 NOTE — Brief Op Note (Signed)
09/21/2017  11:34 AM  PATIENT:  Angela BrilliantJennifer F Harrison  42 y.o. female  PRE-OPERATIVE DIAGNOSIS:  osteoarthritis right hip  POST-OPERATIVE DIAGNOSIS:  osteoarthritis right hip  PROCEDURE:  Procedure(s): RIGHT TOTAL HIP ARTHROPLASTY ANTERIOR APPROACH (Right)  SURGEON:  Surgeon(s) and Role:    Kathryne Hitch* Brigida Scotti Y, MD - Primary  PHYSICIAN ASSISTANT: Rexene EdisonGil Clark, PA-C  ANESTHESIA:   spinal  EBL:  350 mL   COUNTS:  YES  DICTATION: .Other Dictation: Dictation Number [id number]  PLAN OF CARE: Admit to inpatient   PATIENT DISPOSITION:  PACU - hemodynamically stable.   Delay start of Pharmacological VTE agent (>24hrs) due to surgical blood loss or risk of bleeding: no

## 2017-09-21 NOTE — Evaluation (Signed)
Physical Therapy Evaluation Patient Details Name: Angela BrilliantJennifer F Carriger MRN: [id number] DOB: 09/14/1975 Today's Date: 09/21/2017   History of Present Illness  Pt s/p R THR and with hx of L THR, L ankle fx, and R tibia fx  Clinical Impression  Pt s/p R THR and presents with decreased R LE strength/ROM and post op pain limiting functional mobility.  Pt should progress to dc home with family assist    Follow Up Recommendations Follow surgeon's recommendation for DC plan and follow-up therapies    Equipment Recommendations  None recommended by PT    Recommendations for Other Services       Precautions / Restrictions Precautions Precautions: Fall Restrictions Weight Bearing Restrictions: No RLE Weight Bearing: Weight bearing as tolerated      Mobility  Bed Mobility Overal bed mobility: Needs Assistance Bed Mobility: Supine to Sit;Sit to Supine     Supine to sit: Min assist;Mod assist Sit to supine: Min assist;Mod assist   General bed mobility comments: cues for sequence and use of L LE to self assist  Transfers Overall transfer level: Needs assistance Equipment used: Rolling walker (2 wheeled) Transfers: Sit to/from Stand Sit to Stand: Min assist;From elevated surface         General transfer comment: cues for use of UEs to self assist  Ambulation/Gait Ambulation/Gait assistance: Min assist Gait Distance (Feet): 50 Feet Assistive device: Rolling walker (2 wheeled) Gait Pattern/deviations: Step-to pattern;Decreased step length - right;Decreased step length - left;Shuffle;Trunk flexed Gait velocity: decr   General Gait Details: cues for posture, position from RW and initial sequence  Stairs            Wheelchair Mobility    Modified Rankin (Stroke Patients Only)       Balance                                             Pertinent Vitals/Pain Pain Assessment: 0-10 Pain Score: 7  Pain Location: R hip Pain Descriptors / Indicators:  Aching;Sore Pain Intervention(s): Limited activity within patient's tolerance;Monitored during session;Premedicated before session;Ice applied    Home Living Family/patient expects to be discharged to:: Private residence Living Arrangements: Spouse/significant other Available Help at Discharge: Family;Available 24 hours/day;Friend(s) Type of Home: House Home Access: Stairs to enter Entrance Stairs-Rails: Doctor, general practiceight;Left Entrance Stairs-Number of Steps: 4 Home Layout: One level Home Equipment: Walker - 2 wheels;Bedside commode;Tub bench;Cane - single point;Crutches 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, governmentprofessor of Business Law    Prior Function Level of Independence: Independent with assistive device(s)               Higher education careers adviserHand Dominance        Extremity/Trunk Assessment   Upper Extremity Assessment Upper Extremity Assessment: Overall WFL for tasks assessed    Lower Extremity Assessment Lower Extremity Assessment: RLE deficits/detail    Cervical / Trunk Assessment Cervical / Trunk Assessment: Normal  Communication   Communication: No difficulties  Cognition Arousal/Alertness: Awake/alert Behavior During Therapy: WFL for tasks assessed/performed Overall Cognitive Status: Within Functional Limits for tasks assessed                                        General Comments      Exercises Total Joint Exercises Ankle Circles/Pumps: AROM;Both;15 reps;Supine   Assessment/Plan  PT Assessment Patient needs continued PT services  PT Problem List Decreased strength;Decreased range of motion;Decreased activity tolerance;Decreased mobility;Decreased knowledge of use of DME;Pain       PT Treatment Interventions DME instruction;Gait training;Stair training;Functional mobility training;Therapeutic activities;Therapeutic exercise;Patient/family education    PT Goals (Current goals can be found in the Care Plan section)  Acute Rehab PT Goals Patient Stated Goal: Regain  IND PT Goal Formulation: With patient Time For Goal Achievement: 09/28/17 Potential to Achieve Goals: Good    Frequency 7X/week   Barriers to discharge        Co-evaluation               AM-PAC PT "6 Clicks" Daily Activity  Outcome Measure Difficulty turning over in bed (including adjusting bedclothes, sheets and blankets)?: Unable Difficulty moving from lying on back to sitting on the side of the bed? : Unable Difficulty sitting down on and standing up from a chair with arms (e.g., wheelchair, bedside commode, etc,.)?: Unable Help needed moving to and from a bed to chair (including a wheelchair)?: A Little Help needed walking in hospital room?: A Little Help needed climbing 3-5 steps with a railing? : A Lot 6 Click Score: 11    End of Session Equipment Utilized During Treatment: Gait belt Activity Tolerance: Patient tolerated treatment well;Patient limited by pain Patient left: in bed;with call bell/phone within reach;with family/visitor present Nurse Communication: Mobility status PT Visit Diagnosis: Difficulty in walking, not elsewhere classified (R26.2)    Time: 4098-1191 PT Time Calculation (min) (ACUTE ONLY): 29 min   Charges:   PT Evaluation $PT Eval Low Complexity: 1 Low PT Treatments $Gait Training: 8-22 mins   PT G Codes:        Pg [phone]   Jerrine Urschel 09/21/2017, 5:48 PM

## 2017-09-21 NOTE — Plan of Care (Signed)
Reviewed plan of care with pt and spouse, specifically pain control measures, safety precautions, IS use, and importance of notifying RN with any questions or concerns. Pt attentive and verbalized understanding of all education.

## 2017-09-21 NOTE — Transfer of Care (Signed)
Immediate Anesthesia Transfer of Care Note  Patient: Angela BrilliantJennifer F Harrison  Procedure(s) Performed: RIGHT TOTAL HIP ARTHROPLASTY ANTERIOR APPROACH (Right Hip)  Patient Location: PACU  Anesthesia Type:Spinal  Level of Consciousness: drowsy and patient cooperative  Airway & Oxygen Therapy: Patient Spontanous Breathing and Patient connected to face mask oxygen  Post-op Assessment: Report given to RN and Post -op Vital signs reviewed and stable  Post vital signs: Reviewed and stable  Last Vitals:  Vitals Value Taken Time  BP 99/64 09/21/2017 11:48 AM  Temp 36.4 C 09/21/2017 11:48 AM  Pulse 59 09/21/2017 11:50 AM  Resp 15 09/21/2017 11:50 AM  SpO2 100 % 09/21/2017 11:50 AM  Vitals shown include unvalidated device data.  Last Pain:  Vitals:   09/21/17 0850  TempSrc:   PainSc: 6       Patients Stated Pain Goal: 3 (09/21/17 0850)  Complications: No apparent anesthesia complications

## 2017-09-21 NOTE — H&P (Signed)
TOTAL HIP ADMISSION H&P  Patient is admitted for right total hip arthroplasty.  Subjective:  Chief Complaint: right hip pain  HPI: Angela Harrison, 42 y.o. female, has a history of pain and functional disability in the right hip(s) due to arthritis and patient has failed non-surgical conservative treatments for greater than 12 weeks to include NSAID's and/or analgesics, corticosteriod injections, flexibility and strengthening excercises, use of assistive devices, weight reduction as appropriate and activity modification.  Onset of symptoms was abrupt starting 1 years ago with rapidlly worsening course since that time.The patient noted no past surgery on the right hip(s).  Patient currently rates pain in the right hip at 10 out of 10 with activity. Patient has night pain, worsening of pain with activity and weight bearing, trendelenberg gait, pain that interfers with activities of daily living and pain with passive range of motion. Patient has evidence of subchondral sclerosis, periarticular osteophytes and joint space narrowing by imaging studies. This condition presents safety issues increasing the risk of falls.  There is no current active infection.  Patient Active Problem List   Diagnosis Date Noted  . Unilateral primary osteoarthritis, right hip 09/21/2017  . Vitamin D deficiency 12/16/2016  . Ehlers-Danlos syndrome type III   . Arthritis   . Migraine   . 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 Closed left tibial fracture 04/11/2016  . Fracture tibia/fibula, left, closed, initial encounter 04/11/2016  . Obese 08/25/2015  . S/P left THA, AA 08/24/2015  . PANIC DISORDER WITHOUT AGORAPHOBIA 09/24/2007  . MIGRAINE HEADACHE 09/24/2007  . 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 DYSPNEA 09/24/2007   Past Medical History:  Diagnosis Date  . Anemia    PMH  . Arthritis    oa  . 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 Ehlers-Danlos syndrome type III   . Gastric paresis   . Migraine    migraines  . Syncope    neurocardiogenic takes toprol and zoloft for  . Vitamin D deficiency 12/16/2016    Past Surgical History:  Procedure Laterality Date  . CHOLECYSTECTOMY    . ESOPHAGOGASTRODUODENOSCOPY ENDOSCOPY     x 2 or 3  . FIBULA FRACTURE SURGERY Left 12/14/2016  . HARDWARE REMOVAL Left 12/14/2016   Procedure: HARDWARE REMOVAL LEFT TIBIA;  Surgeon: Myrene GalasHandy, Michael, MD;  [facility];  Service: Orthopedics;  Laterality: Left;  . IM NAILING TIBIA Left 12/14/2016  . KNEE SURGERY Bilateral    2 on each knee  . MAXILLARY ANTROSTOMY    . ORIF ANKLE FRACTURE Left 04/12/2016   Procedure: OPEN REDUCTION INTERNAL FIXATION (ORIF) ANKLE LEFT;  Surgeon: Yolonda KidaJason Patrick Rogers, MD;  [facility];  Service: Orthopedics;  Laterality: Left;  . ORIF FIBULA FRACTURE Left 12/14/2016   Procedure: NON-UNION REPAIR LEFT FIBULA FRACTURE;  Surgeon: Myrene GalasHandy, Michael, MD;  [facility];  Service: Orthopedics;  Laterality: Left;  . SHOULDER SURGERY Left    x 4  . TIBIA HARDWARE REMOVAL Left 12/14/2016  . TIBIA IM NAIL INSERTION Left 04/12/2016   Procedure: INTRAMEDULLARY (IM) NAIL TIBIAL LEFT;  Surgeon: Yolonda KidaJason Patrick Rogers, MD;  [facility];  Service: Orthopedics;  Laterality: Left;  . TIBIA IM NAIL INSERTION Left 12/14/2016   Procedure: INTRAMEDULLARY (IM) NAIL LEFT TIBIAL;  Surgeon: Myrene GalasHandy, Michael, MD;  Location: [facility] OR;  Service: Orthopedics;  Laterality: Left;  . TONSILLECTOMY     adenoids also  .  TOTAL HIP ARTHROPLASTY Left 08/24/2015   Procedure: LEFT TOTAL HIP ARTHROPLASTY ANTERIOR APPROACH;  Surgeon: Durene Romans, MD;  Location: [facility] ORS;  Service: Orthopedics;  Laterality: Left;    Current Facility-Administered Medications  Medication Dose Route Frequency Provider Last Rate Last Dose  . ceFAZolin (ANCEF) IVPB 2g/100 mL premix  2 g Intravenous On Call to OR Kirtland Bouchard, PA-C       . chlorhexidine (HIBICLENS) 4 % liquid 4 application  60 mL Topical Once Richardean Canal W, PA-C      . tranexamic acid (CYKLOKAPRON) 1,000 mg in sodium chloride 0.9 % 100 mL IVPB  1,000 mg Intravenous To OR Cindi Carbon, [facility]       Allergies  Allergen Reactions  . Cephalosporins Hives, Swelling, Rash and Other (See Comments)    Can tolerate Penicillin  . Cefazolin Hives, Swelling, Rash and Other (See Comments)    Can tolerate Penicillin   . Cephalexin Hives, Swelling, Rash and Other (See Comments)    Can tolerate Penicillin     Social History   Tobacco Use  . Smoking status: Never Smoker  . Smokeless tobacco: Never Used  Substance Use Topics  . Alcohol use: Yes    Comment: Socially     Family History  Problem Relation Age of Onset  . Breast cancer Mother   . Migraines Mother   . Hypertension Father   . Migraines Father   . Migraines Brother   . Migraines Other      Review of Systems  Musculoskeletal: Positive for joint pain.  All other systems reviewed and are negative.   Objective:  Physical Exam  Constitutional: She is oriented to person, place, and time. She appears well-developed and well-nourished.  HENT:  Head: Normocephalic and atraumatic.  Eyes: EOM are normal.  Neck: Normal range of motion. Neck supple.  Cardiovascular: Normal rate and regular rhythm.  Respiratory: Effort normal and breath sounds normal.  GI: Soft. Bowel sounds are normal.  Musculoskeletal:       Right hip: She exhibits decreased strength, tenderness and bony tenderness.  Neurological: She is alert and oriented to person, place, and time.  Skin: Skin is warm and dry.  Psychiatric: She has a normal mood and affect.    Vital signs in last 24 hours: Temp:  [97.9 F (36.6 C)] 97.9 F (36.6 C) (06/28 0832) Pulse Rate:  [69] 69 (06/28 0832) Resp:  [16] 16 (06/28 0832) BP: (128)/(85) 128/85 (06/28 0832) SpO2:  [98 %] 98 % (06/28 0832) Weight:  [202 lb 2 oz (91.7 kg)] 202 lb 2 oz  (91.7 kg) (06/28 0833)  Labs:   Estimated body mass index is 29 kg/m as calculated from the following:   Height as of this encounter: 5\' 10"  (1.778 m).   Weight as of this encounter: 202 lb 2 oz (91.7 kg).   Imaging Review Plain radiographs demonstrate severe degenerative joint disease of the right hip(s). The bone quality appears to be excellent for age and reported activity level.    Preoperative templating of the joint replacement has been completed, documented, and submitted to the Operating Room personnel in order to optimize intra-operative equipment management.     Assessment/Plan:  End stage arthritis, right hip(s)  The patient history, physical examination, clinical judgement of the provider and imaging studies are consistent with end stage degenerative joint disease of the right hip(s) and total hip arthroplasty is deemed medically necessary. The treatment options including medical management, injection therapy, arthroscopy and arthroplasty  were discussed at length. The risks and benefits of total hip arthroplasty were presented and reviewed. The risks due to aseptic loosening, infection, stiffness, dislocation/subluxation,  thromboembolic complications and other imponderables were discussed.  The patient acknowledged the explanation, agreed to proceed with the plan and consent was signed. Patient is being admitted for inpatient treatment for surgery, pain control, PT, OT, prophylactic antibiotics, VTE prophylaxis, progressive ambulation and ADL's and discharge planning.The patient is planning to be discharged home with home health services

## 2017-09-21 NOTE — Anesthesia Postprocedure Evaluation (Signed)
Anesthesia Post Note  Patient: Angela Harrison  Procedure(s) Performed: RIGHT TOTAL HIP ARTHROPLASTY ANTERIOR APPROACH (Right Hip)     Patient location during evaluation: PACU Anesthesia Type: Spinal Level of consciousness: awake and alert Pain management: pain level controlled Vital Signs Assessment: post-procedure vital signs reviewed and stable Respiratory status: spontaneous breathing and respiratory function stable Cardiovascular status: blood pressure returned to baseline and stable Postop Assessment: spinal receding and no apparent nausea or vomiting Anesthetic complications: no    Last Vitals:  Vitals:   09/21/17 1245 09/21/17 1300  BP: 110/78 111/77  Pulse: (!) 45 (!) 52  Resp: 15 18  Temp: (!) 36.4 C (!) 36.3 C  SpO2: 100% 100%    Last Pain:  Vitals:   09/21/17 1245  TempSrc:   PainSc: 0-No pain                 Beryle Lathehomas E Alfons Sulkowski

## 2017-09-21 NOTE — Anesthesia Procedure Notes (Signed)
Date/Time: 09/21/2017 10:11 AM Performed by: Thornell MuleStubblefield, Evelia Waskey G, CRNA Oxygen Delivery Method: Simple face mask

## 2017-09-21 NOTE — Op Note (Signed)
NAME: Angela Harrison, JOHNDROW MEDICAL RECORD RU:[id number] ACCOUNT [id number] DATE OF BIRTH:[birth year] FACILITY: [facility] LOCATION: [facility] PHYSICIAN:Imaan Padgett Aretha Parrot, MD  OPERATIVE REPORT  DATE OF PROCEDURE:  09/21/2017  PREOPERATIVE DIAGNOSES:  Primary osteoarthritis and degenerative joint disease, right hip, in a patient with a history of Ehlers-Danlos syndrome with a history of a right hip dislocation.  POSTOPERATIVE DIAGNOSIS:  Primary osteoarthritis and degenerative joint disease, right hip, in a patient with a history of Ehlers-Danlos syndrome with a history of a right hip dislocation.  PROCEDURE PERFORMED:  Right total hip arthroplasty, direct anterior approach.  IMPLANTS:  DePuy Sector Gription acetabular component size 54 with a single screw, size 36+0 neutral polyethylene liner, size 12 Corail femoral component with varus offset, size 36+5 ceramic hip ball.  SURGEON:  Vanita Panda. Magnus Ivan, MD  ASSISTANT:  Rexene Edison, PA-C   ANESTHESIA:  Spinal.  ANTIBIOTICS:  900 mg IV clindamycin.  ESTIMATED BLOOD LOSS:  350 mL.  COMPLICATIONS:  None.  INDICATIONS:  The patient is a 42 year old female with Ehlers-Danlos syndrome.  This has affected her connective tissues, and she has had significant joint issues.  She has had a history of dislocations of her shoulders, and she has a left total hip  arthroplasty that was done in 2017.  Her right hip has dislocated remotely.  She has now developed worsening arthritis in that right hip, and x-rays confirmed this.  At this point, given her daily pain and her detrimental effects her pain has had on  activities of daily living, her quality of life and mobility, she does wish to proceed with a total hip arthroplasty on the right side.  She understands fully the risk of acute blood loss anemia, nerve and vascular injury, fracture, infection,  dislocation, DVT.  She understands her goals are to decrease pain, improve mobility and overall  improve quality of life.  DESCRIPTION OF PROCEDURE:  After informed consent was obtained, appropriate right hip was marked.  She was brought to the operating room where spinal anesthesia was obtained while she was on a stretcher.  She was laid in the supine position on the  stretcher.  A Foley catheter was placed, and I assessed her leg lengths and found them to be equal.  Traction boots were placed on both of her feet.  Next, she was placed supine on the Hana fracture table.  A perineal post was in placed with both legs in  an in-line skeletal traction device, and no traction was applied.  Her right operative hip was prepped and draped with DuraPrep and sterile drapes.  A timeout was called.  She was identified as correct patient, correct right hip.  I then made an  incision just inferior and posterior to the anterior superior iliac spine and carried this obliquely down the leg.  We dissected down tensor fascia lata muscle.  Tensor fascia was then divided longitudinally to proceed with direct anterior approach to  the hip.  We identified and cauterized the circumflex vessels.  I then identified the hip capsule, opened the capsule in an L-type format, finding a moderate joint effusion and significant arthritis throughout her right femoral head.  We placed a curved  retractor on the lateral medial femoral neck, and then we made our femoral neck cut with an oscillating saw just proximal to the lesser trochanter, completing this with an osteotome.  We placed the corkscrew guide in the femoral head and removed the  femoral head in its entirety and found a large area devoid  of cartilage.  I then placed a bent Hohmann into the medial acetabular rim and removed remnants of the acetabular labrum and other debris.  We then began reaming in stepwise increments from a  size 44 reamer, going all the way up to size 53 with all reamers under direct visualization, the last reamer under direct fluoroscopy so we could  obtain our depth in reaming by inclination and anteversion.  I then placed the real DePuy Sector Gription  size 7, component size 54, and a single screw and a 36+0 neutral polyethylene liner for that size 54 acetabular component.  Attention was then turned to the femur.  With the leg externally rotated to 120 degrees, extended and adducted, we were able to  place a Mueller retractor medially and a Hohmann retractor behind the greater trochanter, release the lateral joint capsule and use a box-cutting osteotome and enter the femoral canal and a rongeur to lateralize.  We then began broaching using the Corail  broaching system from a size 8, going up to a size 12.  With the size 12 in place, we tried a varus offset femoral neck based on her anatomy and a 36+1.5 hip ball.  We reduced this in the acetabulum, and it was stable.  We definitely needed a little bit  more leg length.  We dislocated the hip and removed the trial components.  I then placed the real Corail femoral component size 12 with varus offset and the real 36+5 ceramic hip ball.  We reduced this in the acetabulum.  We appreciated the stability  and leg length as well as range of motion.  We then irrigated the soft tissues with normal saline solution.  We were able to close the joint capsule with interrupted #1 Ethibond suture, followed by #1 Vicryl in tensor fascia, 0 Vicryl in the deep tissue,  2-0 Vicryl subcutaneous tissue, 4-0 Monocryl subcuticular stitch and Steri-Strips on the skin.  Aquacel dressing was applied.  She was taken off the Hana table and taken to recovery room in stable condition.  All final counts were correct.  There were  no complications noted.  Of note, Rexene EdisonGil Clark, PA-C, assisted the entire case.  His assistance was crucial for facilitating all aspects of this case.  LN/NUANCE  D:09/21/2017 T:09/21/2017 JOB:[id number]

## 2017-09-21 NOTE — Anesthesia Procedure Notes (Signed)
Spinal  Patient location during procedure: OR Start time: 09/21/2017 10:16 AM End time: 09/21/2017 10:19 AM Staffing Anesthesiologist: Beryle LatheBrock, Thomas E, MD Performed: anesthesiologist  Preanesthetic Checklist Completed: patient identified, surgical consent, pre-op evaluation, timeout performed, IV checked, risks and benefits discussed and monitors and equipment checked Spinal Block Patient position: sitting Prep: DuraPrep Patient monitoring: heart rate, cardiac monitor, continuous pulse ox and blood pressure Approach: midline Location: L3-4 Injection technique: single-shot Needle Needle type: Pencan  Needle gauge: 24 G Additional Notes Functioning IV was confirmed and monitors were applied. Sterile prep and drape, including hand hygiene, mask, and sterile gloves were used. The patient was positioned and the spine was prepped. The skin was anesthetized with lidocaine. Free flow of clear CSF was obtained prior to injecting local anesthetic into the CSF. The spinal needle aspirated freely following injection. The needle was carefully withdrawn. The patient tolerated the procedure well. Consent was obtained prior to the procedure with all questions answered and concerns addressed. Risks including, but not limited to, bleeding, infection, nerve damage, paralysis, failed block, inadequate analgesia, allergic reaction, high spinal, itching, and headache were discussed and the patient wished to proceed.  Leslye Peerhomas Brock, MD

## 2017-09-22 LAB — CBC
HCT: 29.1 % — ABNORMAL LOW (ref 36.0–46.0)
Hemoglobin: 9.2 g/dL — ABNORMAL LOW (ref 12.0–15.0)
MCH: 24.3 pg — ABNORMAL LOW (ref 26.0–34.0)
MCHC: 31.6 g/dL (ref 30.0–36.0)
MCV: 76.8 fL — ABNORMAL LOW (ref 78.0–100.0)
Platelets: 353 10*3/uL (ref 150–400)
RBC: 3.79 MIL/uL — ABNORMAL LOW (ref 3.87–5.11)
RDW: 17 % — ABNORMAL HIGH (ref 11.5–15.5)
WBC: 9.5 10*3/uL (ref 4.0–10.5)

## 2017-09-22 LAB — BASIC METABOLIC PANEL
Anion gap: 5 (ref 5–15)
BUN: 9 mg/dL (ref 6–20)
CO2: 27 mmol/L (ref 22–32)
Calcium: 8.5 mg/dL — ABNORMAL LOW (ref 8.9–10.3)
Chloride: 105 mmol/L (ref 98–111)
Creatinine, Ser: 0.67 mg/dL (ref 0.44–1.00)
GFR calc Af Amer: >60 mL/min (ref >60)
GFR calc non Af Amer: >60 mL/min (ref >60)
Glucose, Bld: 122 mg/dL — ABNORMAL HIGH (ref 70–99)
Potassium: 3.3 mmol/L — ABNORMAL LOW (ref 3.5–5.1)
Sodium: 137 mmol/L (ref 135–145)

## 2017-09-22 MED ORDER — ASPIRIN 81 MG PO CHEW: 81.0000 mg | CHEWABLE_TABLET | Freq: Two times a day (BID) | ORAL | 0 refills | Status: DC

## 2017-09-22 MED ORDER — OXYCODONE HCL 5 MG PO TABS: 5.0000 mg | ORAL_TABLET | ORAL | 0 refills | Status: DC | PRN

## 2017-09-22 MED ORDER — POTASSIUM CHLORIDE CRYS ER 20 MEQ PO TBCR
20.0000 meq | EXTENDED_RELEASE_TABLET | Freq: Every day | ORAL | Status: DC
  Administered 2017-09-22 – 2017-09-24 (×3): 20 meq via ORAL
  Filled 2017-09-22 (×3): qty 1

## 2017-09-22 MED ORDER — METHOCARBAMOL 750 MG PO TABS: 750.0000 mg | ORAL_TABLET | Freq: Four times a day (QID) | ORAL | 0 refills | Status: DC | PRN

## 2017-09-22 NOTE — Progress Notes (Addendum)
     Subjective: 1 Day Post-Op Procedure(s) (LRB): RIGHT TOTAL HIP ARTHROPLASTY ANTERIOR APPROACH (Right) Awakens easily, somulent with sedation. 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 Foley out and voiding. Some right knee discomfort.   Patient reports pain as moderate.    Objective:   VITALS:  Temp:  [97.2 F (36.2 C)-97.7 F (36.5 C)] 97.2 F (36.2 C) (06/29 0646) Pulse Rate:  [45-69] 60 (06/29 0646) Resp:  [12-18] 17 (06/29 0126) BP: (92-124)/(54-84) 96/54 (06/29 0646) SpO2:  [96 %-100 %] 100 % (06/29 0646)  Neurologically intact ABD soft Neurovascular intact Sensation intact distally Intact pulses distally Dorsiflexion/Plantar flexion intact Incision: scant drainage Compartment soft Right knee no effusion but tender to palpation anteromedial, no instabiltiy.   LABS Recent Labs    09/22/17 0501  HGB 9.2*  WBC 9.5  PLT 353   Recent Labs    09/22/17 0501  NA 137  K 3.3*  CL 105  CO2 27  BUN 9  CREATININE 0.67  GLUCOSE 122*   No results for input(s): LABPT, INR in the last 72 hours.   Assessment/Plan: 1 Day Post-Op Procedure(s) (LRB): RIGHT TOTAL HIP ARTHROPLASTY ANTERIOR APPROACH (Right)  Right knee pain, sprain vs hip pain referred to knee. Low potassium on BMET.  Advance diet Up with therapy  Decrease narcotics and muscle relaxer Right knee pain may be hip pain referred to knee, or due to manipulation of the knee during surgery. Recommend ice pack.  Give  KClor 20 meq daily.   Angela Harrison 09/22/2017, 9:19 AMPatient ID: Angela Harrison, female   DOB: 10/01/1975, 42 y.o.   MRN: [id number]

## 2017-09-22 NOTE — Discharge Instructions (Signed)

## 2017-09-22 NOTE — Progress Notes (Signed)
Physical Therapy Treatment Patient Details Name: Angela BrilliantJennifer F Kuipers MRN: [id number] DOB: 07/31/1975 Today's Date: 09/22/2017    History of Present Illness Pt s/p R THR and with hx of L THR, L ankle fx, and R tibia fx    PT Comments    Pt continues motivated and progressing with mobility but struggling with pain control.   Follow Up Recommendations  Follow surgeon's recommendation for DC plan and follow-up therapies     Equipment Recommendations  None recommended by PT    Recommendations for Other Services       Precautions / Restrictions Precautions Precautions: Fall Restrictions Weight Bearing Restrictions: No RLE Weight Bearing: Weight bearing as tolerated    Mobility  Bed Mobility Overal bed mobility: Needs Assistance Bed Mobility: Supine to Sit;Sit to Supine     Supine to sit: Min assist Sit to supine: Min assist   General bed mobility comments: cues for sequence and use of L LE to self assist  Transfers Overall transfer level: Needs assistance Equipment used: Rolling walker (2 wheeled) Transfers: Sit to/from Stand Sit to Stand: Min assist;Min guard;From elevated surface         General transfer comment: Increased assist from low chair 2* knee buckling  Ambulation/Gait Ambulation/Gait assistance: Min guard Gait Distance (Feet): 140 Feet Assistive device: Rolling walker (2 wheeled) Gait Pattern/deviations: Step-to pattern;Decreased step length - right;Decreased step length - left;Shuffle;Trunk flexed Gait velocity: decr   General Gait Details: min cues for posture, position from RW and initial sequence   Stairs             Wheelchair Mobility    Modified Rankin (Stroke Patients Only)       Balance                                            Cognition Arousal/Alertness: Awake/alert Behavior During Therapy: WFL for tasks assessed/performed Overall Cognitive Status: Within Functional Limits for tasks assessed                                         Exercises Total Joint Exercises Ankle Circles/Pumps: AROM;Both;15 reps;Supine Quad Sets: AROM;Both;10 reps;Supine Heel Slides: AAROM;Right;15 reps;Supine Hip ABduction/ADduction: AAROM;Right;10 reps;Supine    General Comments        Pertinent Vitals/Pain Pain Assessment: 0-10 Pain Score: 8  Pain Location: R hip Pain Descriptors / Indicators: Aching;Sore Pain Intervention(s): Limited activity within patient's tolerance;Monitored during session;Premedicated before session;Patient requesting pain meds-RN notified;Ice applied    Home Living                      Prior Function            PT Goals (current goals can now be found in the care plan section) Acute Rehab PT Goals Patient Stated Goal: Regain IND PT Goal Formulation: With patient Time For Goal Achievement: 09/28/17 Potential to Achieve Goals: Good Progress towards PT goals: Progressing toward goals    Frequency    7X/week      PT Plan Current plan remains appropriate    Co-evaluation              AM-PAC PT "6 Clicks" Daily Activity  Outcome Measure  Difficulty turning over in bed (including adjusting bedclothes, sheets and blankets)?: Unable  Difficulty moving from lying on back to sitting on the side of the bed? : Unable Difficulty sitting down on and standing up from a chair with arms (e.g., wheelchair, bedside commode, etc,.)?: Unable Help needed moving to and from a bed to chair (including a wheelchair)?: A Little Help needed walking in hospital room?: A Little Help needed climbing 3-5 steps with a railing? : A Lot 6 Click Score: 11    End of Session Equipment Utilized During Treatment: Gait belt Activity Tolerance: Patient tolerated treatment well;Patient limited by pain Patient left: in bed;with call bell/phone within reach;with family/visitor present Nurse Communication: Mobility status PT Visit Diagnosis: Difficulty in walking, not  elsewhere classified (R26.2)     Time: 1610-9604 PT Time Calculation (min) (ACUTE ONLY): 26 min  Charges:  $Gait Training: 23-37 mins $Therapeutic Exercise: 8-22 mins                    G Codes:       Pg [phone]    Cadie Sorci 09/22/2017, 1:50 PM

## 2017-09-22 NOTE — Progress Notes (Signed)
Physical Therapy Treatment Patient Details Name: Angela Harrison MRN: [id number] DOB: 21-Nov-1975 Today's Date: 09/22/2017    History of Present Illness Pt s/p R THR and with hx of L THR, L ankle fx, and R tibia fx    PT Comments    Pt cooperative and progressing with mobility despite pain control issues.   Follow Up Recommendations  Follow surgeon's recommendation for DC plan and follow-up therapies     Equipment Recommendations  None recommended by PT    Recommendations for Other Services       Precautions / Restrictions Precautions Precautions: Fall Restrictions Weight Bearing Restrictions: No RLE Weight Bearing: Weight bearing as tolerated    Mobility  Bed Mobility Overal bed mobility: Needs Assistance Bed Mobility: Sit to Supine       Sit to supine: Min assist   General bed mobility comments: cues for sequence and use of L LE to self assist  Transfers Overall transfer level: Needs assistance Equipment used: Rolling walker (2 wheeled) Transfers: Sit to/from Stand Sit to Stand: Mod assist         General transfer comment: Increased assist from low chair 2* knee buckling  Ambulation/Gait Ambulation/Gait assistance: Min assist;Min guard Gait Distance (Feet): 123 Feet Assistive device: Rolling walker (2 wheeled) Gait Pattern/deviations: Step-to pattern;Decreased step length - right;Decreased step length - left;Shuffle;Trunk flexed Gait velocity: decr   General Gait Details: min cues for posture, position from RW and initial sequence   Stairs             Wheelchair Mobility    Modified Rankin (Stroke Patients Only)       Balance                                            Cognition Arousal/Alertness: Awake/alert Behavior During Therapy: WFL for tasks assessed/performed Overall Cognitive Status: Within Functional Limits for tasks assessed                                        Exercises Total Joint  Exercises Ankle Circles/Pumps: AROM;Both;15 reps;Supine Quad Sets: AROM;Both;10 reps;Supine Heel Slides: AAROM;Right;15 reps;Supine Hip ABduction/ADduction: AAROM;Right;10 reps;Supine    General Comments        Pertinent Vitals/Pain Pain Assessment: 0-10 Pain Score: 8  Pain Location: R hip Pain Descriptors / Indicators: Aching;Sore Pain Intervention(s): Limited activity within patient's tolerance;Monitored during session;Premedicated before session;Ice applied    Home Living                      Prior Function            PT Goals (current goals can now be found in the care plan section) Acute Rehab PT Goals Patient Stated Goal: Regain IND PT Goal Formulation: With patient Time For Goal Achievement: 09/28/17 Potential to Achieve Goals: Good Progress towards PT goals: Progressing toward goals    Frequency    7X/week      PT Plan Current plan remains appropriate    Co-evaluation              AM-PAC PT "6 Clicks" Daily Activity  Outcome Measure  Difficulty turning over in bed (including adjusting bedclothes, sheets and blankets)?: Unable Difficulty moving from lying on back to sitting on the side of the  bed? : Unable Difficulty sitting down on and standing up from a chair with arms (e.g., wheelchair, bedside commode, etc,.)?: Unable Help needed moving to and from a bed to chair (including a wheelchair)?: A Little Help needed walking in hospital room?: A Little Help needed climbing 3-5 steps with a railing? : A Lot 6 Click Score: 11    End of Session Equipment Utilized During Treatment: Gait belt Activity Tolerance: Patient tolerated treatment well;Patient limited by pain Patient left: in bed;with call bell/phone within reach;with family/visitor present Nurse Communication: Mobility status PT Visit Diagnosis: Difficulty in walking, not elsewhere classified (R26.2)     Time: [id number] PT Time Calculation (min) (ACUTE ONLY): 30 min  Charges:   $Gait Training: 8-22 mins $Therapeutic Exercise: 8-22 mins                    G Codes:       Pg [phone]    Neamiah Sciarra 09/22/2017, 12:29 PM

## 2017-09-23 LAB — BASIC METABOLIC PANEL
Anion gap: 3 — ABNORMAL LOW (ref 5–15)
BUN: 5 mg/dL — ABNORMAL LOW (ref 6–20)
CO2: 28 mmol/L (ref 22–32)
Calcium: 8.5 mg/dL — ABNORMAL LOW (ref 8.9–10.3)
Chloride: 108 mmol/L (ref 98–111)
Creatinine, Ser: 0.71 mg/dL (ref 0.44–1.00)
GFR calc Af Amer: >60 mL/min (ref >60)
GFR calc non Af Amer: >60 mL/min (ref >60)
Glucose, Bld: 119 mg/dL — ABNORMAL HIGH (ref 70–99)
Potassium: 4.4 mmol/L (ref 3.5–5.1)
Sodium: 139 mmol/L (ref 135–145)

## 2017-09-23 LAB — CBC
HCT: 29 % — ABNORMAL LOW (ref 36.0–46.0)
Hemoglobin: 9.1 g/dL — ABNORMAL LOW (ref 12.0–15.0)
MCH: 24.1 pg — ABNORMAL LOW (ref 26.0–34.0)
MCHC: 31.4 g/dL (ref 30.0–36.0)
MCV: 76.7 fL — ABNORMAL LOW (ref 78.0–100.0)
Platelets: 284 10*3/uL (ref 150–400)
RBC: 3.78 MIL/uL — ABNORMAL LOW (ref 3.87–5.11)
RDW: 17.2 % — ABNORMAL HIGH (ref 11.5–15.5)
WBC: 8.3 10*3/uL (ref 4.0–10.5)

## 2017-09-23 NOTE — Care Management Note (Signed)
Case Management Note  Patient Details  Name: Angela Harrison MRN: [id number] Date of Birth: 09/28/1975  Subjective/Objective:   Right THR                 Action/Plan: [facility] agreeable to agency arranged by surgeon's office, [facility]. Pt has RW and 3n1 at home. She has husband and family at home to assist with her care.   Expected Discharge Date:  09/23/17               Expected Discharge Plan:  Home w Home Health Services  In-House Referral:  NA  Discharge planning Services  CM Consult  Post Acute Care Choice:  Home Health Choice offered to:  Patient  DME Arranged:  N/A DME Agency:  NA  HH Arranged:  PT HH Agency:  [facility]  Status of Service:  Completed, signed off  If discussed at [employer] Meetings, dates discussed:    Additional Comments:  [name], [name], RN 09/23/2017, 9:40 AM

## 2017-09-23 NOTE — Progress Notes (Signed)
Physical Therapy Treatment Patient Details Name: Angela BrilliantJennifer F Harrison MRN: [id number] DOB: 09/11/1975 Today's Date: 09/23/2017    History of Present Illness Pt s/p R THR and with hx of L THR, L ankle fx, and R tibia fx    PT Comments    Pt progressing slowly with mobility and limited by c/o pain.   Follow Up Recommendations  Follow surgeon's recommendation for DC plan and follow-up therapies     Equipment Recommendations  None recommended by PT    Recommendations for Other Services       Precautions / Restrictions Precautions Precautions: Fall Restrictions Weight Bearing Restrictions: No RLE Weight Bearing: Weight bearing as tolerated    Mobility  Bed Mobility Overal bed mobility: Needs Assistance Bed Mobility: Supine to Sit     Supine to sit: Min assist Sit to supine: Min assist   General bed mobility comments: cues for sequence and use of L LE to self assist  Transfers Overall transfer level: Needs assistance Equipment used: Rolling walker (2 wheeled) Transfers: Sit to/from Stand Sit to Stand: Min guard;From elevated surface            Ambulation/Gait Ambulation/Gait assistance: Min guard Gait Distance (Feet): 150 Feet(and to/from bathroom) Assistive device: Rolling walker (2 wheeled) Gait Pattern/deviations: Step-to pattern;Decreased step length - right;Decreased step length - left;Shuffle;Trunk flexed Gait velocity: decr   General Gait Details: min cues for posture, and position from RW   Stairs Stairs: Yes Stairs assistance: Min assist Stair Management: One rail Left;Forwards;With crutches;Step to pattern Number of Stairs: 2 General stair comments: min cues for sequence and foot/crutch placement   Wheelchair Mobility    Modified Rankin (Stroke Patients Only)       Balance                                            Cognition Arousal/Alertness: Awake/alert Behavior During Therapy: WFL for tasks  assessed/performed Overall Cognitive Status: Within Functional Limits for tasks assessed                                        Exercises      General Comments        Pertinent Vitals/Pain Pain Assessment: 0-10 Pain Score: 7  Pain Location: R hip Pain Descriptors / Indicators: Aching;Sore Pain Intervention(s): Limited activity within patient's tolerance;Monitored during session;Premedicated before session    Home Living                      Prior Function            PT Goals (current goals can now be found in the care plan section) Acute Rehab PT Goals Patient Stated Goal: Regain IND PT Goal Formulation: With patient Time For Goal Achievement: 09/28/17 Potential to Achieve Goals: Good Progress towards PT goals: Progressing toward goals    Frequency    7X/week      PT Plan Current plan remains appropriate    Co-evaluation              AM-PAC PT "6 Clicks" Daily Activity  Outcome Measure  Difficulty turning over in bed (including adjusting bedclothes, sheets and blankets)?: Unable Difficulty moving from lying on back to sitting on the side of the bed? : Unable Difficulty sitting  down on and standing up from a chair with arms (e.g., wheelchair, bedside commode, etc,.)?: Unable Help needed moving to and from a bed to chair (including a wheelchair)?: A Little Help needed walking in hospital room?: A Little Help needed climbing 3-5 steps with a railing? : A Little 6 Click Score: 12    End of Session Equipment Utilized During Treatment: Gait belt Activity Tolerance: Patient tolerated treatment well;Patient limited by pain Patient left: in chair;with call bell/phone within reach;with family/visitor present Nurse Communication: Mobility status PT Visit Diagnosis: Difficulty in walking, not elsewhere classified (R26.2)     Time: 1610-9604 PT Time Calculation (min) (ACUTE ONLY): 23 min  Charges:  $Gait Training: 8-22  mins $Therapeutic Activity: 8-22 mins                    G Codes:       Pg [phone]    Hjalmar Ballengee 09/23/2017, 3:43 PM

## 2017-09-23 NOTE — Progress Notes (Signed)
OT Cancellation Note  Patient Details Name: Angela Harrison MRN: [id number] DOB: 12/24/1975   Cancelled Treatment:    Reason Eval/Treat Not Completed: OT screened, no needs identified, will sign off. Spoke with pt and she is very familiar with ADL techniques from her previous surgeries and has all DME. She declines need for OT eval at this time.  Zannie KehrStephanie S Harwood Nall 09/23/2017, 10:41 AM

## 2017-09-23 NOTE — Progress Notes (Signed)
     Subjective: 2 Days Post-Op Procedure(s) (LRB): RIGHT TOTAL HIP ARTHROPLASTY ANTERIOR APPROACH (Right) Awake, alert and oriented x 4. Dressing intact, 3 small tiny  Stable areas of blood stain. No BM. Pain levels increased today. Patient reports pain as moderate.    Objective:   VITALS:  Temp:  [97.6 F (36.4 C)-98.7 F (37.1 C)] 98.7 F (37.1 C) (06/30 0620) Pulse Rate:  [61-86] 86 (06/30 0620) Resp:  [15-16] 16 (06/30 0620) BP: (93-115)/(60-83) 115/83 (06/30 0620) SpO2:  [96 %-100 %] 96 % (06/30 0620)  Neurologically intact ABD soft Neurovascular intact Sensation intact distally Intact pulses distally Dorsiflexion/Plantar flexion intact Incision: dressing C/D/I and no drainage   LABS Recent Labs    09/22/17 0501 09/23/17 0436  HGB 9.2* 9.1*  WBC 9.5 8.3  PLT 353 284   Recent Labs    09/22/17 0501 09/23/17 0436  NA 137 139  K 3.3* 4.4  CL 105 108  CO2 27 28  BUN 9 5*  CREATININE 0.67 0.71  GLUCOSE 122* 119*   No results for input(s): LABPT, INR in the last 72 hours.   Assessment/Plan: 2 Days Post-Op Procedure(s) (LRB): RIGHT TOTAL HIP ARTHROPLASTY ANTERIOR APPROACH (Right)  Advance diet Up with therapy D/C IV fluids Plan for discharge tomorrow Discharge home with home health  Vira BrownsJames Nitka 09/23/2017, 9:19 AMPatient ID: Angela BrilliantJennifer F Geisel, female   DOB: 09/09/1975, 42 y.o.   MRN: [id number]

## 2017-09-23 NOTE — Progress Notes (Signed)
Physical Therapy Treatment Patient Details Name: Angela Harrison MRN: [id number] DOB: 05/21/1975 Today's Date: 09/23/2017    History of Present Illness Pt s/p R THR and with hx of L THR, L ankle fx, and R tibia fx    PT Comments    Pt continues cooperative but struggling with pain control.   Follow Up Recommendations  Follow surgeon's recommendation for DC plan and follow-up therapies     Equipment Recommendations  None recommended by PT    Recommendations for Other Services       Precautions / Restrictions Precautions Precautions: Fall Restrictions Weight Bearing Restrictions: No RLE Weight Bearing: Weight bearing as tolerated    Mobility  Bed Mobility Overal bed mobility: Needs Assistance Bed Mobility: Supine to Sit;Sit to Supine     Supine to sit: Min assist Sit to supine: Min assist   General bed mobility comments: cues for sequence and use of L LE to self assist  Transfers Overall transfer level: Needs assistance Equipment used: Rolling walker (2 wheeled) Transfers: Sit to/from Stand Sit to Stand: Min guard;From elevated surface            Ambulation/Gait Ambulation/Gait assistance: Min guard Gait Distance (Feet): 150 Feet(and 15' twice to/from bathroom) Assistive device: Rolling walker (2 wheeled) Gait Pattern/deviations: Step-to pattern;Decreased step length - right;Decreased step length - left;Shuffle;Trunk flexed Gait velocity: decr   General Gait Details: min cues for posture, and position from RW   Stairs Stairs: Yes Stairs assistance: Min assist Stair Management: One rail Left;Forwards;With crutches;Step to pattern Number of Stairs: 2 General stair comments: min cues for sequence and foot/crutch placement   Wheelchair Mobility    Modified Rankin (Stroke Patients Only)       Balance                                            Cognition Arousal/Alertness: Awake/alert Behavior During Therapy: WFL for tasks  assessed/performed Overall Cognitive Status: Within Functional Limits for tasks assessed                                        Exercises      General Comments        Pertinent Vitals/Pain Pain Assessment: 0-10 Pain Score: 7  Pain Location: R hip Pain Descriptors / Indicators: Aching;Sore Pain Intervention(s): Limited activity within patient's tolerance;Monitored during session;Premedicated before session;Ice applied    Home Living                      Prior Function            PT Goals (current goals can now be found in the care plan section) Acute Rehab PT Goals Patient Stated Goal: Regain IND PT Goal Formulation: With patient Time For Goal Achievement: 09/28/17 Potential to Achieve Goals: Good Progress towards PT goals: Progressing toward goals    Frequency    7X/week      PT Plan Current plan remains appropriate    Co-evaluation              AM-PAC PT "6 Clicks" Daily Activity  Outcome Measure  Difficulty turning over in bed (including adjusting bedclothes, sheets and blankets)?: Unable Difficulty moving from lying on back to sitting on the side of the bed? :  Unable Difficulty sitting down on and standing up from a chair with arms (e.g., wheelchair, bedside commode, etc,.)?: Unable Help needed moving to and from a bed to chair (including a wheelchair)?: A Little Help needed walking in hospital room?: A Little Help needed climbing 3-5 steps with a railing? : A Little 6 Click Score: 12    End of Session Equipment Utilized During Treatment: Gait belt Activity Tolerance: Patient tolerated treatment well;Patient limited by pain Patient left: in bed;with call bell/phone within reach;with family/visitor present Nurse Communication: Mobility status PT Visit Diagnosis: Difficulty in walking, not elsewhere classified (R26.2)     Time: 1022-1050 PT Time Calculation (min) (ACUTE ONLY): 28 min  Charges:  $Gait Training: 8-22  mins $Therapeutic Activity: 8-22 mins                    G Codes:       Pg [phone]    Angela Harrison 09/23/2017, 1:59 PM

## 2017-09-24 ENCOUNTER — Encounter (HOSPITAL_COMMUNITY): Payer: Self-pay | Admitting: Orthopaedic Surgery

## 2017-09-24 NOTE — Discharge Summary (Signed)
Patient ID: Angela BrilliantJennifer F Harrison MRN: [id number] DOB/AGE: 43/03/1975 43 y.o.  Admit date: 09/21/2017 Discharge date: 09/24/2017  Admission Diagnoses:  Principal Problem:   Unilateral primary osteoarthritis, right hip Active Problems:   Status post total replacement of right hip   Discharge Diagnoses:  Same  Past Medical History:  Diagnosis Date  . Anemia    PMH  . Arthritis    oa  . 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 Ehlers-Danlos syndrome type III   . Gastric paresis   . Migraine    migraines  . Syncope    neurocardiogenic takes toprol and zoloft for  . Vitamin D deficiency 12/16/2016    Surgeries: Procedure(s): RIGHT TOTAL HIP ARTHROPLASTY ANTERIOR APPROACH on 09/21/2017   Consultants:   Discharged Condition: Improved  Hospital Course: Angela BrilliantJennifer F Effertz is an 42 y.o. female who was admitted 09/21/2017 for operative treatment ofUnilateral primary osteoarthritis, right hip. Patient has severe unremitting pain that affects sleep, daily activities, and work/hobbies. After pre-op clearance the patient was taken to the operating room on 09/21/2017 and underwent  Procedure(s): RIGHT TOTAL HIP ARTHROPLASTY ANTERIOR APPROACH.    Patient was given perioperative antibiotics:  Anti-infectives (From admission, onward)   Start     Dose/Rate Route Frequency Ordered Stop   09/21/17 1600  clindamycin (CLEOCIN) IVPB 600 mg     600 mg 100 mL/hr over 30 Minutes Intravenous Every 6 hours 09/21/17 1308 09/21/17 2159   09/21/17 0915  clindamycin (CLEOCIN) IVPB 900 mg     900 mg 100 mL/hr over 30 Minutes Intravenous  Once 09/21/17 0905 09/21/17 1050   09/21/17 0830  ceFAZolin (ANCEF) IVPB 2g/100 mL premix  Status:  Discontinued     2 g 200 mL/hr over 30 Minutes Intravenous On call to O.R. 09/21/17 0825 09/21/17 0905       Patient was given sequential compression devices, early ambulation, and chemoprophylaxis to prevent DVT.  Patient benefited  maximally from hospital stay and there were no complications.    Recent vital signs:  Patient Vitals for the past 24 hrs:  BP Temp Temp src Pulse Resp SpO2  09/24/17 0645 101/68 98.5 F (36.9 C) Oral 79 14 100 %  09/23/17 2200 - 99.4 F (37.4 C) Oral - - -  09/23/17 2106 117/71 (!) 101.5 F (38.6 C) Axillary 99 18 99 %  09/23/17 1339 119/79 98.7 F (37.1 C) Oral 85 15 98 %     Recent laboratory studies:  Recent Labs    09/22/17 0501 09/23/17 0436  WBC 9.5 8.3  HGB 9.2* 9.1*  HCT 29.1* 29.0*  PLT 353 284  NA 137 139  K 3.3* 4.4  CL 105 108  CO2 27 28  BUN 9 5*  CREATININE 0.67 0.71  GLUCOSE 122* 119*  CALCIUM 8.5* 8.5*     Discharge Medications:   Allergies as of 09/24/2017      Reactions   Cephalosporins Hives, Swelling, Rash, Other (See Comments)   Can tolerate Penicillin   Cefazolin Hives, Swelling, Rash, Other (See Comments)   Can tolerate Penicillin   Cephalexin Hives, Swelling, Rash, Other (See Comments)   Can tolerate Penicillin      Medication List    STOP taking these medications   HYDROcodone-acetaminophen 5-325 MG tablet Commonly known as:  NORCO/VICODIN     TAKE these medications   acetaminophen 500 MG tablet Commonly known as:  TYLENOL Take 500-1,000 mg by mouth every 8 (eight) hours  as needed for moderate pain.   aspirin 81 MG chewable tablet Chew 1 tablet (81 mg total) by mouth 2 (two) times daily.   docusate sodium 100 MG capsule Commonly known as:  COLACE Take 1 capsule (100 mg total) by mouth 2 (two) times daily.   enoxaparin 40 MG/0.4ML injection Commonly known as:  LOVENOX Inject 0.4 mLs (40 mg total) into the skin daily.   hydrocortisone cream 1 % Apply 1 application topically daily as needed for itching.   IMITREX 6 MG/0.5ML Soln injection Generic drug:  SUMAtriptan Inject 6 mg into the skin every 2 (two) hours as needed for migraine or headache. May repeat in 2 hours if headache persists or recurs.   meclizine 25 MG  tablet Commonly known as:  ANTIVERT Take 25 mg by mouth 3 (three) times daily as needed for dizziness.   methocarbamol 750 MG tablet Commonly known as:  ROBAXIN Take 1 tablet (750 mg total) by mouth every 6 (six) hours as needed for muscle spasms. What changed:  See the new instructions.   metoprolol succinate 50 MG 24 hr tablet Commonly known as:  TOPROL-XL TAKE 1 TABLET (50 MG TOTAL) BY MOUTH AT BEDTIME   ondansetron 8 MG tablet Commonly known as:  ZOFRAN Take 8 mg by mouth every 8 (eight) hours as needed for nausea or vomiting.   oxyCODONE 5 MG immediate release tablet Commonly known as:  Oxy IR/ROXICODONE Take 1-2 tablets (5-10 mg total) by mouth every 4 (four) hours as needed for moderate pain (pain score 4-6).   polyethylene glycol packet Commonly known as:  MIRALAX / GLYCOLAX Take 17 g by mouth daily.   prochlorperazine 10 MG tablet Commonly known as:  COMPAZINE Take 10 mg by mouth every 6 (six) hours as needed for nausea or vomiting.   promethazine 25 MG suppository Commonly known as:  PHENERGAN Place 25 mg rectally every 6 (six) hours as needed for nausea or vomiting.   sertraline 50 MG tablet Commonly known as:  ZOLOFT TAKE 1 TABLET (50 MG TOTAL) BY MOUTH AT BEDTIME            Durable Medical Equipment  (From admission, onward)        Start     Ordered   09/21/17 1308  DME 3 n 1  Once     09/21/17 1308   09/21/17 1308  DME Walker rolling  Once    Question:  Patient needs a walker to treat with the following condition  Answer:  Status post total replacement of right hip   09/21/17 1308      Diagnostic Studies: Dg Pelvis Portable  Result Date: 09/21/2017 CLINICAL DATA:  Status post right hip replacement. EXAM: PORTABLE PELVIS 1-2 VIEWS COMPARISON:  09/21/2017 and 08/22/2017 FINDINGS: Patient now has a total right hip arthroplasty. The right hip arthroplasty appears located on this single view. The entire right femoral stem is visualized. Stable  appearance of the left total hip arthroplasty. Expected soft tissue lucency in the right upper thigh and hip region. IMPRESSION: Expected postoperative changes from a right total hip arthroplasty. Electronically Signed   By: Richarda Overlie M.D.   On: 09/21/2017 12:45   Dg C-arm 1-60 Min-no Report  Result Date: 09/21/2017 Fluoroscopy was utilized by the requesting physician.  No radiographic interpretation.   Dg Hip Operative Unilat W Or W/o Pelvis Right  Result Date: 09/21/2017 CLINICAL DATA:  Hip replacement surgery EXAM: OPERATIVE RIGHT HIP (WITH PELVIS IF PERFORMED) 5 VIEWS TECHNIQUE: Fluoroscopic  spot image(s) were submitted for interpretation post-operatively. COMPARISON:  08/22/2017 FINDINGS: Fluoroscopic spot images document changes of right hip arthroplasty. On final images, femoral and acetabular components project in expected location. Left hip arthroplasty components partially visualized. IMPRESSION: Interval right hip arthroplasty without apparent complication. Electronically Signed   By: Corlis Leak M.D.   On: 09/21/2017 12:27    Disposition: Discharge disposition: 01-Home or Self Care       Discharge Instructions    Discharge patient   Complete by:  As directed    Discharge disposition:  01-Home or Self Care   Discharge patient date:  09/24/2017      Follow-up Information    Kathryne Hitch, MD Follow up in 2 week(s).   Specialty:  Orthopedic Surgery Contact information: [address] [place] [address] [phone]        [facility] Follow up.   Specialty:  Home Health Services Why:  Home Health Physical Therapy-agency will call to arrange appointment Contact information: [address] [place] [address] [phone]            Signed: Kathryne Hitch 09/24/2017, 8:08 AM

## 2017-09-24 NOTE — Progress Notes (Signed)
Pt was noted to have a fever at 2110. Pt given tylenol per order and encouraged to use IS. Teaching done on proper IS use with teach back. Pt fever was down one hour later after follow up check. IS is still encouraged and pt uses properly. Jackelyn Knifeanielle Omran Keelin, RN

## 2017-09-24 NOTE — Progress Notes (Signed)
Patient ID: Angela Harrison, female   DOB: 08/27/1975, 42 y.o.   MRN: [id number] Doing well overall.  Can go home today.

## 2017-09-24 NOTE — Progress Notes (Signed)
Physical Therapy Treatment Patient Details Name: Angela BrilliantJennifer F Harrison MRN: [id number] DOB: 05/10/1975 Today's Date: 09/24/2017    History of Present Illness Pt s/p R THR and with hx of L THR, L ankle fx, and R tibia fx    PT Comments    Progressing with mobility. Pain rated 7/10 during session. Reviewed exercises and gait training. Pt stated she feels comfortable with stair negotiation (practiced yesterday). Plan is for HHPT f/u. All education completed. Okay to d/c from PT standpoint-RN aware.     Follow Up Recommendations  Follow surgeon's recommendation for DC plan and follow-up therapies     Equipment Recommendations  None recommended by PT    Recommendations for Other Services       Precautions / Restrictions Precautions Precautions: Fall Restrictions Weight Bearing Restrictions: No RLE Weight Bearing: Weight bearing as tolerated    Mobility  Bed Mobility   Bed Mobility: Supine to Sit;Sit to Supine     Supine to sit: Min assist;HOB elevated Sit to supine: Min assist;HOB elevated   General bed mobility comments: Assist for R LE. Increased time.   Transfers Overall transfer level: Needs assistance Equipment used: Rolling walker (2 wheeled) Transfers: Sit to/from Stand Sit to Stand: Min guard         General transfer comment: close guard for safety. VCs safety, hand placement. Increased time.   Ambulation/Gait Ambulation/Gait assistance: Min guard Gait Distance (Feet): 120 Feet Assistive device: Rolling walker (2 wheeled) Gait Pattern/deviations: Step-to pattern;Decreased step length - right;Decreased step length - left;Shuffle;Trunk flexed     General Gait Details: close guard for safety. slow gait speed.    Stairs             Wheelchair Mobility    Modified Rankin (Stroke Patients Only)       Balance                                            Cognition Arousal/Alertness: Awake/alert Behavior During Therapy: WFL for  tasks assessed/performed Overall Cognitive Status: Within Functional Limits for tasks assessed                                        Exercises Total Joint Exercises Ankle Circles/Pumps: AROM;Both;Supine;10 reps Quad Sets: AROM;Both;10 reps;Supine Heel Slides: AAROM;Right;Supine;10 reps Hip ABduction/ADduction: AAROM;Right;10 reps;Supine    General Comments        Pertinent Vitals/Pain Pain Assessment: 0-10 Pain Score: 7  Pain Location: R hip Pain Descriptors / Indicators: Aching;Sore Pain Intervention(s): Monitored during session;Repositioned;Ice applied;RN gave pain meds during session    Home Living                      Prior Function            PT Goals (current goals can now be found in the care plan section) Progress towards PT goals: Progressing toward goals    Frequency    7X/week      PT Plan Current plan remains appropriate    Co-evaluation              AM-PAC PT "6 Clicks" Daily Activity  Outcome Measure  Difficulty turning over in bed (including adjusting bedclothes, sheets and blankets)?: A Lot Difficulty moving from lying on back to sitting  on the side of the bed? : Unable Difficulty sitting down on and standing up from a chair with arms (e.g., wheelchair, bedside commode, etc,.)?: A Little Help needed moving to and from a bed to chair (including a wheelchair)?: A Little Help needed walking in hospital room?: A Little Help needed climbing 3-5 steps with a railing? : A Little 6 Click Score: 15    End of Session Equipment Utilized During Treatment: Gait belt Activity Tolerance: Patient tolerated treatment well;Patient limited by pain Patient left: in bed;with call bell/phone within reach;with family/visitor present   PT Visit Diagnosis: Difficulty in walking, not elsewhere classified (R26.2);Pain Pain - Right/Left: Right Pain - part of body: Hip     Time: 1610-9604 PT Time Calculation (min) (ACUTE ONLY): 21  min  Charges:  $Gait Training: 8-22 mins                    G Codes:         Rebeca Alert, MPT Pager: [phone]

## 2017-09-24 NOTE — Progress Notes (Signed)
Patient discharged to home with family. Given all belongings, instructions, prescriptions. Patient and family verbalized understanding of all instructions. Escorted to pov via w/c. 

## 2017-09-25 DIAGNOSIS — G43909 Migraine, unspecified, not intractable, without status migrainosus: Secondary | ICD-10-CM | POA: Diagnosis not present

## 2017-09-25 DIAGNOSIS — Z7982 Long term (current) use of aspirin: Secondary | ICD-10-CM | POA: Diagnosis not present

## 2017-09-25 DIAGNOSIS — Q796 Ehlers-Danlos syndrome: Secondary | ICD-10-CM | POA: Diagnosis not present

## 2017-09-25 DIAGNOSIS — Z471 Aftercare following joint replacement surgery: Secondary | ICD-10-CM | POA: Diagnosis not present

## 2017-09-28 ENCOUNTER — Telehealth (INDEPENDENT_AMBULATORY_CARE_PROVIDER_SITE_OTHER): Payer: Self-pay | Admitting: Orthopaedic Surgery

## 2017-09-28 DIAGNOSIS — Q796 Ehlers-Danlos syndrome: Secondary | ICD-10-CM | POA: Diagnosis not present

## 2017-09-28 DIAGNOSIS — Z7982 Long term (current) use of aspirin: Secondary | ICD-10-CM | POA: Diagnosis not present

## 2017-09-28 DIAGNOSIS — Z471 Aftercare following joint replacement surgery: Secondary | ICD-10-CM | POA: Diagnosis not present

## 2017-09-28 DIAGNOSIS — G43909 Migraine, unspecified, not intractable, without status migrainosus: Secondary | ICD-10-CM | POA: Diagnosis not present

## 2017-09-28 MED ORDER — HYDROCODONE-ACETAMINOPHEN 5-325 MG PO TABS: 1.0000 | ORAL_TABLET | ORAL | 0 refills | Status: DC | PRN

## 2017-09-28 NOTE — Telephone Encounter (Signed)
Please advise 

## 2017-09-28 NOTE — Telephone Encounter (Signed)
I sent some in 

## 2017-09-28 NOTE — Telephone Encounter (Signed)
Patient called requesting an RX refill on her Hydrocodone.  CB#[phone].  Thank you.

## 2017-10-02 DIAGNOSIS — G43909 Migraine, unspecified, not intractable, without status migrainosus: Secondary | ICD-10-CM | POA: Diagnosis not present

## 2017-10-02 DIAGNOSIS — Q796 Ehlers-Danlos syndrome: Secondary | ICD-10-CM | POA: Diagnosis not present

## 2017-10-02 DIAGNOSIS — Z96643 Presence of artificial hip joint, bilateral: Secondary | ICD-10-CM | POA: Diagnosis not present

## 2017-10-02 DIAGNOSIS — Z7982 Long term (current) use of aspirin: Secondary | ICD-10-CM | POA: Diagnosis not present

## 2017-10-02 DIAGNOSIS — Z471 Aftercare following joint replacement surgery: Secondary | ICD-10-CM | POA: Diagnosis not present

## 2017-10-04 ENCOUNTER — Encounter (INDEPENDENT_AMBULATORY_CARE_PROVIDER_SITE_OTHER): Payer: Self-pay | Admitting: Orthopaedic Surgery

## 2017-10-04 ENCOUNTER — Ambulatory Visit (INDEPENDENT_AMBULATORY_CARE_PROVIDER_SITE_OTHER): Payer: BLUE CROSS/BLUE SHIELD | Admitting: Orthopaedic Surgery

## 2017-10-04 DIAGNOSIS — G43909 Migraine, unspecified, not intractable, without status migrainosus: Secondary | ICD-10-CM | POA: Diagnosis not present

## 2017-10-04 DIAGNOSIS — Z7982 Long term (current) use of aspirin: Secondary | ICD-10-CM | POA: Diagnosis not present

## 2017-10-04 DIAGNOSIS — Q796 Ehlers-Danlos syndrome: Secondary | ICD-10-CM | POA: Diagnosis not present

## 2017-10-04 DIAGNOSIS — Z96643 Presence of artificial hip joint, bilateral: Secondary | ICD-10-CM | POA: Diagnosis not present

## 2017-10-04 DIAGNOSIS — Z471 Aftercare following joint replacement surgery: Secondary | ICD-10-CM | POA: Diagnosis not present

## 2017-10-04 DIAGNOSIS — Z96641 Presence of right artificial hip joint: Secondary | ICD-10-CM

## 2017-10-04 MED ORDER — OXYCODONE HCL 5 MG PO TABS: 5.0000 mg | ORAL_TABLET | ORAL | 0 refills | Status: DC | PRN

## 2017-10-04 NOTE — Progress Notes (Signed)
The patient is now 2 weeks tomorrow status post a right total hip arthroplasty.  She had significant disease in her hip but only 42 years of age due to chronic dislocations from Ehlers-Danlos syndrome and ligamentous laxity.  She feels like her hip is much more stable overall postoperative.  She is able with a cane and doing well overall.  On exam her incision looks great.  I replaced the Steri-Strips.  There is no significant seroma.  Her ligaments are near equal.  At this point I will refill her oxycodone.  We will see her back in 4 weeks to see how she is doing overall.  She will continue increase her activities as comfort allows.  All questions concerns were answered and addressed.

## 2017-10-05 DIAGNOSIS — Z471 Aftercare following joint replacement surgery: Secondary | ICD-10-CM | POA: Diagnosis not present

## 2017-10-05 DIAGNOSIS — Z7982 Long term (current) use of aspirin: Secondary | ICD-10-CM | POA: Diagnosis not present

## 2017-10-05 DIAGNOSIS — Q796 Ehlers-Danlos syndrome: Secondary | ICD-10-CM | POA: Diagnosis not present

## 2017-10-05 DIAGNOSIS — G43909 Migraine, unspecified, not intractable, without status migrainosus: Secondary | ICD-10-CM | POA: Diagnosis not present

## 2017-10-05 DIAGNOSIS — Z96643 Presence of artificial hip joint, bilateral: Secondary | ICD-10-CM | POA: Diagnosis not present

## 2017-10-12 ENCOUNTER — Telehealth (INDEPENDENT_AMBULATORY_CARE_PROVIDER_SITE_OTHER): Payer: Self-pay | Admitting: Orthopaedic Surgery

## 2017-10-12 MED ORDER — METHOCARBAMOL 750 MG PO TABS: 750.0000 mg | ORAL_TABLET | Freq: Four times a day (QID) | ORAL | 0 refills | Status: DC | PRN

## 2017-10-12 NOTE — Telephone Encounter (Signed)
I sent some in to her pharmacy. 

## 2017-10-12 NOTE — Telephone Encounter (Signed)
Patient requesting rx refill on muscle relaxer. Patients # [phone]

## 2017-10-12 NOTE — Telephone Encounter (Signed)
Please advise 

## 2017-10-12 NOTE — Telephone Encounter (Signed)
Done. Patient aware.

## 2017-10-17 ENCOUNTER — Telehealth (INDEPENDENT_AMBULATORY_CARE_PROVIDER_SITE_OTHER): Payer: Self-pay | Admitting: Orthopaedic Surgery

## 2017-10-17 MED ORDER — HYDROCODONE-ACETAMINOPHEN 5-325 MG PO TABS: 1.0000 | ORAL_TABLET | ORAL | 0 refills | Status: DC | PRN

## 2017-10-17 NOTE — Telephone Encounter (Signed)
Patient called and is requesting a 10 day prescription refill on hydrocodone. She leaves to go on vacation to [place] tomorrow and would like it while she is gone. Patients # [phone]

## 2017-10-17 NOTE — Telephone Encounter (Signed)
I sent some in 

## 2017-10-17 NOTE — Telephone Encounter (Signed)
Please advise 

## 2017-10-18 NOTE — Telephone Encounter (Signed)
Patient aware her medication is ready

## 2017-10-19 ENCOUNTER — Other Ambulatory Visit (INDEPENDENT_AMBULATORY_CARE_PROVIDER_SITE_OTHER): Payer: Self-pay | Admitting: Orthopaedic Surgery

## 2017-10-19 NOTE — Telephone Encounter (Signed)
Continue

## 2017-10-26 ENCOUNTER — Telehealth (INDEPENDENT_AMBULATORY_CARE_PROVIDER_SITE_OTHER): Payer: Self-pay | Admitting: Orthopaedic Surgery

## 2017-10-26 ENCOUNTER — Other Ambulatory Visit (INDEPENDENT_AMBULATORY_CARE_PROVIDER_SITE_OTHER): Payer: Self-pay | Admitting: Orthopaedic Surgery

## 2017-10-26 ENCOUNTER — Other Ambulatory Visit (INDEPENDENT_AMBULATORY_CARE_PROVIDER_SITE_OTHER): Payer: Self-pay | Admitting: Physician Assistant

## 2017-10-26 MED ORDER — HYDROCODONE-ACETAMINOPHEN 5-325 MG PO TABS: 1.0000 | ORAL_TABLET | Freq: Four times a day (QID) | ORAL | 0 refills | Status: DC | PRN

## 2017-10-26 NOTE — Telephone Encounter (Signed)
Medication refill   Methocarbamol (Robaxin)750mg  tablet   Hydrocodone -acetaminophen(Norco/Vicodin)5-325 mg tablet    Patient currently out of meds

## 2017-10-26 NOTE — Telephone Encounter (Signed)
Patient aware this is ready at front desk  

## 2017-10-26 NOTE — Telephone Encounter (Signed)
Doing now.

## 2017-10-26 NOTE — Telephone Encounter (Signed)
Can you advise and fill these for patient, or [facility]?

## 2017-11-01 ENCOUNTER — Encounter (INDEPENDENT_AMBULATORY_CARE_PROVIDER_SITE_OTHER): Payer: Self-pay | Admitting: Physician Assistant

## 2017-11-01 ENCOUNTER — Ambulatory Visit (INDEPENDENT_AMBULATORY_CARE_PROVIDER_SITE_OTHER): Payer: BLUE CROSS/BLUE SHIELD | Admitting: Physician Assistant

## 2017-11-01 DIAGNOSIS — Z96641 Presence of right artificial hip joint: Secondary | ICD-10-CM

## 2017-11-01 MED ORDER — HYDROCODONE-ACETAMINOPHEN 5-325 MG PO TABS: 1.0000 | ORAL_TABLET | Freq: Four times a day (QID) | ORAL | 0 refills | Status: DC | PRN

## 2017-11-01 MED ORDER — OXYCODONE HCL 5 MG PO TABS: 5.0000 mg | ORAL_TABLET | ORAL | 0 refills | Status: DC | PRN

## 2017-11-01 NOTE — Progress Notes (Signed)
HPI: Ms. letter returns today for follow-up of her right hip status post right total hip arthroplasty and now 41 days postop.  She is overall doing better.  She still having some spasm some groin and thigh pain.  She states her range of motion strength are improving.  Again she does see Dr. Modesta Messingramus for pain management and is due to follow-up with him on August 18.  She is asking for refill on her hydrocodone.  Physical exam: General well-developed well-nourished female no acute distress skin. Right hip surgical incisions healing well no signs of infection.  Right calf supple nontender.  Ambulates without any assistive device.  Good range of motion of the right hip without pain.  Dorsiflexion plantarflexion ankle intact.  Impression: 41 days status post right total hip arthroplasty.  Plan: We will have her continue work on range of motion strengthening of the right hip.  Scar tissue mobilization.  See her back in 6 weeks for follow-up.  Refill on her Norco was given.  She will get her pain medication from Dr. Ethelene Halamos most in the future.  She does ask about her left leg where she has some pain distal tibia ankle region status post IM nailing of the distal tibia fracture by Dr. Marcello FennelHande in September 2018.  She is asking for removal of the distal screws as these cause her some discomfort.  She does have some prominence of the screws 1 anterior 1 of the horizontal screws.  We will therefore obtain AP and lateral views of her left tibia next office visit and discussed screw removal with her in more detail.  Questions were encouraged and answered.

## 2017-11-12 DIAGNOSIS — G894 Chronic pain syndrome: Secondary | ICD-10-CM | POA: Diagnosis not present

## 2017-11-19 DIAGNOSIS — Z1231 Encounter for screening mammogram for malignant neoplasm of breast: Secondary | ICD-10-CM | POA: Diagnosis not present

## 2017-11-19 DIAGNOSIS — Z803 Family history of malignant neoplasm of breast: Secondary | ICD-10-CM | POA: Diagnosis not present

## 2017-12-12 ENCOUNTER — Ambulatory Visit (INDEPENDENT_AMBULATORY_CARE_PROVIDER_SITE_OTHER): Payer: BLUE CROSS/BLUE SHIELD | Admitting: Orthopaedic Surgery

## 2017-12-12 ENCOUNTER — Ambulatory Visit (INDEPENDENT_AMBULATORY_CARE_PROVIDER_SITE_OTHER): Payer: BLUE CROSS/BLUE SHIELD

## 2017-12-12 ENCOUNTER — Encounter (INDEPENDENT_AMBULATORY_CARE_PROVIDER_SITE_OTHER): Payer: Self-pay | Admitting: Orthopaedic Surgery

## 2017-12-12 DIAGNOSIS — T8484XA Pain due to internal orthopedic prosthetic devices, implants and grafts, initial encounter: Secondary | ICD-10-CM

## 2017-12-12 DIAGNOSIS — S82402A Unspecified fracture of shaft of left fibula, initial encounter for closed fracture: Secondary | ICD-10-CM

## 2017-12-12 DIAGNOSIS — Z96641 Presence of right artificial hip joint: Secondary | ICD-10-CM

## 2017-12-12 DIAGNOSIS — S82402S Unspecified fracture of shaft of left fibula, sequela: Secondary | ICD-10-CM

## 2017-12-12 DIAGNOSIS — S82202S Unspecified fracture of shaft of left tibia, sequela: Secondary | ICD-10-CM

## 2017-12-12 DIAGNOSIS — S82202A Unspecified fracture of shaft of left tibia, initial encounter for closed fracture: Secondary | ICD-10-CM | POA: Insufficient documentation

## 2017-12-12 NOTE — Progress Notes (Signed)
The patient is now 82 days status post a right total hip arthroplasty.  Her right hip is doing well.  She does have a little bit of groin pain to be expected.  Were also seeing her today for her left leg.  She actually had a tibia fracture last year which was a tibia/fibula fracture of the distal third.  She underwent intramedullary nailing by another physician in town.  She then went on to a nonunion and another physician performed exchange nailing.  She is since healed that fracture but is being plagued by pain at the proximal and distal interlocking screws.  She did everything else is doing well for her.  On exam she has tenderness over the anterior medial proximal interlocking screw in the anterior distal interlocking screw of her left leg.  X-rays of her left tibia and fibula show the fracture is healed completely.  Of note her left knee and left ankle move well in her right operative hip from a total hip arthroplasty moves well.  At this point I do feel that removing the proximal medial interlocking screw and the anterior to posterior distal screw would be worthwhile because I do feel that she is having significant bursitis and tendinitis at both of these sites with pain on clinical exam.  Both screw heads are painful to palpation.  She does wish to have these interlocking screws removed.  We will work on getting this set up at her earliest convenience.  The risks and benefits of this were explained to her in detail.  There would be no restrictions after this for her.  All question concerns were answered and addressed.  We will work on getting this scheduled and see her back in 2 weeks postoperative but no x-rays will be needed.

## 2017-12-20 ENCOUNTER — Telehealth (INDEPENDENT_AMBULATORY_CARE_PROVIDER_SITE_OTHER): Payer: Self-pay | Admitting: Orthopaedic Surgery

## 2017-12-20 DIAGNOSIS — T8484XA Pain due to internal orthopedic prosthetic devices, implants and grafts, initial encounter: Secondary | ICD-10-CM | POA: Diagnosis not present

## 2017-12-20 NOTE — Telephone Encounter (Signed)
Patient had surgery this morning and would like to know how long she should leave the compression bandage on. States it was not in her discharge notes. Please advise # [phone]

## 2017-12-20 NOTE — Telephone Encounter (Signed)
Please advise 

## 2017-12-25 ENCOUNTER — Encounter (INDEPENDENT_AMBULATORY_CARE_PROVIDER_SITE_OTHER): Payer: Self-pay | Admitting: Orthopaedic Surgery

## 2017-12-25 ENCOUNTER — Ambulatory Visit (INDEPENDENT_AMBULATORY_CARE_PROVIDER_SITE_OTHER): Payer: BLUE CROSS/BLUE SHIELD | Admitting: Physician Assistant

## 2017-12-25 DIAGNOSIS — Z9889 Other specified postprocedural states: Secondary | ICD-10-CM

## 2017-12-25 NOTE — Progress Notes (Signed)
Patient: Angela Harrison           Date of Birth: 04/26/1975           MRN: [id number] Visit Date: 12/25/2017 PCP: Farris Has, MD   Assessment & Plan:  Chief Complaint:  Chief Complaint  Patient presents with  . Left Leg - Pain   Visit Diagnoses:  1. Status post hardware removal     Plan: Patient is a pleasant 42 year old female who presents our clinic today with concerns about her left lower leg.  She has about 5 days status post hardware removal left lower leg by Dr. Magnus Ivan on 12/20/2017.  She has been doing fairly well but has noticed increased erythema and pain to the most distal incision.  Mild bloody drainage but nothing more.  No fevers or chills.  Examination of her left lower extremity reveals well-healing surgical incisions with nylon sutures in place.  She does have more tenderness to the distal incision but very little erythema.  No active drainage.  No signs of cellulitis or infection.  At this point, I am not concerned with infection.  She will follow-up with Dr. Magnus Ivan at her regularly scheduled appointment on 01/03/2018.  Call if concerns in the meantime  Follow-Up Instructions: Return for with dr.blackman on 10/10.   Orders:  No orders of the defined types were placed in this encounter.  No orders of the defined types were placed in this encounter.   Imaging: No new imaging  PMFS History: Patient Active Problem List   Diagnosis Date Noted  . Status post hardware removal 12/25/2017  . Closed fracture of left fibula and tibia 12/12/2017  . 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 Unilateral primary osteoarthritis, right hip 09/21/2017  . Status post total replacement of right hip 09/21/2017  . Vitamin D deficiency 12/16/2016  . Ehlers-Danlos syndrome type III   . Arthritis   . Migraine   . 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 Closed left tibial fracture  04/11/2016  . Fracture tibia/fibula, left, closed, initial encounter 04/11/2016  . Obese 08/25/2015  . S/P left THA, AA 08/24/2015  . PANIC DISORDER WITHOUT AGORAPHOBIA 09/24/2007  . MIGRAINE HEADACHE 09/24/2007  . 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 DYSPNEA 09/24/2007   Past Medical History:  Diagnosis Date  . Anemia    PMH  . Arthritis    oa  . 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 Ehlers-Danlos syndrome type III   . Gastric paresis   . Migraine    migraines  . Syncope    neurocardiogenic takes toprol and zoloft for  . Vitamin D deficiency 12/16/2016    Family History  Problem Relation Age of Onset  . Breast cancer Mother   . Migraines Mother   . Hypertension Father   . Migraines Father   . Migraines Brother   . Migraines Other     Past Surgical History:  Procedure Laterality Date  . CHOLECYSTECTOMY    . ESOPHAGOGASTRODUODENOSCOPY ENDOSCOPY     x 2 or 3  . FIBULA FRACTURE SURGERY Left 12/14/2016  . HARDWARE REMOVAL Left 12/14/2016   Procedure: HARDWARE REMOVAL LEFT TIBIA;  Surgeon: Myrene Galas, MD;  Location: [facility] OR;  Service: Orthopedics;  Laterality: Left;  . IM NAILING TIBIA Left 12/14/2016  . KNEE SURGERY Bilateral    2 on each knee  .  MAXILLARY ANTROSTOMY    . ORIF ANKLE FRACTURE Left 04/12/2016   Procedure: OPEN REDUCTION INTERNAL FIXATION (ORIF) ANKLE LEFT;  Surgeon: Yolonda Kida, MD;  Location: [facility] OR;  Service: Orthopedics;  Laterality: Left;  . ORIF FIBULA FRACTURE Left 12/14/2016   Procedure: NON-UNION REPAIR LEFT FIBULA FRACTURE;  Surgeon: Myrene Galas, MD;  Location: [facility] OR;  Service: Orthopedics;  Laterality: Left;  . SHOULDER SURGERY Left    x 4  . TIBIA HARDWARE REMOVAL Left 12/14/2016  . TIBIA IM NAIL INSERTION Left 04/12/2016   Procedure: INTRAMEDULLARY (IM) NAIL TIBIAL LEFT;  Surgeon: Yolonda Kida, MD;  Location: [facility] OR;  Service: Orthopedics;  Laterality: Left;  . TIBIA IM NAIL INSERTION  Left 12/14/2016   Procedure: INTRAMEDULLARY (IM) NAIL LEFT TIBIAL;  Surgeon: Myrene Galas, MD;  Location: [facility] OR;  Service: Orthopedics;  Laterality: Left;  . TONSILLECTOMY     adenoids also  . TOTAL HIP ARTHROPLASTY Left 08/24/2015   Procedure: LEFT TOTAL HIP ARTHROPLASTY ANTERIOR APPROACH;  Surgeon: Durene Romans, MD;  Location: [facility] ORS;  Service: Orthopedics;  Laterality: Left;  . TOTAL HIP ARTHROPLASTY Right 09/21/2017   Procedure: RIGHT TOTAL HIP ARTHROPLASTY ANTERIOR APPROACH;  Surgeon: Kathryne Hitch, MD;  Location: [facility] ORS;  Service: Orthopedics;  Laterality: Right;   Social History   Occupational History  . Not on file  Tobacco Use  . Smoking status: Never Smoker  . Smokeless tobacco: Never Used  Substance and Sexual Activity  . Alcohol use: Yes    Comment: Socially   . Drug use: No  . Sexual activity: Not on file

## 2018-01-03 ENCOUNTER — Encounter (INDEPENDENT_AMBULATORY_CARE_PROVIDER_SITE_OTHER): Payer: Self-pay | Admitting: Orthopaedic Surgery

## 2018-01-03 ENCOUNTER — Ambulatory Visit (INDEPENDENT_AMBULATORY_CARE_PROVIDER_SITE_OTHER): Payer: BLUE CROSS/BLUE SHIELD | Admitting: Orthopaedic Surgery

## 2018-01-03 DIAGNOSIS — Z96641 Presence of right artificial hip joint: Secondary | ICD-10-CM

## 2018-01-03 DIAGNOSIS — Z9889 Other specified postprocedural states: Secondary | ICD-10-CM

## 2018-01-03 NOTE — Progress Notes (Signed)
Angela Harrison is 2 weeks status post hardware removal of proximal and distal interlocking screw from a left tibial nail secondary to prominence of hardware.  The fracture she had in the distal third of the tibia have healed completely.  She is doing well since then.  She is also almost 4 months status post a right total hip arthroplasty.  She is having some pressure in her right hip but overall she is doing well.  I did remove sutures from her proximal and distal tibia on the left side and placed Steri-Strips.  There is no evidence of infection at all.  Her knee and ankle move well on the left side.  Her right total hip moves well.  At this point she will continue increase her activities as comfort allows.  I would like to see her back in 3 months because having a 29-month standpoint from her right total hip arthroplasty.  Would like a standing low AP pelvis and lateral right operative hip.

## 2018-01-09 DIAGNOSIS — M79641 Pain in right hand: Secondary | ICD-10-CM | POA: Diagnosis not present

## 2018-02-06 DIAGNOSIS — I951 Orthostatic hypotension: Secondary | ICD-10-CM | POA: Diagnosis not present

## 2018-02-06 DIAGNOSIS — Z6829 Body mass index (BMI) 29.0-29.9, adult: Secondary | ICD-10-CM | POA: Diagnosis not present

## 2018-02-06 DIAGNOSIS — R000 Tachycardia, unspecified: Secondary | ICD-10-CM | POA: Diagnosis not present

## 2018-02-11 DIAGNOSIS — G894 Chronic pain syndrome: Secondary | ICD-10-CM | POA: Diagnosis not present

## 2018-02-13 ENCOUNTER — Encounter (INDEPENDENT_AMBULATORY_CARE_PROVIDER_SITE_OTHER): Payer: Self-pay | Admitting: Orthopaedic Surgery

## 2018-02-13 ENCOUNTER — Ambulatory Visit (INDEPENDENT_AMBULATORY_CARE_PROVIDER_SITE_OTHER): Payer: Self-pay

## 2018-02-13 ENCOUNTER — Ambulatory Visit (INDEPENDENT_AMBULATORY_CARE_PROVIDER_SITE_OTHER): Payer: BLUE CROSS/BLUE SHIELD | Admitting: Orthopaedic Surgery

## 2018-02-13 DIAGNOSIS — M25551 Pain in right hip: Secondary | ICD-10-CM

## 2018-02-13 DIAGNOSIS — M7061 Trochanteric bursitis, right hip: Secondary | ICD-10-CM

## 2018-02-13 MED ORDER — LIDOCAINE HCL 1 % IJ SOLN
3.0000 mL | INTRAMUSCULAR | Status: AC | PRN
  Administered 2018-02-13: 3 mL

## 2018-02-13 MED ORDER — METHYLPREDNISOLONE ACETATE 40 MG/ML IJ SUSP
40.0000 mg | INTRAMUSCULAR | Status: AC | PRN
  Administered 2018-02-13: 40 mg via INTRA_ARTICULAR

## 2018-02-13 NOTE — Progress Notes (Signed)
Office Visit Note   Patient: Angela Harrison           Date of Birth: 1975-12-16           MRN: [id number] Visit Date: 02/13/2018              Requested by: Farris Has, MD [address] [place], [place] [address] PCP: Farris Has, MD   Assessment & Plan: Visit Diagnoses:  1. Pain in right hip     Plan: Recommend she do some light stretching her IT band but no deep stretches.  She will follow-up at her regularly scheduled appointment in January no radiographs needed at that time.  Questions were encouraged and answered  Follow-Up Instructions: Return in about 6 weeks (around 03/27/2018).   Orders:  Orders Placed This Encounter  Procedures  . Large Joint Inj  . XR HIP UNILAT W OR W/O PELVIS 1V RIGHT   No orders of the defined types were placed in this encounter.     Procedures: Large Joint Inj on 02/13/2018 8:50 AM Indications: pain Details: 22 G 1.5 in needle, lateral approach  Arthrogram: No  Medications: 3 mL lidocaine 1 %; 40 mg methylPREDNISolone acetate 40 MG/ML Outcome: tolerated well, no immediate complications Procedure, treatment alternatives, risks and benefits explained, specific risks discussed. Consent was given by the patient. Immediately prior to procedure a time out was called to verify the correct patient, procedure, equipment, support staff and site/side marked as required. Patient was prepped and draped in the usual sterile fashion.       Clinical Data: No additional findings.   Subjective: Chief Complaint  Patient presents with  . Left Leg - Follow-up    HPI [name] returns today due to increased pain in her right hip especially with external rotation.  She states she has had no sensation of the hip coming out.  She does have soreness on the lateral aspect of the hip.  Otherwise both hips are doing well.  She is status post bilateral total hip arthroplasties.    Review of Systems See HPI otherwise  negative  Objective: Vital Signs: There were no vitals taken for this visit.  Physical Exam  Constitutional: She is oriented to person, place, and time. She appears well-developed and well-nourished. No distress.  Pulmonary/Chest: Effort normal.  Neurological: She is alert and oriented to person, place, and time.  Skin: She is not diaphoretic.    Ortho Exam Bilateral hips good range of motion of both hips the right hip with some discomfort with extremes of internal and external rotation.  She has tenderness over the right trochanteric region and down the IT band. Specialty Comments:  No specialty comments available.  Imaging: Xr Hip Unilat W Or W/o Pelvis 1v Right  Result Date: 02/13/2018 AP pelvis and lateral view right hip: Bilateral hips well located.  Status post bilateral total hip arthroplasties.  Hip components both hips appear well seated.  No acute fractures.    PMFS History: Patient Active Problem List   Diagnosis Date Noted  . Status post hardware removal 12/25/2017  . Closed fracture of left fibula and tibia 12/12/2017  . 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 Unilateral primary osteoarthritis, right hip 09/21/2017  . Status post total replacement of right hip 09/21/2017  . Vitamin D deficiency 12/16/2016  . Ehlers-Danlos syndrome type III   . Arthritis   . Migraine   . 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 Closed left tibial fracture 04/11/2016  . Fracture tibia/fibula, left, closed, initial encounter 04/11/2016  . Obese 08/25/2015  . S/P left THA, AA 08/24/2015  . PANIC DISORDER WITHOUT AGORAPHOBIA 09/24/2007  . MIGRAINE HEADACHE 09/24/2007  . 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 DYSPNEA 09/24/2007   Past Medical History:  Diagnosis Date  . Anemia    PMH  . Arthritis    oa  . 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 Ehlers-Danlos  syndrome type III   . Gastric paresis   . Migraine    migraines  . Syncope    neurocardiogenic takes toprol and zoloft for  . Vitamin D deficiency 12/16/2016    Family History  Problem Relation Age of Onset  . Breast cancer Mother   . Migraines Mother   . Hypertension Father   . Migraines Father   . Migraines Brother   . Migraines Other     Past Surgical History:  Procedure Laterality Date  . CHOLECYSTECTOMY    . ESOPHAGOGASTRODUODENOSCOPY ENDOSCOPY     x 2 or 3  . FIBULA FRACTURE SURGERY Left 12/14/2016  . HARDWARE REMOVAL Left 12/14/2016   Procedure: HARDWARE REMOVAL LEFT TIBIA;  Surgeon: Myrene GalasHandy, Michael, MD;  [facility];  Service: Orthopedics;  Laterality: Left;  . IM NAILING TIBIA Left 12/14/2016  . KNEE SURGERY Bilateral    2 on each knee  . MAXILLARY ANTROSTOMY    . ORIF ANKLE FRACTURE Left 04/12/2016   Procedure: OPEN REDUCTION INTERNAL FIXATION (ORIF) ANKLE LEFT;  Surgeon: Yolonda KidaJason Patrick Rogers, MD;  [facility];  Service: Orthopedics;  Laterality: Left;  . ORIF FIBULA FRACTURE Left 12/14/2016   Procedure: NON-UNION REPAIR LEFT FIBULA FRACTURE;  Surgeon: Myrene GalasHandy, Michael, MD;  [facility];  Service: Orthopedics;  Laterality: Left;  . SHOULDER SURGERY Left    x 4  . TIBIA HARDWARE REMOVAL Left 12/14/2016  . TIBIA IM NAIL INSERTION Left 04/12/2016   Procedure: INTRAMEDULLARY (IM) NAIL TIBIAL LEFT;  Surgeon: Yolonda KidaJason Patrick Rogers, MD;  [facility];  Service: Orthopedics;  Laterality: Left;  . TIBIA IM NAIL INSERTION Left 12/14/2016   Procedure: INTRAMEDULLARY (IM) NAIL LEFT TIBIAL;  Surgeon: Myrene GalasHandy, Michael, MD;  Location: [facility] OR;  Service: Orthopedics;  Laterality: Left;  . TONSILLECTOMY     adenoids also  . TOTAL HIP ARTHROPLASTY Left 08/24/2015   Procedure: LEFT TOTAL HIP ARTHROPLASTY ANTERIOR APPROACH;  Surgeon: Durene RomansMatthew Olin, MD;  Location: [facility] ORS;  Service: Orthopedics;  Laterality: Left;  . TOTAL HIP ARTHROPLASTY Right 09/21/2017   Procedure: RIGHT TOTAL HIP  ARTHROPLASTY ANTERIOR APPROACH;  Surgeon: Kathryne HitchBlackman, Christopher Y, MD;  Location: [facility] ORS;  Service: Orthopedics;  Laterality: Right;   Social History   Occupational History  . Not on file  Tobacco Use  . Smoking status: Never Smoker  . Smokeless tobacco: Never Used  Substance and Sexual Activity  . Alcohol use: Yes    Comment: Socially   . Drug use: No  . Sexual activity: Not on file

## 2018-03-08 DIAGNOSIS — G43709 Chronic migraine without aura, not intractable, without status migrainosus: Secondary | ICD-10-CM | POA: Diagnosis not present

## 2018-04-01 DIAGNOSIS — H01024 Squamous blepharitis left upper eyelid: Secondary | ICD-10-CM | POA: Diagnosis not present

## 2018-04-01 DIAGNOSIS — H01025 Squamous blepharitis left lower eyelid: Secondary | ICD-10-CM | POA: Diagnosis not present

## 2018-04-01 DIAGNOSIS — H01021 Squamous blepharitis right upper eyelid: Secondary | ICD-10-CM | POA: Diagnosis not present

## 2018-04-01 DIAGNOSIS — H01022 Squamous blepharitis right lower eyelid: Secondary | ICD-10-CM | POA: Diagnosis not present

## 2018-04-04 ENCOUNTER — Ambulatory Visit (INDEPENDENT_AMBULATORY_CARE_PROVIDER_SITE_OTHER): Payer: BLUE CROSS/BLUE SHIELD | Admitting: Orthopaedic Surgery

## 2018-04-04 ENCOUNTER — Ambulatory Visit (INDEPENDENT_AMBULATORY_CARE_PROVIDER_SITE_OTHER): Payer: BLUE CROSS/BLUE SHIELD

## 2018-04-04 ENCOUNTER — Encounter (INDEPENDENT_AMBULATORY_CARE_PROVIDER_SITE_OTHER): Payer: Self-pay | Admitting: Orthopaedic Surgery

## 2018-04-04 DIAGNOSIS — Z96641 Presence of right artificial hip joint: Secondary | ICD-10-CM | POA: Diagnosis not present

## 2018-04-04 NOTE — Progress Notes (Signed)
  The patient is about 6 months status post a right total hip arthroplasty.  She has a left total hip done elsewhere.  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  She feels a sliding sensation recently in her right hip after going on a long vacation and slipping when she was on a vacation.  On exam I do not feel any instability of the hip and moves fluidly.  Her leg lengths are equal.  Standing AP pelvis shows no complicating features of her hips.  Knee replacements appear in good position.  I want her to slow down in terms of not overdoing it with her hips.  If she continues to have the symptoms we would need to see her back.  If not I want to see her in 6 months.  At that visit I would like a supine and not standing with supine AP pelvis and a lateral of her right operative hip.  All question concerns were answered and addressed.

## 2018-06-09 IMAGING — CT CT TIBIA FIBULA *L* W/O CM
1 series · 12 of 14 positions shown, 15 images · non-contrast
Comparison: Radiographs 04/12/2016.

CLINICAL DATA: Persistent left lower leg pain. History of fall with
fracture 04/15/2016 and ORIF 04/12/2016.

EXAM:
CT OF THE LOWER LEFT EXTREMITY WITHOUT CONTRAST
TECHNIQUE: Multidetector CT imaging of the left lower leg was performed
according to the standard protocol.

[Series 5: soft tissue lower extremity · axial · 0.50mm/px · z∈[+83,+565]mm · 12 of 285 slices shown, 15 images]
[im 22/285  soft-tissue]
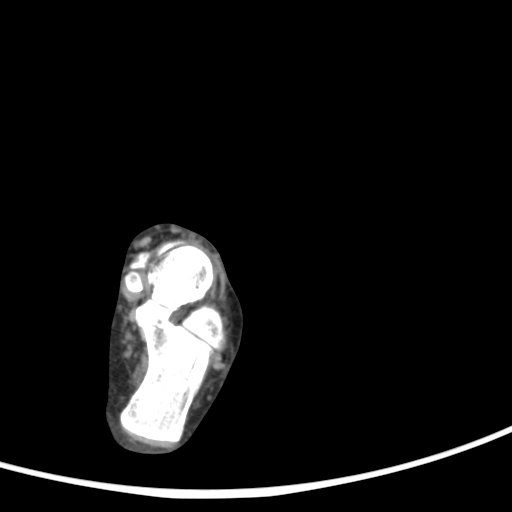
[im 22/285  bone]
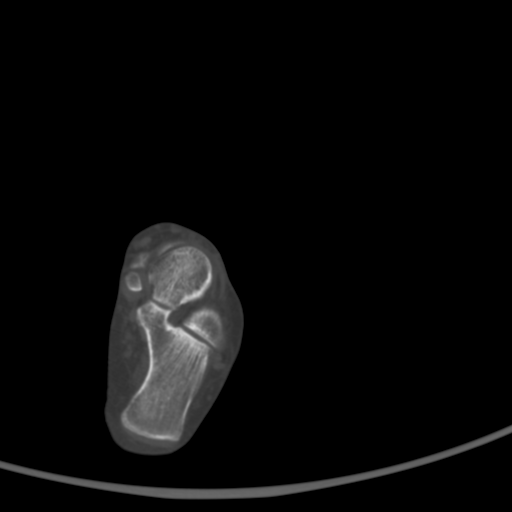
[im 44/285  bone]
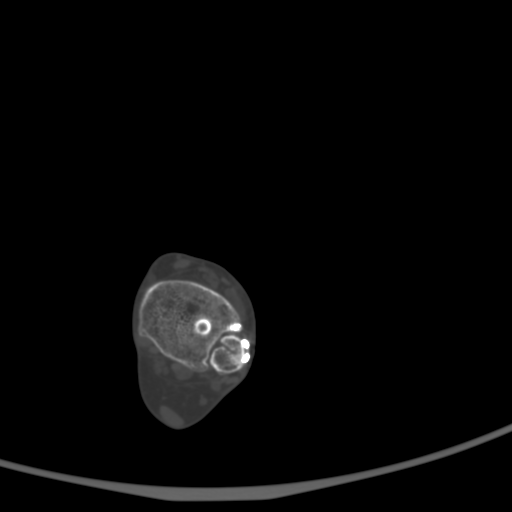
[im 66/285  bone]
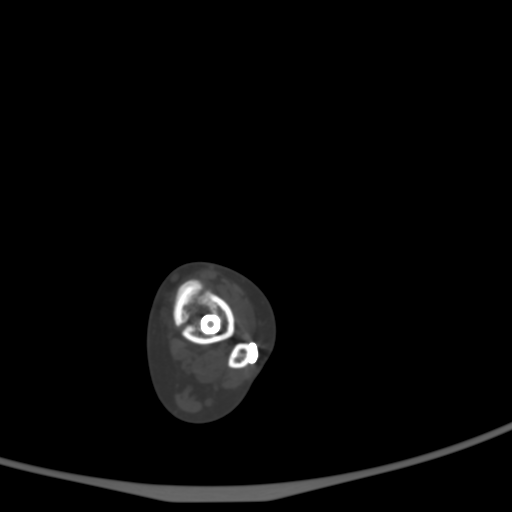
[im 88/285  bone]
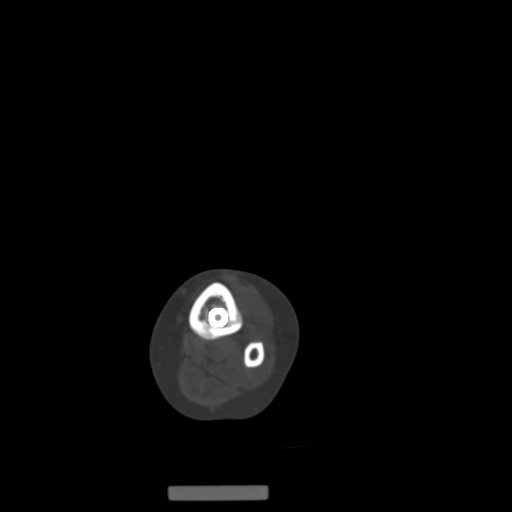
[im 110/285  soft-tissue]
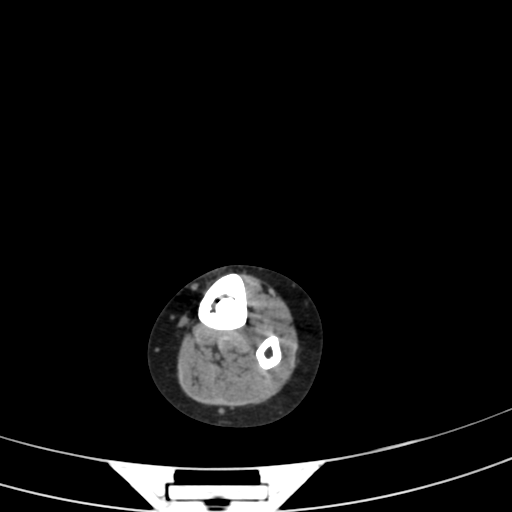
[im 110/285  bone]
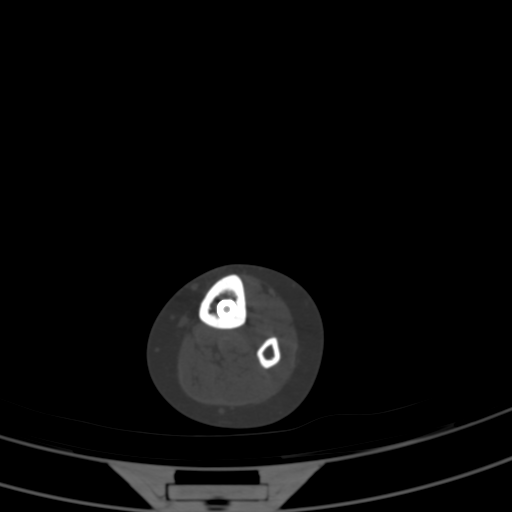
[im 132/285  bone]
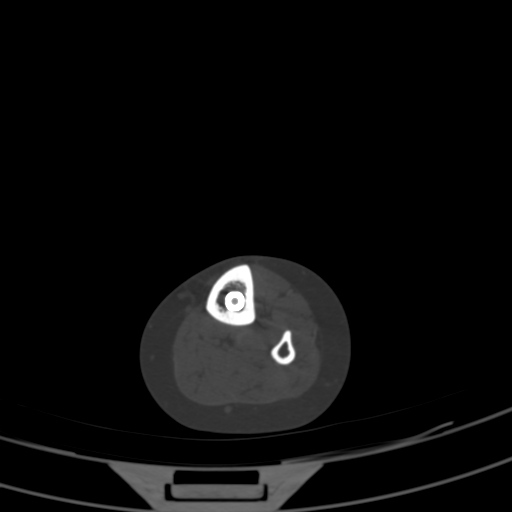
[im 153/285  bone]
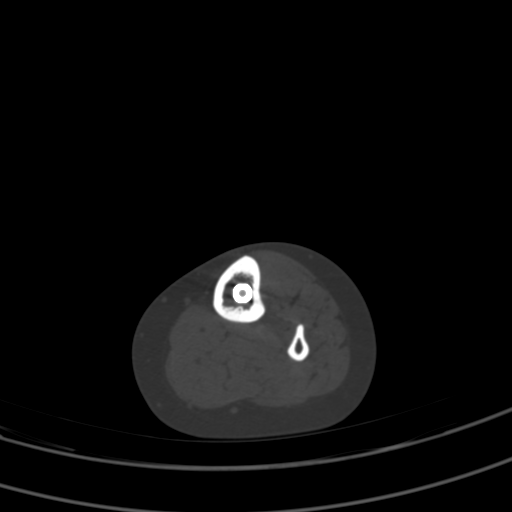
[im 175/285  bone]
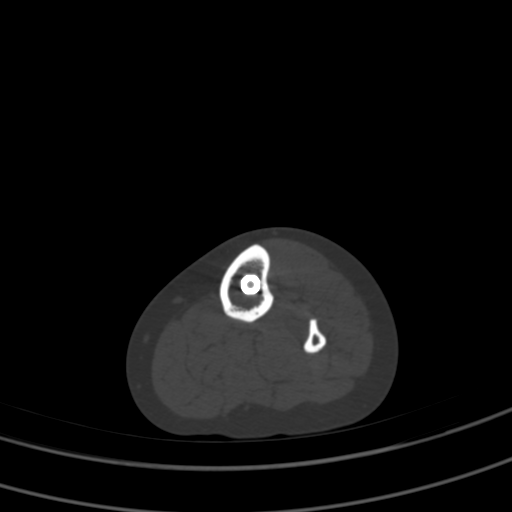
[im 197/285  soft-tissue]
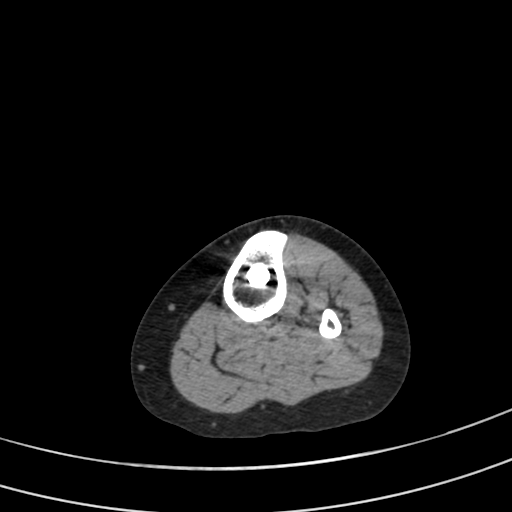
[im 197/285  bone]
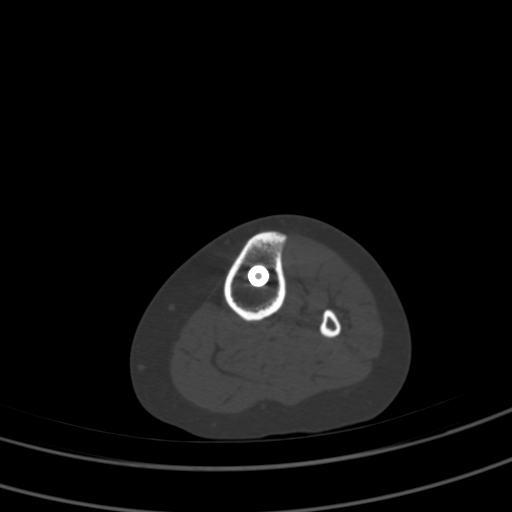
[im 219/285  bone]
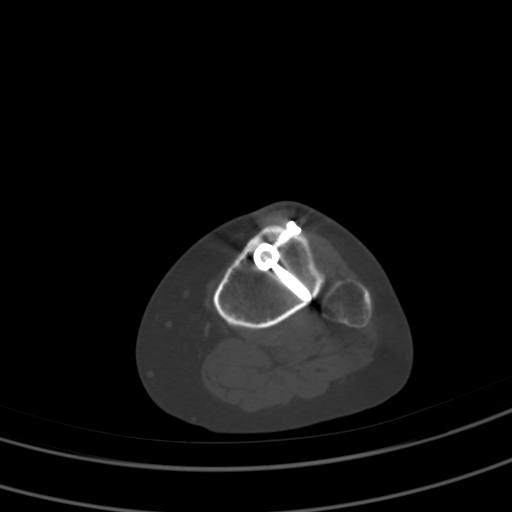
[im 241/285  bone]
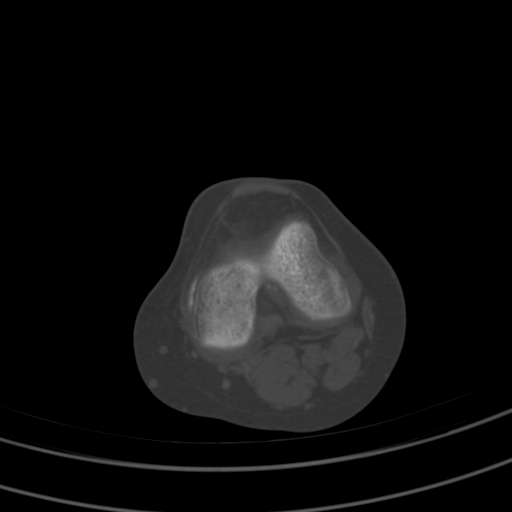
[im 263/285  bone]
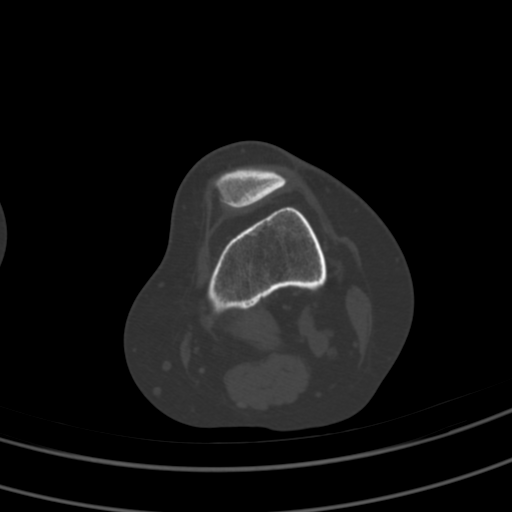

[12 of 14 positions shown; findings below may reference images not displayed]

FINDINGS: Bones/Joint/Cartilage

Tibial intramedullary nail is secured by 2 proximal and 2 distal
interlocking screws. The hardware is intact without loosening. There
is a distal fibular plate and screws which are also intact. The
oblique fracture of the distal tibial diaphysis remains
nondisplaced. This fracture demonstrates partial healing with areas
of partial osseous bridging anteriorly and medially. There is a
persistent non healed 4-5 mm osseous defect laterally. Oblique
fracture of the distal fibula also demonstrates partial osseous
bridging laterally and is nondisplaced. The tibial plafond is
intact. The talus is located. There is a small osteochondral lesion
of the talar dome antral laterally.

No significant ankle or knee joint effusion. There is prominent
meniscal chondrocalcinosis at the left knee. There is also patellar
chondrocalcinosis.

Ligaments

Suboptimally assessed by CT. The cruciate ligaments and ankle
ligaments appear grossly intact.

Muscles and Tendons

Unremarkable. The ankle tendons appear intact. Type 1 accessory
navicular noted.

Soft tissues

Unremarkable.
IMPRESSION: 1. Intact hardware without loosening status post tibial
intramedullary nail and distal fibular lateral plate and screw
fixation.
2. The underlying fractures of the distal tibia and fibula are
nondisplaced with partial osseous bridging. These findings may
reflect delayed healing, but no definite signs of nonunion are seen.
3. The soft tissues appear unremarkable aside from meniscal and
patellar chondrocalcinosis.

## 2018-06-11 DIAGNOSIS — Z79891 Long term (current) use of opiate analgesic: Secondary | ICD-10-CM | POA: Diagnosis not present

## 2018-06-11 DIAGNOSIS — Z79899 Other long term (current) drug therapy: Secondary | ICD-10-CM | POA: Diagnosis not present

## 2018-06-11 DIAGNOSIS — M5136 Other intervertebral disc degeneration, lumbar region: Secondary | ICD-10-CM | POA: Diagnosis not present

## 2018-06-13 DIAGNOSIS — J019 Acute sinusitis, unspecified: Secondary | ICD-10-CM | POA: Diagnosis not present

## 2018-06-13 DIAGNOSIS — R05 Cough: Secondary | ICD-10-CM | POA: Diagnosis not present

## 2018-07-26 DIAGNOSIS — M654 Radial styloid tenosynovitis [de Quervain]: Secondary | ICD-10-CM | POA: Diagnosis not present

## 2018-07-26 DIAGNOSIS — S638X1A Sprain of other part of right wrist and hand, initial encounter: Secondary | ICD-10-CM | POA: Diagnosis not present

## 2018-07-26 DIAGNOSIS — M79641 Pain in right hand: Secondary | ICD-10-CM | POA: Diagnosis not present

## 2018-07-26 DIAGNOSIS — G5601 Carpal tunnel syndrome, right upper limb: Secondary | ICD-10-CM | POA: Diagnosis not present

## 2018-09-04 DIAGNOSIS — G43709 Chronic migraine without aura, not intractable, without status migrainosus: Secondary | ICD-10-CM | POA: Diagnosis not present

## 2018-09-06 DIAGNOSIS — G5601 Carpal tunnel syndrome, right upper limb: Secondary | ICD-10-CM | POA: Diagnosis not present

## 2018-09-06 DIAGNOSIS — S638X1A Sprain of other part of right wrist and hand, initial encounter: Secondary | ICD-10-CM | POA: Diagnosis not present

## 2018-09-06 DIAGNOSIS — Q796 Ehlers-Danlos syndrome, unspecified: Secondary | ICD-10-CM | POA: Diagnosis not present

## 2018-09-06 DIAGNOSIS — M654 Radial styloid tenosynovitis [de Quervain]: Secondary | ICD-10-CM | POA: Diagnosis not present

## 2018-10-03 ENCOUNTER — Ambulatory Visit: Payer: Self-pay | Admitting: Orthopaedic Surgery

## 2018-10-07 ENCOUNTER — Encounter: Payer: Self-pay | Admitting: Orthopaedic Surgery

## 2018-10-07 ENCOUNTER — Ambulatory Visit (INDEPENDENT_AMBULATORY_CARE_PROVIDER_SITE_OTHER): Payer: BC Managed Care – PPO | Admitting: Orthopaedic Surgery

## 2018-10-07 ENCOUNTER — Ambulatory Visit: Payer: Self-pay

## 2018-10-07 ENCOUNTER — Other Ambulatory Visit: Payer: Self-pay

## 2018-10-07 DIAGNOSIS — Z96641 Presence of right artificial hip joint: Secondary | ICD-10-CM

## 2018-10-07 NOTE — Progress Notes (Signed)
The patient is 1 year status post a right total hip arthroplasty.  She has remote history of a left total hip arthroplasty.  She does have Ehlers-Danlos syndrome.  She has been developing a little bit of pain in the groin but otherwise has been doing well.  She walks without an assistive device and is otherwise doing well.  Both hips have excellent range of motion and are very lax ligamentously secondary to her connective tissue disorder.  Her leg lengths appear equal.  Both hips move smoothly.  A standing low AP pelvis and a lateral of our more recent right operative hip shows a well-seated implant with no complicating features or evidence of loosening.  At this point she will follow-up as needed.  She is doing well from my standpoint.  Hopefully the aches and pains that she is experiencing will diminish with time.  All question concerns were answered and addressed.

## 2018-10-08 DIAGNOSIS — Z79891 Long term (current) use of opiate analgesic: Secondary | ICD-10-CM | POA: Diagnosis not present

## 2018-10-08 DIAGNOSIS — G894 Chronic pain syndrome: Secondary | ICD-10-CM | POA: Diagnosis not present

## 2018-11-12 ENCOUNTER — Telehealth: Payer: Self-pay | Admitting: Orthopaedic Surgery

## 2018-11-12 NOTE — Telephone Encounter (Signed)
Called and [facility] for patient letting her know the note is at the front desk

## 2018-11-12 NOTE — Telephone Encounter (Signed)
Patient called advised the office manager Juanda Crumble called her from the dentist office of [facility] needing a note stating patient does not need an antibiotic prior to her appointment. Patient said she has an appointment today at 2:00pm. Patient said she can pick up note.The number to the dentist office is [phone]   The number to contact patient is [phone]

## 2018-11-25 DIAGNOSIS — Z1231 Encounter for screening mammogram for malignant neoplasm of breast: Secondary | ICD-10-CM | POA: Diagnosis not present

## 2018-11-25 DIAGNOSIS — Z803 Family history of malignant neoplasm of breast: Secondary | ICD-10-CM | POA: Diagnosis not present

## 2018-12-31 ENCOUNTER — Other Ambulatory Visit: Payer: Self-pay

## 2018-12-31 DIAGNOSIS — B349 Viral infection, unspecified: Secondary | ICD-10-CM | POA: Diagnosis not present

## 2018-12-31 DIAGNOSIS — Z20822 Contact with and (suspected) exposure to covid-19: Secondary | ICD-10-CM

## 2019-01-01 LAB — NOVEL CORONAVIRUS, NAA: SARS-CoV-2, NAA: NOT DETECTED

## 2019-01-27 ENCOUNTER — Other Ambulatory Visit: Payer: Self-pay

## 2019-01-27 DIAGNOSIS — Z20822 Contact with and (suspected) exposure to covid-19: Secondary | ICD-10-CM

## 2019-01-29 LAB — NOVEL CORONAVIRUS, NAA: SARS-CoV-2, NAA: NOT DETECTED

## 2019-02-07 DIAGNOSIS — M5136 Other intervertebral disc degeneration, lumbar region: Secondary | ICD-10-CM | POA: Diagnosis not present

## 2019-02-25 DIAGNOSIS — T8484XA Pain due to internal orthopedic prosthetic devices, implants and grafts, initial encounter: Secondary | ICD-10-CM | POA: Diagnosis not present

## 2019-02-25 DIAGNOSIS — M79605 Pain in left leg: Secondary | ICD-10-CM | POA: Diagnosis not present

## 2019-03-11 DIAGNOSIS — L309 Dermatitis, unspecified: Secondary | ICD-10-CM | POA: Diagnosis not present

## 2019-03-12 ENCOUNTER — Other Ambulatory Visit: Payer: Self-pay

## 2019-03-12 ENCOUNTER — Ambulatory Visit: Payer: BC Managed Care – PPO | Attending: Internal Medicine

## 2019-03-12 DIAGNOSIS — Z20828 Contact with and (suspected) exposure to other viral communicable diseases: Secondary | ICD-10-CM | POA: Diagnosis not present

## 2019-03-12 DIAGNOSIS — Z20822 Contact with and (suspected) exposure to covid-19: Secondary | ICD-10-CM

## 2019-03-14 LAB — NOVEL CORONAVIRUS, NAA: SARS-CoV-2, NAA: NOT DETECTED

## 2019-04-11 ENCOUNTER — Ambulatory Visit: Payer: BC Managed Care – PPO | Attending: Internal Medicine

## 2019-04-11 DIAGNOSIS — Z20822 Contact with and (suspected) exposure to covid-19: Secondary | ICD-10-CM | POA: Diagnosis not present

## 2019-04-12 LAB — NOVEL CORONAVIRUS, NAA: SARS-CoV-2, NAA: NOT DETECTED

## 2019-04-17 IMAGING — DX DG PORTABLE PELVIS
1 series · 1 of 1 positions shown · non-contrast
Comparison: 09/21/2017 and 08/22/2017

CLINICAL DATA: Status post right hip replacement.

EXAM:
PORTABLE PELVIS 1-2 VIEWS

[pelvis ap]
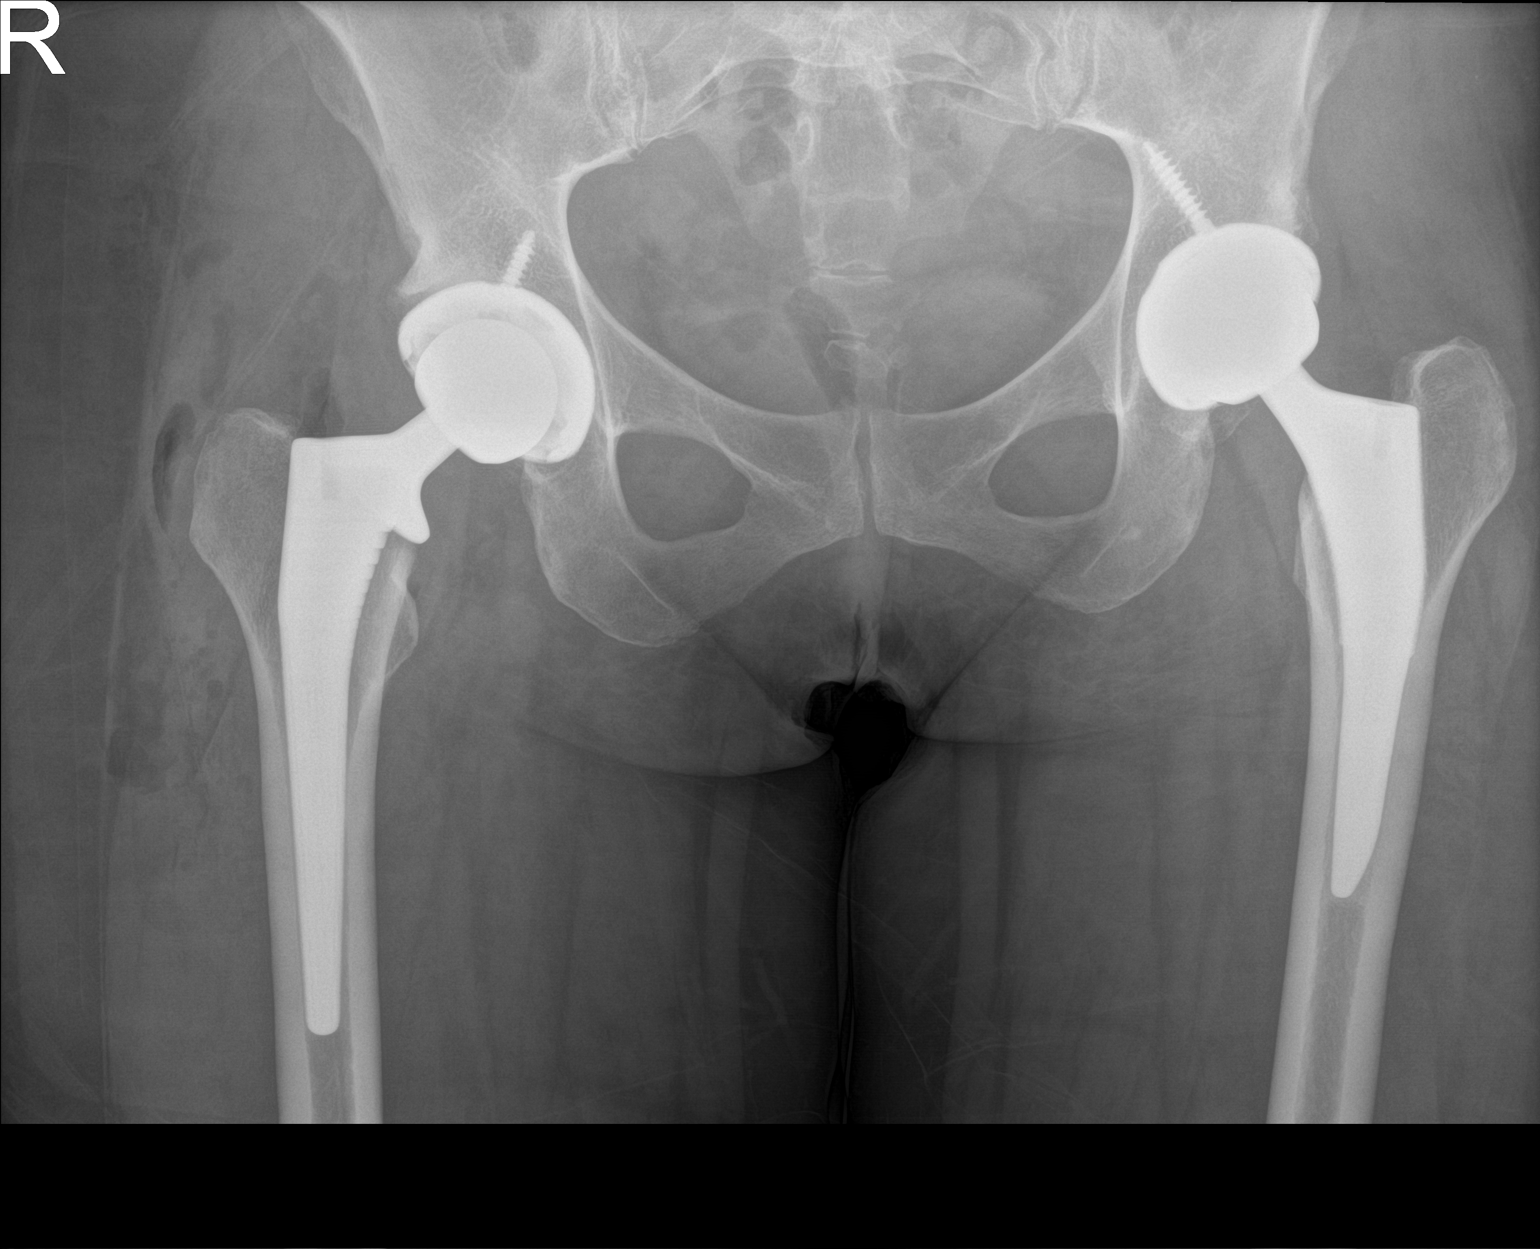

[1 of 1 positions shown; findings below may reference images not displayed]

FINDINGS: Patient now has a total right hip arthroplasty. The right hip
arthroplasty appears located on this single view. The entire right
femoral stem is visualized. Stable appearance of the left total hip
arthroplasty. Expected soft tissue lucency in the right upper thigh
and hip region.
IMPRESSION: Expected postoperative changes from a right total hip arthroplasty.

## 2019-05-27 DIAGNOSIS — Z7189 Other specified counseling: Secondary | ICD-10-CM | POA: Diagnosis not present

## 2019-05-27 DIAGNOSIS — Z20828 Contact with and (suspected) exposure to other viral communicable diseases: Secondary | ICD-10-CM | POA: Diagnosis not present

## 2019-05-29 ENCOUNTER — Ambulatory Visit: Payer: BC Managed Care – PPO | Attending: Internal Medicine

## 2019-05-29 DIAGNOSIS — Z23 Encounter for immunization: Secondary | ICD-10-CM | POA: Insufficient documentation

## 2019-05-29 NOTE — Progress Notes (Signed)
   Covid-19 Vaccination Clinic  Name:  Angela Harrison    MRN: [id number] DOB: June 20, 1975  05/29/2019  [name] was observed post Covid-19 immunization for 15 minutes without incident. She was provided with Vaccine Information Sheet and instruction to access the V-Safe system.   [name] was instructed to call 911 with any severe reactions post vaccine: Marland Kitchen Difficulty breathing  . Swelling of face and throat  . A fast heartbeat  . A bad rash all over body  . Dizziness and weakness

## 2019-06-02 DIAGNOSIS — G43709 Chronic migraine without aura, not intractable, without status migrainosus: Secondary | ICD-10-CM | POA: Diagnosis not present

## 2019-06-02 DIAGNOSIS — G43009 Migraine without aura, not intractable, without status migrainosus: Secondary | ICD-10-CM | POA: Diagnosis not present

## 2019-06-06 DIAGNOSIS — Z6829 Body mass index (BMI) 29.0-29.9, adult: Secondary | ICD-10-CM | POA: Diagnosis not present

## 2019-06-06 DIAGNOSIS — R55 Syncope and collapse: Secondary | ICD-10-CM | POA: Diagnosis not present

## 2019-06-06 DIAGNOSIS — Z79899 Other long term (current) drug therapy: Secondary | ICD-10-CM | POA: Diagnosis not present

## 2019-06-06 DIAGNOSIS — G894 Chronic pain syndrome: Secondary | ICD-10-CM | POA: Diagnosis not present

## 2019-06-19 DIAGNOSIS — M25552 Pain in left hip: Secondary | ICD-10-CM | POA: Diagnosis not present

## 2019-06-19 DIAGNOSIS — M25551 Pain in right hip: Secondary | ICD-10-CM | POA: Diagnosis not present

## 2019-06-25 ENCOUNTER — Ambulatory Visit: Payer: Self-pay | Attending: Internal Medicine

## 2019-06-25 DIAGNOSIS — M25551 Pain in right hip: Secondary | ICD-10-CM | POA: Diagnosis not present

## 2019-06-25 DIAGNOSIS — M25552 Pain in left hip: Secondary | ICD-10-CM | POA: Diagnosis not present

## 2019-06-25 DIAGNOSIS — Z23 Encounter for immunization: Secondary | ICD-10-CM

## 2019-07-01 DIAGNOSIS — M25559 Pain in unspecified hip: Secondary | ICD-10-CM | POA: Diagnosis not present

## 2019-07-03 DIAGNOSIS — M25559 Pain in unspecified hip: Secondary | ICD-10-CM | POA: Diagnosis not present

## 2019-07-08 DIAGNOSIS — M25559 Pain in unspecified hip: Secondary | ICD-10-CM | POA: Diagnosis not present

## 2019-07-10 DIAGNOSIS — M25559 Pain in unspecified hip: Secondary | ICD-10-CM | POA: Diagnosis not present

## 2019-07-15 DIAGNOSIS — M25559 Pain in unspecified hip: Secondary | ICD-10-CM | POA: Diagnosis not present

## 2019-07-17 DIAGNOSIS — M25552 Pain in left hip: Secondary | ICD-10-CM | POA: Diagnosis not present

## 2019-07-17 DIAGNOSIS — M25551 Pain in right hip: Secondary | ICD-10-CM | POA: Diagnosis not present

## 2019-07-22 DIAGNOSIS — M25551 Pain in right hip: Secondary | ICD-10-CM | POA: Diagnosis not present

## 2019-07-22 DIAGNOSIS — M25552 Pain in left hip: Secondary | ICD-10-CM | POA: Diagnosis not present

## 2019-07-29 DIAGNOSIS — M25559 Pain in unspecified hip: Secondary | ICD-10-CM | POA: Diagnosis not present

## 2019-08-05 DIAGNOSIS — M25551 Pain in right hip: Secondary | ICD-10-CM | POA: Diagnosis not present

## 2019-08-05 DIAGNOSIS — M25552 Pain in left hip: Secondary | ICD-10-CM | POA: Diagnosis not present

## 2019-08-07 DIAGNOSIS — M25551 Pain in right hip: Secondary | ICD-10-CM | POA: Diagnosis not present

## 2019-08-07 DIAGNOSIS — M25552 Pain in left hip: Secondary | ICD-10-CM | POA: Diagnosis not present

## 2019-08-13 DIAGNOSIS — M25552 Pain in left hip: Secondary | ICD-10-CM | POA: Diagnosis not present

## 2019-08-13 DIAGNOSIS — M25551 Pain in right hip: Secondary | ICD-10-CM | POA: Diagnosis not present

## 2019-08-20 DIAGNOSIS — M25559 Pain in unspecified hip: Secondary | ICD-10-CM | POA: Diagnosis not present

## 2019-08-27 DIAGNOSIS — M25552 Pain in left hip: Secondary | ICD-10-CM | POA: Diagnosis not present

## 2019-08-27 DIAGNOSIS — M25551 Pain in right hip: Secondary | ICD-10-CM | POA: Diagnosis not present

## 2019-08-29 DIAGNOSIS — L255 Unspecified contact dermatitis due to plants, except food: Secondary | ICD-10-CM | POA: Diagnosis not present

## 2019-09-03 DIAGNOSIS — M25551 Pain in right hip: Secondary | ICD-10-CM | POA: Diagnosis not present

## 2019-09-03 DIAGNOSIS — M25552 Pain in left hip: Secondary | ICD-10-CM | POA: Diagnosis not present

## 2019-09-05 DIAGNOSIS — G894 Chronic pain syndrome: Secondary | ICD-10-CM | POA: Diagnosis not present

## 2019-09-30 ENCOUNTER — Emergency Department (HOSPITAL_BASED_OUTPATIENT_CLINIC_OR_DEPARTMENT_OTHER)
Admission: EM | Admit: 2019-09-30 | Discharge: 2019-10-01 | Disposition: A | Discharge status: Home / Self Care | Payer: BC Managed Care – PPO | Source: Home / Self Care | Attending: Emergency Medicine | Admitting: Emergency Medicine

## 2019-09-30 ENCOUNTER — Encounter (HOSPITAL_BASED_OUTPATIENT_CLINIC_OR_DEPARTMENT_OTHER): Payer: Self-pay

## 2019-09-30 ENCOUNTER — Other Ambulatory Visit: Payer: Self-pay

## 2019-09-30 DIAGNOSIS — K3184 Gastroparesis: Secondary | ICD-10-CM

## 2019-09-30 DIAGNOSIS — R000 Tachycardia, unspecified: Secondary | ICD-10-CM | POA: Diagnosis not present

## 2019-09-30 LAB — COMPREHENSIVE METABOLIC PANEL
ALT: 19 U/L (ref 0–44)
AST: 21 U/L (ref 15–41)
Albumin: 4.5 g/dL (ref 3.5–5.0)
Alkaline Phosphatase: 84 U/L (ref 38–126)
Anion gap: 14 (ref 5–15)
BUN: 10 mg/dL (ref 6–20)
CO2: 24 mmol/L (ref 22–32)
Calcium: 9.4 mg/dL (ref 8.9–10.3)
Chloride: 100 mmol/L (ref 98–111)
Creatinine, Ser: 1.19 mg/dL — ABNORMAL HIGH (ref 0.44–1.00)
GFR calc Af Amer: >60 mL/min (ref >60)
GFR calc non Af Amer: 55 mL/min — ABNORMAL LOW (ref >60)
Glucose, Bld: 173 mg/dL — ABNORMAL HIGH (ref 70–99)
Potassium: 3.6 mmol/L (ref 3.5–5.1)
Sodium: 138 mmol/L (ref 135–145)
Total Bilirubin: 0.6 mg/dL (ref 0.3–1.2)
Total Protein: 8.2 g/dL — ABNORMAL HIGH (ref 6.5–8.1)

## 2019-09-30 LAB — CBC
HCT: 39.5 % (ref 36.0–46.0)
Hemoglobin: 12.6 g/dL (ref 12.0–15.0)
MCH: 24.8 pg — ABNORMAL LOW (ref 26.0–34.0)
MCHC: 31.9 g/dL (ref 30.0–36.0)
MCV: 77.6 fL — ABNORMAL LOW (ref 80.0–100.0)
Platelets: 559 10*3/uL — ABNORMAL HIGH (ref 150–400)
RBC: 5.09 MIL/uL (ref 3.87–5.11)
RDW: 15.2 % (ref 11.5–15.5)
WBC: 9.7 10*3/uL (ref 4.0–10.5)
nRBC: 0 % (ref 0.0–0.2)

## 2019-09-30 LAB — LIPASE, BLOOD: Lipase: 24 U/L (ref 11–51)

## 2019-09-30 MED ORDER — SODIUM CHLORIDE 0.9% FLUSH
3.0000 mL | Freq: Once | INTRAVENOUS | Status: DC
  Filled 2019-09-30: qty 3

## 2019-09-30 NOTE — ED Notes (Signed)
Called into triage 1 by EMT to check on pt-pt post lab draw-hyperventilating with hands cramping-encouraged to take slow deep breaths-VSS-taken back to ED WR via w/c

## 2019-09-30 NOTE — ED Triage Notes (Signed)
Pt c/o abd pain n/v started 7/1-NAD-steady gait

## 2019-10-01 LAB — URINALYSIS, ROUTINE W REFLEX MICROSCOPIC
Bilirubin Urine: NEGATIVE
Glucose, UA: 100 mg/dL — AB
Hgb urine dipstick: NEGATIVE
Ketones, ur: 40 mg/dL — AB
Leukocytes,Ua: NEGATIVE
Nitrite: NEGATIVE
Protein, ur: NEGATIVE mg/dL
Specific Gravity, Urine: 1.02 (ref 1.005–1.030)
pH: 6.5 (ref 5.0–8.0)

## 2019-10-01 LAB — MAGNESIUM: Magnesium: 2 mg/dL (ref 1.7–2.4)

## 2019-10-01 LAB — PREGNANCY, URINE: Preg Test, Ur: NEGATIVE

## 2019-10-01 MED ORDER — SODIUM CHLORIDE 0.9 % IV BOLUS (SEPSIS)
1000.0000 mL | Freq: Once | INTRAVENOUS | Status: AC
  Administered 2019-10-01: 1000 mL via INTRAVENOUS

## 2019-10-01 MED ORDER — MORPHINE SULFATE (PF) 4 MG/ML IV SOLN
4.0000 mg | Freq: Once | INTRAVENOUS | Status: AC
  Administered 2019-10-01: 4 mg via INTRAVENOUS
  Filled 2019-10-01: qty 1

## 2019-10-01 MED ORDER — KETOROLAC TROMETHAMINE 30 MG/ML IJ SOLN
30.0000 mg | Freq: Once | INTRAMUSCULAR | Status: AC
  Administered 2019-10-01: 30 mg via INTRAVENOUS
  Filled 2019-10-01: qty 1

## 2019-10-01 MED ORDER — METOCLOPRAMIDE HCL 5 MG/ML IJ SOLN
10.0000 mg | Freq: Once | INTRAMUSCULAR | Status: AC
  Administered 2019-10-01: 10 mg via INTRAVENOUS
  Filled 2019-10-01: qty 2

## 2019-10-01 MED ORDER — ONDANSETRON 4 MG PO TBDP: 4.0000 mg | ORAL_TABLET | Freq: Four times a day (QID) | ORAL | 0 refills | Status: AC | PRN

## 2019-10-01 MED ORDER — DICYCLOMINE HCL 10 MG/ML IM SOLN
20.0000 mg | Freq: Once | INTRAMUSCULAR | Status: AC
  Administered 2019-10-01: 20 mg via INTRAMUSCULAR
  Filled 2019-10-01: qty 2

## 2019-10-01 MED ORDER — METOCLOPRAMIDE HCL 10 MG PO TABS
10.0000 mg | ORAL_TABLET | Freq: Three times a day (TID) | ORAL | 0 refills | Status: DC | PRN
Start: 2019-10-01 — End: 2020-05-25

## 2019-10-01 MED ORDER — DIPHENHYDRAMINE HCL 50 MG/ML IJ SOLN
25.0000 mg | Freq: Once | INTRAMUSCULAR | Status: AC
  Administered 2019-10-01: 25 mg via INTRAVENOUS
  Filled 2019-10-01: qty 1

## 2019-10-01 MED ORDER — LORAZEPAM 2 MG/ML IJ SOLN
1.0000 mg | Freq: Once | INTRAMUSCULAR | Status: AC
  Administered 2019-10-01: 1 mg via INTRAVENOUS
  Filled 2019-10-01: qty 1

## 2019-10-01 MED ORDER — PROMETHAZINE HCL 25 MG/ML IJ SOLN
25.0000 mg | Freq: Once | INTRAMUSCULAR | Status: AC
  Administered 2019-10-01: 25 mg via INTRAVENOUS
  Filled 2019-10-01: qty 1

## 2019-10-01 NOTE — ED Provider Notes (Signed)
TIME SEEN: 12:09 AM  CHIEF COMPLAINT: vomiting  HPI: Patient is a 44 year old female with history of Ehlers Danlos followed at [facility], gastroparesis who presents to the emergency department with nausea and vomiting since July 1.  She states she has some abdominal cramping just from vomiting so much.  No fevers, diarrhea, chest pain, shortness of breath.  No marijuana use.  Gastroenterologist is at [facility].  Tried Zofran x2, oral Phenergan and Phenergan suppository prior to arrival without relief.  No dysuria, hematuria, vaginal bleeding or discharge.  States that this is typical for her to vomit regularly but she is unable to get her symptoms under control.  ROS: See HPI Constitutional: no fever  Eyes: no drainage  ENT: no runny nose   Cardiovascular:  no chest pain  Resp: no SOB  GI: no vomiting GU: no dysuria Integumentary: no rash  Allergy: no hives  Musculoskeletal: no leg swelling  Neurological: no slurred speech ROS otherwise negative  PAST MEDICAL HISTORY/PAST SURGICAL HISTORY:  Past Medical History:  Diagnosis Date  . Anemia    PMH  . Arthritis    oa  . 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 Ehlers-Danlos syndrome type III   . Gastric paresis   . Migraine    migraines  . Syncope    neurocardiogenic takes toprol and zoloft for  . Vitamin D deficiency 12/16/2016    MEDICATIONS:  Prior to Admission medications   Medication Sig Start Date End Date Taking? Authorizing Provider  acetaminophen (TYLENOL) 500 MG tablet Take 500-1,000 mg by mouth every 8 (eight) hours as needed for moderate pain.    [provider]  aspirin 81 MG chewable tablet Chew 1 tablet (81 mg total) by mouth 2 (two) times daily. Patient not taking: Reported on 01/03/2018 09/22/17   Kathryne Hitch, MD  docusate sodium (COLACE) 100 MG capsule Take 1 capsule (100 mg total) by mouth 2 (two) times daily. Patient not taking: Reported on 09/11/2017 12/15/16    Montez Morita, PA-C  enoxaparin (LOVENOX) 40 MG/0.4ML injection Inject 0.4 mLs (40 mg total) into the skin daily. Patient not taking: Reported on 09/11/2017 12/16/16   Montez Morita, PA-C  HYDROcodone-acetaminophen [facility]) 5-325 MG tablet Take 1-2 tablets by mouth every 6 (six) hours as needed for moderate pain. 11/01/17   Kirtland Bouchard, PA-C  hydrocortisone cream 1 % Apply 1 application topically daily as needed for itching.    [provider]  meclizine (ANTIVERT) 25 MG tablet Take 25 mg by mouth 3 (three) times daily as needed for dizziness.    [provider]  methocarbamol (ROBAXIN) 750 MG tablet TAKE 1 TABLET (750 MG TOTAL) BY MOUTH EVERY 6 (SIX) HOURS AS NEEDED FOR MUSCLE SPASMS. 10/26/17   Kirtland Bouchard, PA-C  metoprolol succinate (TOPROL-XL) 50 MG 24 hr tablet TAKE 1 TABLET (50 MG TOTAL) BY MOUTH AT BEDTIME 03/13/16   [provider]  ondansetron (ZOFRAN) 8 MG tablet Take 8 mg by mouth every 8 (eight) hours as needed for nausea or vomiting.     [provider]  polyethylene glycol (MIRALAX / GLYCOLAX) packet Take 17 g by mouth daily. Patient not taking: Reported on 09/11/2017 12/15/16   Montez Morita, PA-C  prochlorperazine (COMPAZINE) 10 MG tablet Take 10 mg by mouth every 6 (six) hours as needed for nausea or vomiting.    [provider]  promethazine (PHENERGAN) 25 MG suppository Place 25 mg rectally every  6 (six) hours as needed for nausea or vomiting.     [provider]  sertraline (ZOLOFT) 50 MG tablet TAKE 1 TABLET (50 MG TOTAL) BY MOUTH AT BEDTIME 11/27/16   [provider]  SUMAtriptan (IMITREX) 6 MG/0.5ML SOLN injection Inject 6 mg into the skin every 2 (two) hours as needed for migraine or headache. May repeat in 2 hours if headache persists or recurs.    [provider]    ALLERGIES:  Allergies  Allergen Reactions  . Cephalosporins Hives, Swelling, Rash and Other (See Comments)    Can tolerate Penicillin  .  Cefazolin Hives, Swelling, Rash and Other (See Comments)    Can tolerate Penicillin   . Cephalexin Hives, Swelling, Rash and Other (See Comments)    Can tolerate Penicillin     SOCIAL HISTORY:  Social History   Tobacco Use  . Smoking status: Never Smoker  . Smokeless tobacco: Never Used  Substance Use Topics  . Alcohol use: Yes    Comment: occ    FAMILY HISTORY: Family History  Problem Relation Age of Onset  . Breast cancer Mother   . Migraines Mother   . Hypertension Father   . Migraines Father   . Migraines Brother   . Migraines Other     EXAM: BP (!) 141/106 (BP Location: Left Arm)   Pulse 92   Temp 98.2 F (36.8 C) (Oral)   Resp (!) 28   Ht 5\' 11"  (1.803 m)   Wt 93.9 kg   LMP 09/18/2019   SpO2 100%   BMI 28.87 kg/m  CONSTITUTIONAL: Alert and oriented and responds appropriately to questions. Well-appearing; well-nourished HEAD: Normocephalic EYES: Conjunctivae clear, pupils appear equal, EOM appear intact ENT: normal nose; moist mucous membranes NECK: Supple, normal ROM CARD: RRR; S1 and S2 appreciated; no murmurs, no clicks, no rubs, no gallops RESP: Normal chest excursion without splinting or tachypnea; breath sounds clear and equal bilaterally; no wheezes, no rhonchi, no rales, no hypoxia or respiratory distress, speaking full sentences ABD/GI: Normal bowel sounds; non-distended; soft, non-tender, no rebound, no guarding, no peritoneal signs, no hepatosplenomegaly BACK:  The back appears normal EXT: Normal ROM in all joints; no deformity noted, no edema; no cyanosis SKIN: Normal color for age and race; warm; no rash on exposed skin NEURO: Moves all extremities equally PSYCH: The patient's mood and manner are appropriate.   MEDICAL DECISION MAKING: Patient here with nausea and vomiting.  She is afebrile with a benign abdominal exam.  Reports similar symptoms of gastroparesis.  Followed by GI at [facility].  She has tried Zofran x2 at home, Phenergan by mouth and  suppository.  Reports Phenergan is normally what helped her the most.  Will give IV fluids, Phenergan here in the ED.  Labs, urine pending.  I do not feel she needs CT imaging at this time as this appears to be a chronic issue for her.  Given benign abdominal exam, low suspicion for appendicitis, colitis, diverticulitis, cholecystitis, pancreatitis, bowel obstruction, perforation, pyelonephritis, kidney stone.  ED PROGRESS:  Patient reports improvement with IV fluids and Phenergan.  Now starting to have muscle cramps in her legs.  She states that this is normal for her and she takes tizanidine and Vicodin.  Will give Toradol, Ativan.  Leg pain significantly improved.  Now having more nausea.  Will give Reglan, Benadryl.  Patient reports nausea going on she is able to tolerate p.o.  Patient has received a second liter of IV fluids.  Labs,  urine unremarkable other than minimally elevated creatinine and moderate amount of ketones.  Reports increasing pain again in the legs.  Will give IV morphine.  Patient reports feeling much better.  She is resting comfortably.  She continues however to be a little bit hypertensive and tachycardic but patient has been states that this is normal for her and they feel comfortable with plan for discharge home.  She has Phenergan for home.  Provided with prescription for Zofran and Reglan to use as needed.  Will discharge.   At this time, I do not feel there is any life-threatening condition present. I have reviewed, interpreted and discussed all results (EKG, imaging, lab, urine as appropriate) and exam findings with patient/family. I have reviewed nursing notes and appropriate previous records.  I feel the patient is safe to be discharged home without further emergent workup and can continue workup as an outpatient as needed. Discussed usual and customary return precautions. Patient/family verbalize understanding and are comfortable with this plan.  Outpatient follow-up has  been provided as needed. All questions have been answered.     EKG Interpretation  Date/Time:  Wednesday October 01 2019 04:49:41 EDT Ventricular Rate:  112 PR Interval:    QRS Duration: 95 QT Interval:  339 QTC Calculation: 463 R Axis:   84 Text Interpretation: Sinus tachycardia Minimal ST depression, diffuse leads Confirmed by Rochele Raring [phone]) on 10/01/2019 4:54:02 AM           [name] was evaluated in Emergency Department on 10/01/2019 for the symptoms described in the history of present illness. She was evaluated in the context of the global COVID-19 pandemic, which necessitated consideration that the patient might be at risk for infection with the SARS-CoV-2 virus that causes COVID-19. Institutional protocols and algorithms that pertain to the evaluation of patients at risk for COVID-19 are in a state of rapid change based on information released by regulatory bodies including the CDC and federal and state organizations. These policies and algorithms were followed during the patient's care in the ED.      Lakecia Deschamps, Layla Maw, DO 10/01/19 [id number]

## 2019-10-02 ENCOUNTER — Inpatient Hospital Stay (HOSPITAL_COMMUNITY)
Admission: EM | Admit: 2019-10-02 | Discharge: 2019-10-08 | DRG: 392 | Disposition: A | Discharge status: Home / Self Care | Payer: BC Managed Care – PPO | Attending: Internal Medicine | Admitting: Internal Medicine

## 2019-10-02 ENCOUNTER — Other Ambulatory Visit: Payer: Self-pay

## 2019-10-02 ENCOUNTER — Encounter (HOSPITAL_COMMUNITY): Payer: Self-pay | Admitting: Emergency Medicine

## 2019-10-02 DIAGNOSIS — D649 Anemia, unspecified: Secondary | ICD-10-CM | POA: Diagnosis not present

## 2019-10-02 DIAGNOSIS — Z8249 Family history of ischemic heart disease and other diseases of the circulatory system: Secondary | ICD-10-CM | POA: Diagnosis not present

## 2019-10-02 DIAGNOSIS — Q7962 Hypermobile Ehlers-Danlos syndrome: Secondary | ICD-10-CM | POA: Diagnosis not present

## 2019-10-02 DIAGNOSIS — N179 Acute kidney failure, unspecified: Secondary | ICD-10-CM | POA: Diagnosis not present

## 2019-10-02 DIAGNOSIS — T391X5A Adverse effect of 4-Aminophenol derivatives, initial encounter: Secondary | ICD-10-CM | POA: Diagnosis present

## 2019-10-02 DIAGNOSIS — K30 Functional dyspepsia: Secondary | ICD-10-CM | POA: Diagnosis not present

## 2019-10-02 DIAGNOSIS — Z79899 Other long term (current) drug therapy: Secondary | ICD-10-CM | POA: Diagnosis not present

## 2019-10-02 DIAGNOSIS — F419 Anxiety disorder, unspecified: Secondary | ICD-10-CM | POA: Diagnosis present

## 2019-10-02 DIAGNOSIS — Z79891 Long term (current) use of opiate analgesic: Secondary | ICD-10-CM | POA: Diagnosis not present

## 2019-10-02 DIAGNOSIS — G43909 Migraine, unspecified, not intractable, without status migrainosus: Secondary | ICD-10-CM | POA: Diagnosis present

## 2019-10-02 DIAGNOSIS — Z8711 Personal history of peptic ulcer disease: Secondary | ICD-10-CM

## 2019-10-02 DIAGNOSIS — Z803 Family history of malignant neoplasm of breast: Secondary | ICD-10-CM | POA: Diagnosis not present

## 2019-10-02 DIAGNOSIS — E559 Vitamin D deficiency, unspecified: Secondary | ICD-10-CM | POA: Diagnosis not present

## 2019-10-02 DIAGNOSIS — K219 Gastro-esophageal reflux disease without esophagitis: Secondary | ICD-10-CM | POA: Diagnosis present

## 2019-10-02 DIAGNOSIS — Z96643 Presence of artificial hip joint, bilateral: Secondary | ICD-10-CM | POA: Diagnosis present

## 2019-10-02 DIAGNOSIS — Q796 Ehlers-Danlos syndrome, unspecified: Secondary | ICD-10-CM | POA: Diagnosis not present

## 2019-10-02 DIAGNOSIS — Z03818 Encounter for observation for suspected exposure to other biological agents ruled out: Secondary | ICD-10-CM | POA: Diagnosis not present

## 2019-10-02 DIAGNOSIS — D509 Iron deficiency anemia, unspecified: Secondary | ICD-10-CM | POA: Diagnosis present

## 2019-10-02 DIAGNOSIS — R509 Fever, unspecified: Secondary | ICD-10-CM | POA: Diagnosis not present

## 2019-10-02 DIAGNOSIS — M1611 Unilateral primary osteoarthritis, right hip: Secondary | ICD-10-CM | POA: Diagnosis not present

## 2019-10-02 DIAGNOSIS — Z881 Allergy status to other antibiotic agents status: Secondary | ICD-10-CM | POA: Diagnosis not present

## 2019-10-02 DIAGNOSIS — Z800 Family history of malignant neoplasm of digestive organs: Secondary | ICD-10-CM

## 2019-10-02 DIAGNOSIS — R112 Nausea with vomiting, unspecified: Principal | ICD-10-CM

## 2019-10-02 DIAGNOSIS — F329 Major depressive disorder, single episode, unspecified: Secondary | ICD-10-CM | POA: Diagnosis not present

## 2019-10-02 DIAGNOSIS — Z20822 Contact with and (suspected) exposure to covid-19: Secondary | ICD-10-CM | POA: Diagnosis present

## 2019-10-02 DIAGNOSIS — I10 Essential (primary) hypertension: Secondary | ICD-10-CM | POA: Diagnosis present

## 2019-10-02 DIAGNOSIS — K449 Diaphragmatic hernia without obstruction or gangrene: Secondary | ICD-10-CM | POA: Diagnosis present

## 2019-10-02 DIAGNOSIS — R110 Nausea: Secondary | ICD-10-CM | POA: Diagnosis not present

## 2019-10-02 DIAGNOSIS — G894 Chronic pain syndrome: Secondary | ICD-10-CM | POA: Diagnosis not present

## 2019-10-02 DIAGNOSIS — R1310 Dysphagia, unspecified: Secondary | ICD-10-CM | POA: Diagnosis present

## 2019-10-02 DIAGNOSIS — Z8719 Personal history of other diseases of the digestive system: Secondary | ICD-10-CM | POA: Diagnosis not present

## 2019-10-02 DIAGNOSIS — K3184 Gastroparesis: Secondary | ICD-10-CM

## 2019-10-02 DIAGNOSIS — K3189 Other diseases of stomach and duodenum: Secondary | ICD-10-CM | POA: Diagnosis not present

## 2019-10-02 LAB — URINALYSIS, ROUTINE W REFLEX MICROSCOPIC
Bilirubin Urine: NEGATIVE
Glucose, UA: NEGATIVE mg/dL
Ketones, ur: 80 mg/dL — AB
Leukocytes,Ua: NEGATIVE
Nitrite: NEGATIVE
Protein, ur: 100 mg/dL — AB
Specific Gravity, Urine: 1.024 (ref 1.005–1.030)
pH: 5 (ref 5.0–8.0)

## 2019-10-02 LAB — COMPREHENSIVE METABOLIC PANEL
ALT: 21 U/L (ref 0–44)
AST: 19 U/L (ref 15–41)
Albumin: 4.2 g/dL (ref 3.5–5.0)
Alkaline Phosphatase: 76 U/L (ref 38–126)
Anion gap: 11 (ref 5–15)
BUN: 10 mg/dL (ref 6–20)
CO2: 25 mmol/L (ref 22–32)
Calcium: 9.4 mg/dL (ref 8.9–10.3)
Chloride: 105 mmol/L (ref 98–111)
Creatinine, Ser: 1.14 mg/dL — ABNORMAL HIGH (ref 0.44–1.00)
GFR calc Af Amer: >60 mL/min (ref >60)
GFR calc non Af Amer: 58 mL/min — ABNORMAL LOW (ref >60)
Glucose, Bld: 137 mg/dL — ABNORMAL HIGH (ref 70–99)
Potassium: 3.4 mmol/L — ABNORMAL LOW (ref 3.5–5.1)
Sodium: 141 mmol/L (ref 135–145)
Total Bilirubin: 0.9 mg/dL (ref 0.3–1.2)
Total Protein: 7.8 g/dL (ref 6.5–8.1)

## 2019-10-02 LAB — CBC
HCT: 38.3 % (ref 36.0–46.0)
Hemoglobin: 12 g/dL (ref 12.0–15.0)
MCH: 24.4 pg — ABNORMAL LOW (ref 26.0–34.0)
MCHC: 31.3 g/dL (ref 30.0–36.0)
MCV: 77.8 fL — ABNORMAL LOW (ref 80.0–100.0)
Platelets: 477 10*3/uL — ABNORMAL HIGH (ref 150–400)
RBC: 4.92 MIL/uL (ref 3.87–5.11)
RDW: 15.3 % (ref 11.5–15.5)
WBC: 8.4 10*3/uL (ref 4.0–10.5)
nRBC: 0 % (ref 0.0–0.2)

## 2019-10-02 LAB — I-STAT BETA HCG BLOOD, ED (MC, WL, AP ONLY): I-stat hCG, quantitative: <5 m[IU]/mL (ref <5)

## 2019-10-02 LAB — LIPASE, BLOOD: Lipase: 24 U/L (ref 11–51)

## 2019-10-02 MED ORDER — SODIUM CHLORIDE 0.9% FLUSH: 3.0000 mL | Freq: Once | INTRAVENOUS | Status: DC

## 2019-10-02 MED ORDER — SODIUM CHLORIDE 0.9 % IV BOLUS
2000.0000 mL | Freq: Once | INTRAVENOUS | Status: AC
  Administered 2019-10-02: 2000 mL via INTRAVENOUS

## 2019-10-02 MED ORDER — PROMETHAZINE HCL 25 MG/ML IJ SOLN
25.0000 mg | Freq: Once | INTRAMUSCULAR | Status: AC
  Administered 2019-10-02: 25 mg via INTRAVENOUS
  Filled 2019-10-02: qty 1

## 2019-10-02 MED ORDER — MORPHINE SULFATE (PF) 4 MG/ML IV SOLN
4.0000 mg | Freq: Once | INTRAVENOUS | Status: AC
  Administered 2019-10-02: 4 mg via INTRAVENOUS
  Filled 2019-10-02: qty 1

## 2019-10-02 MED ORDER — ONDANSETRON HCL 4 MG/2ML IJ SOLN
4.0000 mg | Freq: Once | INTRAMUSCULAR | Status: AC
  Administered 2019-10-02: 4 mg via INTRAVENOUS
  Filled 2019-10-02: qty 2

## 2019-10-02 MED ORDER — CYCLOBENZAPRINE HCL 10 MG PO TABS
5.0000 mg | ORAL_TABLET | Freq: Once | ORAL | Status: AC
  Administered 2019-10-02: 5 mg via ORAL
  Filled 2019-10-02: qty 1

## 2019-10-02 MED ORDER — METOCLOPRAMIDE HCL 5 MG/ML IJ SOLN
10.0000 mg | Freq: Once | INTRAMUSCULAR | Status: AC
  Administered 2019-10-02: 10 mg via INTRAVENOUS
  Filled 2019-10-02: qty 2

## 2019-10-02 NOTE — ED Triage Notes (Signed)
Pt having nausea and vomiting since last Tuesday is been taking Reglan and Zofran with no relief.

## 2019-10-02 NOTE — ED Provider Notes (Signed)
[facility] EMERGENCY DEPARTMENT Provider Note   CSN: [id number] Arrival date & time: 10/02/19  1459     History Chief Complaint  Patient presents with  . Emesis    Angela Harrison is a 44 y.o. female.  The history is provided by the patient and medical records. No language interpreter was used.  Emesis    44 year old female with history of Ehlers-Danlos syndrome, history of gastroparesis presenting for evaluation of nausea and vomiting.  Patient report for nearly a week she has had persistent nausea, and vomiting and feeling dehydrated.  She vomits multiple times in a day without any blood or bowels.  She has normal bowel movement.  She endorsed joint pain and restless leg which is not unusual for her.  She tries taking her antinausea medication including Phenergan, Phenergan suppository, Zofran, and Reglan at home without adequate relief.  She was seen in the ED 2 days ago for symptoms and received treatment with some improvement however her symptoms returned.  Throughout the day today she has vomited at least 4 times.  She tries taking Zofran this morning and this afternoon without relief.  She attributed her symptoms to her gastroparesis related to EDS.  She denies any severe abdominal pain fever or chills.  She denies alcohol tobacco abuse, denies marijuana use.  Past Medical History:  Diagnosis Date  . Anemia    PMH  . Arthritis    oa  . 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 Ehlers-Danlos syndrome type III   . Gastric paresis   . Migraine    migraines  . Syncope    neurocardiogenic takes toprol and zoloft for  . Vitamin D deficiency 12/16/2016    Patient Active Problem List   Diagnosis Date Noted  . Status post hardware removal 12/25/2017  . Closed fracture of left fibula and tibia 12/12/2017  . 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 Unilateral primary osteoarthritis, right hip 09/21/2017  . Status  post total replacement of right hip 09/21/2017  . Vitamin D deficiency 12/16/2016  . Ehlers-Danlos syndrome type III   . Arthritis   . Migraine   . 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 Closed left tibial fracture 04/11/2016  . Fracture tibia/fibula, left, closed, initial encounter 04/11/2016  . Obese 08/25/2015  . S/P left THA, AA 08/24/2015  . PANIC DISORDER WITHOUT AGORAPHOBIA 09/24/2007  . MIGRAINE HEADACHE 09/24/2007  . 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 DYSPNEA 09/24/2007    Past Surgical History:  Procedure Laterality Date  . CHOLECYSTECTOMY    . ESOPHAGOGASTRODUODENOSCOPY ENDOSCOPY     x 2 or 3  . FIBULA FRACTURE SURGERY Left 12/14/2016  . HARDWARE REMOVAL Left 12/14/2016   Procedure: HARDWARE REMOVAL LEFT TIBIA;  Surgeon: Myrene Galas, MD;  Location: [facility] OR;  Service: Orthopedics;  Laterality: Left;  . IM NAILING TIBIA Left 12/14/2016  . KNEE SURGERY Bilateral    2 on each knee  . MAXILLARY ANTROSTOMY    . ORIF ANKLE FRACTURE Left 04/12/2016   Procedure: OPEN REDUCTION INTERNAL FIXATION (ORIF) ANKLE LEFT;  Surgeon: Yolonda Kida, MD;  Location: [facility] OR;  Service: Orthopedics;  Laterality: Left;  . ORIF FIBULA FRACTURE Left 12/14/2016   Procedure: NON-UNION REPAIR LEFT FIBULA FRACTURE;  Surgeon: Myrene Galas, MD;  Location: [facility] OR;  Service: Orthopedics;  Laterality: Left;  . SHOULDER SURGERY Left  x 4  . TIBIA HARDWARE REMOVAL Left 12/14/2016  . TIBIA IM NAIL INSERTION Left 04/12/2016   Procedure: INTRAMEDULLARY (IM) NAIL TIBIAL LEFT;  Surgeon: Yolonda Kida, MD;  Location: [facility] OR;  Service: Orthopedics;  Laterality: Left;  . TIBIA IM NAIL INSERTION Left 12/14/2016   Procedure: INTRAMEDULLARY (IM) NAIL LEFT TIBIAL;  Surgeon: Myrene Galas, MD;  Location: [facility] OR;  Service: Orthopedics;  Laterality: Left;  . TONSILLECTOMY     adenoids also  . TOTAL HIP ARTHROPLASTY Left 08/24/2015    Procedure: LEFT TOTAL HIP ARTHROPLASTY ANTERIOR APPROACH;  Surgeon: Durene Romans, MD;  Location: [facility] ORS;  Service: Orthopedics;  Laterality: Left;  . TOTAL HIP ARTHROPLASTY Right 09/21/2017   Procedure: RIGHT TOTAL HIP ARTHROPLASTY ANTERIOR APPROACH;  Surgeon: Kathryne Hitch, MD;  Location: [facility] ORS;  Service: Orthopedics;  Laterality: Right;     OB History   No obstetric history on file.     Family History  Problem Relation Age of Onset  . Breast cancer Mother   . Migraines Mother   . Hypertension Father   . Migraines Father   . Migraines Brother   . Migraines Other     Social History   Tobacco Use  . Smoking status: Never Smoker  . Smokeless tobacco: Never Used  Vaping Use  . Vaping Use: Never used  Substance Use Topics  . Alcohol use: Yes    Comment: occ  . Drug use: No    Home Medications Prior to Admission medications   Medication Sig Start Date End Date Taking? Authorizing Provider  Galcanezumab-gnlm (EMGALITY) 120 MG/ML SOAJ 120 mg every 30 (thirty) days. 06/02/19  Yes [provider]  HYDROcodone-acetaminophen (NORCO) 10-325 MG tablet Take 1 tablet by mouth every 6 (six) hours as needed for moderate pain.   Yes [provider]  metoprolol succinate (TOPROL-XL) 50 MG 24 hr tablet Take 50 mg by mouth at bedtime.  03/13/16  Yes [provider]  sertraline (ZOLOFT) 50 MG tablet Take 50 mg by mouth at bedtime.  11/27/16  Yes [provider]  SUMAtriptan (IMITREX) 6 MG/0.5ML SOLN injection Inject 6 mg into the skin every 2 (two) hours as needed for migraine or headache. May repeat in 2 hours if headache persists or recurs.   Yes [provider]  tiZANidine (ZANAFLEX) 4 MG tablet Take 4 mg by mouth every 6 (six) hours as needed for muscle spasms.   Yes [provider]  acetaminophen (TYLENOL) 500 MG tablet Take 500-1,000 mg by mouth every 8 (eight) hours as needed for moderate pain.    [provider]    aspirin 81 MG chewable tablet Chew 1 tablet (81 mg total) by mouth 2 (two) times daily. Patient not taking: Reported on 01/03/2018 09/22/17   Kathryne Hitch, MD  docusate sodium (COLACE) 100 MG capsule Take 1 capsule (100 mg total) by mouth 2 (two) times daily. Patient not taking: Reported on 09/11/2017 12/15/16   Montez Morita, PA-C  enoxaparin (LOVENOX) 40 MG/0.4ML injection Inject 0.4 mLs (40 mg total) into the skin daily. Patient not taking: Reported on 09/11/2017 12/16/16   Montez Morita, PA-C  HYDROcodone-acetaminophen [facility]) 5-325 MG tablet Take 1-2 tablets by mouth every 6 (six) hours as needed for moderate pain. Patient not taking: Reported on 10/02/2019 11/01/17   Kirtland Bouchard, PA-C  hydrocortisone cream 1 % Apply 1 application topically daily as needed for itching.    [provider]  meclizine (ANTIVERT) 25 MG tablet Take  25 mg by mouth 3 (three) times daily as needed for dizziness.    [provider]  methocarbamol (ROBAXIN) 750 MG tablet TAKE 1 TABLET (750 MG TOTAL) BY MOUTH EVERY 6 (SIX) HOURS AS NEEDED FOR MUSCLE SPASMS. 10/26/17   Kirtland Bouchard, PA-C  metoCLOPramide (REGLAN) 10 MG tablet Take 1 tablet (10 mg total) by mouth every 8 (eight) hours as needed for nausea. 10/01/19   Ward, Layla Maw, DO  ondansetron (ZOFRAN ODT) 4 MG disintegrating tablet Take 1 tablet (4 mg total) by mouth every 6 (six) hours as needed for nausea or vomiting. 10/01/19   Ward, Layla Maw, DO  ondansetron (ZOFRAN) 8 MG tablet Take 8 mg by mouth every 8 (eight) hours as needed for nausea or vomiting.     [provider]  polyethylene glycol (MIRALAX / GLYCOLAX) packet Take 17 g by mouth daily. Patient not taking: Reported on 09/11/2017 12/15/16   Montez Morita, PA-C  prochlorperazine (COMPAZINE) 10 MG tablet Take 10 mg by mouth every 6 (six) hours as needed for nausea or vomiting.    [provider]  promethazine (PHENERGAN) 25 MG suppository Place 25 mg rectally every 6 (six)  hours as needed for nausea or vomiting.     [provider]    Allergies    Cephalosporins, Cefazolin, and Cephalexin  Review of Systems   Review of Systems  Gastrointestinal: Positive for vomiting.  All other systems reviewed and are negative.   Physical Exam Updated Vital Signs BP (!) 176/118 (BP Location: Right Arm)   Pulse 89   Temp 99.5 F (37.5 C) (Oral)   Resp 18   Ht 5\' 11"  (1.803 m)   Wt 98.9 kg   LMP 09/18/2019   SpO2 100%   BMI 30.41 kg/m   Physical Exam Vitals and nursing note reviewed.  Constitutional:      General: She is not in acute distress.    Appearance: She is well-developed. She is obese.  HENT:     Head: Atraumatic.     Mouth/Throat:     Mouth: Mucous membranes are dry.  Eyes:     Conjunctiva/sclera: Conjunctivae normal.  Cardiovascular:     Rate and Rhythm: Normal rate and regular rhythm.     Pulses: Normal pulses.     Heart sounds: Normal heart sounds.  Pulmonary:     Effort: Pulmonary effort is normal.     Breath sounds: Normal breath sounds.  Abdominal:     General: Abdomen is flat.     Palpations: Abdomen is soft.     Tenderness: There is no abdominal tenderness.  Musculoskeletal:     Cervical back: Neck supple.  Skin:    Findings: No rash.  Neurological:     Mental Status: She is alert and oriented to person, place, and time.  Psychiatric:        Mood and Affect: Mood normal.     ED Results / Procedures / Treatments   Labs (all labs ordered are listed, but only abnormal results are displayed) Labs Reviewed  COMPREHENSIVE METABOLIC PANEL - Abnormal; Notable for the following components:      Result Value   Potassium 3.4 (*)    Glucose, Bld 137 (*)    Creatinine, Ser 1.14 (*)    GFR calc non Af Amer 58 (*)    All other components within normal limits  CBC - Abnormal; Notable for the following components:   MCV 77.8 (*)    MCH 24.4 (*)  Platelets 477 (*)    All other components within normal limits    URINALYSIS, ROUTINE W REFLEX MICROSCOPIC - Abnormal; Notable for the following components:   APPearance HAZY (*)    Hgb urine dipstick MODERATE (*)    Ketones, ur 80 (*)    Protein, ur 100 (*)    Bacteria, UA RARE (*)    All other components within normal limits  SARS CORONAVIRUS 2 BY RT PCR (HOSPITAL ORDER, PERFORMED IN [facility])  LIPASE, BLOOD  I-STAT BETA HCG BLOOD, ED ([facility], [facility], [facility] ONLY)    EKG None  ED ECG REPORT   Date: 10/02/2019  Rate: 82  Rhythm: normal sinus rhythm  QRS Axis: normal  Intervals: normal  ST/T Wave abnormalities: normal  Conduction Disutrbances:none  Narrative Interpretation:   Old EKG Reviewed: unchanged  I have personally reviewed the EKG tracing and agree with the computerized printout as noted.   Radiology No results found.  Procedures Procedures (including critical care time)  Medications Ordered in ED Medications  sodium chloride flush (NS) 0.9 % injection 3 mL (has no administration in time range)  promethazine (PHENERGAN) injection 25 mg (has no administration in time range)  ondansetron (ZOFRAN) injection 4 mg (4 mg Intravenous Given 10/02/19 2137)  morphine 4 MG/ML injection 4 mg (4 mg Intravenous Given 10/02/19 2138)  sodium chloride 0.9 % bolus 2,000 mL (2,000 mLs Intravenous New Bag/Given 10/02/19 2136)  cyclobenzaprine (FLEXERIL) tablet 5 mg (5 mg Oral Given 10/02/19 2139)  metoCLOPramide (REGLAN) injection 10 mg (10 mg Intravenous Given 10/02/19 2145)    ED Course  I have reviewed the triage vital signs and the nursing notes.  Pertinent labs & imaging results that were available during my care of the patient were reviewed by me and considered in my medical decision making (see chart for details).    MDM Rules/Calculators/A&P                          BP (!) 171/100   Pulse 94   Temp 99.5 F (37.5 C) (Oral)   Resp 15   Ht 5\' 11"  (1.803 m)   Wt 98.9 kg   LMP 09/18/2019   SpO2 100%   BMI 30.41 kg/m   Final  Clinical Impression(s) / ED Diagnoses Final diagnoses:  Gastroparesis  Intractable vomiting with nausea, unspecified vomiting type    Rx / DC Orders ED Discharge Orders    None     8:56 PM Patient with history of EDS, gastroparesis here with persistent nausea and vomiting for nearly a week.  She was seen 2 days ago for same was treated and subsequently discharged home.  Symptoms returns.  I suspect her symptom is related to her gastroparesis likely secondary to EDS.  Labs remarkable for a creatinine of 1.14, and UA shows moderate hemoglobin and urine dipsticks with 80 ketones and 100 protein.  Finding is consistent with dehydration from her nausea vomiting.  Normal WBC, pregnancy test is negative, lipase normal.  I do not think advanced imaging is indicated at this time.  Will provide symptomatic treatment, if no improvement of symptoms, may consider admission for symptomatic control.  EKG obtained showed no evidence of prolonged QT.  We will continue with antiemetic.  Patient has received 2 separate antiemetisc and felt a bit better.  She does not feel comfortable with p.o. trial at this time.  We will continue with IV fluid and will reassess.  11:21  PM Patient have received IV fluid.  After medication she is feeling a bit better.  Will provide ginger ale, and crackers.  If she can tolerate this, will discharge home.  11:35 PM After p.o. trial, patient states she feels nauseous, has not vomited yet felt her nausea returns.  Will consult for admission for intractable nausea and vomiting..  Will provide additional antinausea medication.  Will screen for COVID-19 however patient has had Covid vaccination.  Angela Harrison was evaluated in Emergency Department on 10/02/2019 for the symptoms described in the history of present illness. She was evaluated in the context of the global COVID-19 pandemic, which necessitated consideration that the patient might be at risk for infection with the  SARS-CoV-2 virus that causes COVID-19. Institutional protocols and algorithms that pertain to the evaluation of patients at risk for COVID-19 are in a state of rapid change based on information released by regulatory bodies including the CDC and federal and state organizations. These policies and algorithms were followed during the patient's care in the ED.  11:54 PM Appreciate consultation from [facility] Dr. Toniann FailKakrakandy who agrees to see and admit pt for intractable nausea and vomiting.     Fayrene Helperran, Charleen Madera, PA-C 10/02/19 2354    Charlynne PanderYao, David Hsienta, MD 10/03/19 [phone]

## 2019-10-03 ENCOUNTER — Encounter (HOSPITAL_COMMUNITY): Payer: Self-pay | Admitting: Internal Medicine

## 2019-10-03 ENCOUNTER — Observation Stay (HOSPITAL_COMMUNITY): Payer: BC Managed Care – PPO

## 2019-10-03 DIAGNOSIS — R509 Fever, unspecified: Secondary | ICD-10-CM | POA: Diagnosis not present

## 2019-10-03 DIAGNOSIS — G894 Chronic pain syndrome: Secondary | ICD-10-CM | POA: Diagnosis present

## 2019-10-03 DIAGNOSIS — Z803 Family history of malignant neoplasm of breast: Secondary | ICD-10-CM | POA: Diagnosis not present

## 2019-10-03 DIAGNOSIS — K3184 Gastroparesis: Secondary | ICD-10-CM | POA: Diagnosis present

## 2019-10-03 DIAGNOSIS — R112 Nausea with vomiting, unspecified: Secondary | ICD-10-CM | POA: Diagnosis present

## 2019-10-03 DIAGNOSIS — Z881 Allergy status to other antibiotic agents status: Secondary | ICD-10-CM | POA: Diagnosis not present

## 2019-10-03 DIAGNOSIS — K219 Gastro-esophageal reflux disease without esophagitis: Secondary | ICD-10-CM | POA: Diagnosis present

## 2019-10-03 DIAGNOSIS — I10 Essential (primary) hypertension: Secondary | ICD-10-CM | POA: Diagnosis present

## 2019-10-03 DIAGNOSIS — F419 Anxiety disorder, unspecified: Secondary | ICD-10-CM | POA: Diagnosis present

## 2019-10-03 DIAGNOSIS — N179 Acute kidney failure, unspecified: Secondary | ICD-10-CM | POA: Diagnosis present

## 2019-10-03 DIAGNOSIS — T391X5A Adverse effect of 4-Aminophenol derivatives, initial encounter: Secondary | ICD-10-CM | POA: Diagnosis present

## 2019-10-03 DIAGNOSIS — K449 Diaphragmatic hernia without obstruction or gangrene: Secondary | ICD-10-CM | POA: Diagnosis present

## 2019-10-03 DIAGNOSIS — R1310 Dysphagia, unspecified: Secondary | ICD-10-CM | POA: Diagnosis present

## 2019-10-03 DIAGNOSIS — Z79899 Other long term (current) drug therapy: Secondary | ICD-10-CM | POA: Diagnosis not present

## 2019-10-03 DIAGNOSIS — Q7962 Hypermobile Ehlers-Danlos syndrome: Secondary | ICD-10-CM | POA: Diagnosis not present

## 2019-10-03 DIAGNOSIS — Z8249 Family history of ischemic heart disease and other diseases of the circulatory system: Secondary | ICD-10-CM | POA: Diagnosis not present

## 2019-10-03 DIAGNOSIS — Q796 Ehlers-Danlos syndrome, unspecified: Secondary | ICD-10-CM | POA: Diagnosis not present

## 2019-10-03 DIAGNOSIS — G43909 Migraine, unspecified, not intractable, without status migrainosus: Secondary | ICD-10-CM | POA: Diagnosis present

## 2019-10-03 DIAGNOSIS — F329 Major depressive disorder, single episode, unspecified: Secondary | ICD-10-CM | POA: Diagnosis present

## 2019-10-03 DIAGNOSIS — Z96643 Presence of artificial hip joint, bilateral: Secondary | ICD-10-CM | POA: Diagnosis present

## 2019-10-03 DIAGNOSIS — D509 Iron deficiency anemia, unspecified: Secondary | ICD-10-CM | POA: Diagnosis present

## 2019-10-03 DIAGNOSIS — Z79891 Long term (current) use of opiate analgesic: Secondary | ICD-10-CM | POA: Diagnosis not present

## 2019-10-03 DIAGNOSIS — Z8711 Personal history of peptic ulcer disease: Secondary | ICD-10-CM | POA: Diagnosis not present

## 2019-10-03 DIAGNOSIS — Z20822 Contact with and (suspected) exposure to covid-19: Secondary | ICD-10-CM | POA: Diagnosis present

## 2019-10-03 DIAGNOSIS — Z800 Family history of malignant neoplasm of digestive organs: Secondary | ICD-10-CM | POA: Diagnosis not present

## 2019-10-03 LAB — IRON AND TIBC
Iron: 28 ug/dL (ref 28–170)
Saturation Ratios: 6 % — ABNORMAL LOW (ref 10.4–31.8)
TIBC: 434 ug/dL (ref 250–450)
UIBC: 406 ug/dL

## 2019-10-03 LAB — BASIC METABOLIC PANEL
Anion gap: 11 (ref 5–15)
BUN: 8 mg/dL (ref 6–20)
CO2: 23 mmol/L (ref 22–32)
Calcium: 8.6 mg/dL — ABNORMAL LOW (ref 8.9–10.3)
Chloride: 104 mmol/L (ref 98–111)
Creatinine, Ser: 0.92 mg/dL (ref 0.44–1.00)
GFR calc Af Amer: >60 mL/min (ref >60)
GFR calc non Af Amer: >60 mL/min (ref >60)
Glucose, Bld: 114 mg/dL — ABNORMAL HIGH (ref 70–99)
Potassium: 3.1 mmol/L — ABNORMAL LOW (ref 3.5–5.1)
Sodium: 138 mmol/L (ref 135–145)

## 2019-10-03 LAB — CBC
HCT: 36.1 % (ref 36.0–46.0)
Hemoglobin: 11.1 g/dL — ABNORMAL LOW (ref 12.0–15.0)
MCH: 24.2 pg — ABNORMAL LOW (ref 26.0–34.0)
MCHC: 30.7 g/dL (ref 30.0–36.0)
MCV: 78.6 fL — ABNORMAL LOW (ref 80.0–100.0)
Platelets: 390 10*3/uL (ref 150–400)
RBC: 4.59 MIL/uL (ref 3.87–5.11)
RDW: 15.4 % (ref 11.5–15.5)
WBC: 12.3 10*3/uL — ABNORMAL HIGH (ref 4.0–10.5)
nRBC: 0 % (ref 0.0–0.2)

## 2019-10-03 LAB — HEMOGLOBIN A1C
Hgb A1c MFr Bld: 5.8 % — ABNORMAL HIGH (ref 4.8–5.6)
Mean Plasma Glucose: 119.76 mg/dL

## 2019-10-03 LAB — RAPID URINE DRUG SCREEN, HOSP PERFORMED
Amphetamines: NOT DETECTED
Barbiturates: NOT DETECTED
Benzodiazepines: NOT DETECTED
Cocaine: NOT DETECTED
Opiates: POSITIVE — AB
Tetrahydrocannabinol: NOT DETECTED

## 2019-10-03 LAB — FOLATE: Folate: 21.8 ng/mL (ref >5.9)

## 2019-10-03 LAB — FERRITIN: Ferritin: 9 ng/mL — ABNORMAL LOW (ref 11–307)

## 2019-10-03 LAB — HIV ANTIBODY (ROUTINE TESTING W REFLEX): HIV Screen 4th Generation wRfx: NONREACTIVE

## 2019-10-03 LAB — SARS CORONAVIRUS 2 BY RT PCR (HOSPITAL ORDER, PERFORMED IN ~~LOC~~ HOSPITAL LAB): SARS Coronavirus 2: NEGATIVE

## 2019-10-03 MED ORDER — POTASSIUM CHLORIDE CRYS ER 20 MEQ PO TBCR
40.0000 meq | EXTENDED_RELEASE_TABLET | Freq: Once | ORAL | Status: AC
  Administered 2019-10-03: 40 meq via ORAL
  Filled 2019-10-03: qty 2

## 2019-10-03 MED ORDER — FERROUS SULFATE 325 (65 FE) MG PO TABS: 325.0000 mg | ORAL_TABLET | Freq: Every day | ORAL | Status: DC

## 2019-10-03 MED ORDER — METOCLOPRAMIDE HCL 5 MG/ML IJ SOLN
10.0000 mg | Freq: Four times a day (QID) | INTRAMUSCULAR | Status: DC
  Administered 2019-10-03 – 2019-10-08 (×20): 10 mg via INTRAVENOUS
  Filled 2019-10-03 (×20): qty 2

## 2019-10-03 MED ORDER — METOPROLOL SUCCINATE ER 25 MG PO TB24
50.0000 mg | ORAL_TABLET | Freq: Every day | ORAL | Status: DC
  Administered 2019-10-03 (×2): 50 mg via ORAL
  Filled 2019-10-03 (×2): qty 2

## 2019-10-03 MED ORDER — PANTOPRAZOLE SODIUM 40 MG IV SOLR
40.0000 mg | Freq: Two times a day (BID) | INTRAVENOUS | Status: DC
  Administered 2019-10-03 – 2019-10-08 (×10): 40 mg via INTRAVENOUS
  Filled 2019-10-03 (×10): qty 40

## 2019-10-03 MED ORDER — KCL IN DEXTROSE-NACL 20-5-0.9 MEQ/L-%-% IV SOLN
INTRAVENOUS | Status: DC
  Filled 2019-10-03 (×3): qty 1000

## 2019-10-03 MED ORDER — HYDROCODONE-ACETAMINOPHEN 10-325 MG PO TABS
1.0000 | ORAL_TABLET | Freq: Four times a day (QID) | ORAL | Status: DC | PRN
  Administered 2019-10-03 – 2019-10-08 (×16): 1 via ORAL
  Filled 2019-10-03 (×16): qty 1

## 2019-10-03 MED ORDER — TIZANIDINE HCL 2 MG PO TABS
4.0000 mg | ORAL_TABLET | Freq: Four times a day (QID) | ORAL | Status: DC | PRN
  Administered 2019-10-03 – 2019-10-08 (×2): 4 mg via ORAL
  Filled 2019-10-03 (×2): qty 2

## 2019-10-03 MED ORDER — SUMATRIPTAN SUCCINATE 6 MG/0.5ML ~~LOC~~ SOLN
6.0000 mg | SUBCUTANEOUS | Status: DC | PRN
  Administered 2019-10-03: 6 mg via SUBCUTANEOUS
  Filled 2019-10-03 (×2): qty 0.5

## 2019-10-03 MED ORDER — PROMETHAZINE HCL 25 MG/ML IJ SOLN
12.5000 mg | Freq: Four times a day (QID) | INTRAMUSCULAR | Status: DC | PRN
  Administered 2019-10-03 (×2): 12.5 mg via INTRAVENOUS
  Filled 2019-10-03 (×2): qty 1

## 2019-10-03 MED ORDER — SERTRALINE HCL 50 MG PO TABS
50.0000 mg | ORAL_TABLET | Freq: Every day | ORAL | Status: DC
  Administered 2019-10-03 – 2019-10-07 (×6): 50 mg via ORAL
  Filled 2019-10-03 (×6): qty 1

## 2019-10-03 MED ORDER — ONDANSETRON HCL 4 MG/2ML IJ SOLN
4.0000 mg | Freq: Four times a day (QID) | INTRAMUSCULAR | Status: DC | PRN
  Administered 2019-10-03 – 2019-10-06 (×10): 4 mg via INTRAVENOUS
  Filled 2019-10-03 (×10): qty 2

## 2019-10-03 MED ORDER — ENOXAPARIN SODIUM 40 MG/0.4ML ~~LOC~~ SOLN
40.0000 mg | Freq: Every day | SUBCUTANEOUS | Status: DC
  Administered 2019-10-03 – 2019-10-07 (×6): 40 mg via SUBCUTANEOUS
  Filled 2019-10-03 (×6): qty 0.4

## 2019-10-03 MED ORDER — TECHNETIUM TC 99M SULFUR COLLOID
2.0000 | Freq: Once | INTRAVENOUS | Status: AC | PRN
  Administered 2019-10-03: 2 via INTRAVENOUS

## 2019-10-03 MED ORDER — ONDANSETRON HCL 4 MG PO TABS: 4.0000 mg | ORAL_TABLET | Freq: Four times a day (QID) | ORAL | Status: DC | PRN

## 2019-10-03 MED ORDER — LABETALOL HCL 5 MG/ML IV SOLN
10.0000 mg | INTRAVENOUS | Status: DC | PRN
  Administered 2019-10-03 – 2019-10-04 (×2): 10 mg via INTRAVENOUS
  Filled 2019-10-03 (×2): qty 4

## 2019-10-03 NOTE — Progress Notes (Signed)
Arrived to 6n7 from ED at this time. C/O nausea. Ambulated from stretcher to bed- gait steady.

## 2019-10-03 NOTE — H&P (Signed)
History and Physical    Angela Harrison CBJ:[id number] DOB: 02-14-1976 DOA: 10/02/2019  PCP: Farris Has, MD  Patient coming from: Home.  Chief Complaint: Nausea vomiting.  HPI: Angela Harrison is a 44 y.o. female with history of Hurler Danlos syndrome with gastroparesis follows up with gastroenterologist and [facility] has been experiencing more than usual nausea vomiting for the last 1 week.  Denies any abdominal pain or diarrhea.  Despite taking her antiemetics at home patient was denying vomiting unable to keep anything.  ED Course: In the ER patient was given antiemetic despite which patient was having vomiting and admitted for further management.  Labs are remarkable for potassium of 3.4 creatinine 1.1 Covid test was negative.  Blood pressure was elevated.  UA is unremarkable but does show ketones.  EKG shows normal sinus rhythm.  On exam abdomen appears benign.  LFTs are normal.  Review of Systems: As per HPI, rest all negative.   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   Ehlers-Danlos syndrome type III    Gastric paresis    Migraine    migraines   Syncope    neurocardiogenic takes toprol and zoloft for   Vitamin D deficiency 12/16/2016    Past Surgical History:  Procedure Laterality Date   CHOLECYSTECTOMY     ESOPHAGOGASTRODUODENOSCOPY ENDOSCOPY     x 2 or 3   FIBULA FRACTURE SURGERY Left 12/14/2016   HARDWARE REMOVAL Left 12/14/2016   Procedure: HARDWARE REMOVAL LEFT TIBIA;  Surgeon: Myrene Galas, MD;  Location: [facility] OR;  Service: Orthopedics;  Laterality: Left;   IM NAILING TIBIA Left 12/14/2016   KNEE SURGERY Bilateral    2 on each knee   MAXILLARY ANTROSTOMY     ORIF ANKLE FRACTURE Left 04/12/2016   Procedure: OPEN REDUCTION INTERNAL FIXATION (ORIF) ANKLE LEFT;  Surgeon: Yolonda Kida, MD;  Location: [facility] OR;  Service:  Orthopedics;  Laterality: Left;   ORIF FIBULA FRACTURE Left 12/14/2016   Procedure: NON-UNION REPAIR LEFT FIBULA FRACTURE;  Surgeon: Myrene Galas, MD;  Location: [facility] OR;  Service: Orthopedics;  Laterality: Left;   SHOULDER SURGERY Left    x 4   TIBIA HARDWARE REMOVAL Left 12/14/2016   TIBIA IM NAIL INSERTION Left 04/12/2016   Procedure: INTRAMEDULLARY (IM) NAIL TIBIAL LEFT;  Surgeon: Yolonda Kida, MD;  Location: [facility] OR;  Service: Orthopedics;  Laterality: Left;   TIBIA IM NAIL INSERTION Left 12/14/2016   Procedure: INTRAMEDULLARY (IM) NAIL LEFT TIBIAL;  Surgeon: Myrene Galas, MD;  Location: [facility] OR;  Service: Orthopedics;  Laterality: Left;   TONSILLECTOMY     adenoids also   TOTAL HIP ARTHROPLASTY Left 08/24/2015   Procedure: LEFT TOTAL HIP ARTHROPLASTY ANTERIOR APPROACH;  Surgeon: Durene Romans, MD;  Location: [facility] ORS;  Service: Orthopedics;  Laterality: Left;   TOTAL HIP ARTHROPLASTY Right 09/21/2017   Procedure: RIGHT TOTAL HIP ARTHROPLASTY ANTERIOR APPROACH;  Surgeon: Kathryne Hitch, MD;  Location: [facility] ORS;  Service: Orthopedics;  Laterality: Right;     reports that she has never smoked. She has never used smokeless tobacco. She reports current alcohol use. She reports that she does not use drugs.  Allergies  Allergen Reactions   Cephalosporins Hives, Swelling, Rash and Other (See Comments)    Can tolerate Penicillin   Cefazolin Hives, Swelling, Rash and Other (  See Comments)    Can tolerate Penicillin    Cephalexin Hives, Swelling, Rash and Other (See Comments)    Can tolerate Penicillin     Family History  Problem Relation Age of Onset   Breast cancer Mother    Migraines Mother    Hypertension Father    Migraines Father    Migraines Brother    Migraines Other     Prior to Admission medications   Medication Sig Start Date End Date Taking? Authorizing Provider  Galcanezumab-gnlm (EMGALITY) 120 MG/ML SOAJ 120 mg every 30 (thirty) days. 06/02/19   Yes [provider]  HYDROcodone-acetaminophen (NORCO) 10-325 MG tablet Take 1 tablet by mouth every 6 (six) hours as needed for moderate pain.   Yes [provider]  metoCLOPramide (REGLAN) 10 MG tablet Take 1 tablet (10 mg total) by mouth every 8 (eight) hours as needed for nausea. 10/01/19  Yes Ward, Layla Maw, DO  metoprolol succinate (TOPROL-XL) 50 MG 24 hr tablet Take 50 mg by mouth at bedtime.  03/13/16  Yes [provider]  ondansetron (ZOFRAN ODT) 4 MG disintegrating tablet Take 1 tablet (4 mg total) by mouth every 6 (six) hours as needed for nausea or vomiting. 10/01/19  Yes Ward, Layla Maw, DO  promethazine (PHENERGAN) 25 MG suppository Place 25 mg rectally every 6 (six) hours as needed for nausea or vomiting.    Yes [provider]  sertraline (ZOLOFT) 50 MG tablet Take 50 mg by mouth at bedtime.  11/27/16  Yes [provider]  SUMAtriptan (IMITREX) 6 MG/0.5ML SOLN injection Inject 6 mg into the skin every 2 (two) hours as needed for migraine or headache. May repeat in 2 hours if headache persists or recurs.   Yes [provider]  tiZANidine (ZANAFLEX) 4 MG tablet Take 4 mg by mouth every 6 (six) hours as needed for muscle spasms.   Yes [provider]  aspirin 81 MG chewable tablet Chew 1 tablet (81 mg total) by mouth 2 (two) times daily. Patient not taking: Reported on 01/03/2018 09/22/17   Kathryne Hitch, MD  docusate sodium (COLACE) 100 MG capsule Take 1 capsule (100 mg total) by mouth 2 (two) times daily. Patient not taking: Reported on 09/11/2017 12/15/16   Montez Morita, PA-C  enoxaparin (LOVENOX) 40 MG/0.4ML injection Inject 0.4 mLs (40 mg total) into the skin daily. Patient not taking: Reported on 09/11/2017 12/16/16   Montez Morita, PA-C  HYDROcodone-acetaminophen [facility]) 5-325 MG tablet Take 1-2 tablets by mouth every 6 (six) hours as needed for moderate pain. Patient not taking: Reported on 10/02/2019 11/01/17   Kirtland Bouchard, PA-C  methocarbamol (ROBAXIN) 750 MG tablet TAKE 1 TABLET (750 MG TOTAL) BY MOUTH EVERY 6 (SIX) HOURS AS NEEDED FOR MUSCLE SPASMS. Patient not taking: Reported on 10/02/2019 10/26/17   Kirtland Bouchard, PA-C  polyethylene glycol [facility] / Ethelene Hal) packet Take 17 g by mouth daily. Patient not taking: Reported on 09/11/2017 12/15/16   Montez Morita, PA-C    Physical Exam: Constitutional: Moderately built and nourished. Vitals:   10/02/19 2130 10/02/19 2145 10/02/19 2200 10/03/19 0000  BP: (!) 176/117 (!) 174/110 (!) 171/100 (!) 172/113  Pulse: 92 89 94 (!) 106  Resp:  14 15 (!) 22  Temp:      TempSrc:      SpO2: 100% 100% 100% 98%  Weight:      Height:       Eyes: Anicteric no pallor. ENMT: No discharge from the ears  eyes nose or mouth. Neck: No mass felt.  No neck rigidity. Respiratory: No rhonchi or crepitations. Cardiovascular: S1-S2 heard. Abdomen: Soft nontender bowel sounds present. Musculoskeletal: No edema. Skin: No rash. Neurologic: Alert awake oriented to time place and person moves all extremities. Psychiatric: Appears normal.   Labs on Admission: I have personally reviewed following labs and imaging studies  CBC: Recent Labs  Lab 09/30/19 2231 10/02/19 1528  WBC 9.7 8.4  HGB 12.6 12.0  HCT 39.5 38.3  MCV 77.6* 77.8*  PLT 559* 477*   Basic Metabolic Panel: Recent Labs  Lab 09/30/19 2231 10/02/19 1528  NA 138 141  K 3.6 3.4*  CL 100 105  CO2 24 25  GLUCOSE 173* 137*  BUN 10 10  CREATININE 1.19* 1.14*  CALCIUM 9.4 9.4  MG 2.0  --    GFR: Estimated Creatinine Clearance: 80.7 mL/min (A) (by C-G formula based on SCr of 1.14 mg/dL (H)). Liver Function Tests: Recent Labs  Lab 09/30/19 2231 10/02/19 1528  AST 21 19  ALT 19 21  ALKPHOS 84 76  BILITOT 0.6 0.9  PROT 8.2* 7.8  ALBUMIN 4.5 4.2   Recent Labs  Lab 09/30/19 2231 10/02/19 1528  LIPASE 24 24   No results for input(s): AMMONIA in the last 168 hours. Coagulation Profile: No  results for input(s): INR, PROTIME in the last 168 hours. Cardiac Enzymes: No results for input(s): CKTOTAL, CKMB, CKMBINDEX, TROPONINI in the last 168 hours. BNP (last 3 results) No results for input(s): PROBNP in the last 8760 hours. HbA1C: No results for input(s): HGBA1C in the last 72 hours. CBG: No results for input(s): GLUCAP in the last 168 hours. Lipid Profile: No results for input(s): CHOL, HDL, LDLCALC, TRIG, CHOLHDL, LDLDIRECT in the last 72 hours. Thyroid Function Tests: No results for input(s): TSH, T4TOTAL, FREET4, T3FREE, THYROIDAB in the last 72 hours. Anemia Panel: No results for input(s): VITAMINB12, FOLATE, FERRITIN, TIBC, IRON, RETICCTPCT in the last 72 hours. Urine analysis:    Component Value Date/Time   COLORURINE YELLOW 10/02/2019 1718   APPEARANCEUR HAZY (A) 10/02/2019 1718   LABSPEC 1.024 10/02/2019 1718   PHURINE 5.0 10/02/2019 1718   GLUCOSEU NEGATIVE 10/02/2019 1718   HGBUR MODERATE (A) 10/02/2019 1718   BILIRUBINUR NEGATIVE 10/02/2019 1718   KETONESUR 80 (A) 10/02/2019 1718   PROTEINUR 100 (A) 10/02/2019 1718   UROBILINOGEN 0.2 04/05/2013 2109   NITRITE NEGATIVE 10/02/2019 1718   LEUKOCYTESUR NEGATIVE 10/02/2019 1718   Sepsis Labs: @LABRCNTIP (procalcitonin:4,lacticidven:4) )No results found for this or any previous visit (from the past 240 hour(s)).   Radiological Exams on Admission: No results found.  EKG: Independently reviewed.  Normal sinus rhythm.  Assessment/Plan Principal Problem:   Nausea & vomiting Active Problems:   Ehlers-Danlos syndrome type III    1. Intractable nausea vomiting with history of Ehlers-Danlos syndrome with history of gastroparesis.  We will keep patient on clear liquids antiemetics patient states Phenergan helps her mostly and I have ordered that along with as needed Zofran.  IV fluids.  If symptoms does not get better consult GI. 2. Acute renal failure likely from vomiting.  Patient is getting IV fluids follow  metabolic panel. 3. Elevated blood pressure we will keep patient as needed IV labetalol for systolic more than 160. 4. Hypokalemia likely from vomiting replace recheck check magnesium. 5. History of POTS on metoprolol and Zoloft.   DVT prophylaxis: Lovenox. Code Status: Full code. Family Communication: Discussed with patient. Disposition Plan: Home. Consults called: None. Admission  status: Observation.   Eduard ClosArshad N Chasen Mendell MD [facility] Pager [phone].  If 7PM-7AM, please contact night-coverage [email or web address] Password [email or web address]  10/03/2019, 12:25 AM

## 2019-10-03 NOTE — Plan of Care (Signed)

## 2019-10-03 NOTE — Progress Notes (Signed)
Angela Harrison is a 44 y.o. female with history of Hurler Danlos syndrome with gastroparesis follows up with gastroenterologist and [facility] has been experiencing more than usual nausea vomiting for the last 1 week.  Denies any abdominal pain or diarrhea.  Despite taking her antiemetics at home patient was denying vomiting unable to keep anything.  10/03/19:  Seen and examined at her bedside.  Reports persistent nausea and vomiting x 3 this AM.  Gastric emptying study ordered.  [facility] contacted for a consult.  Thus far work up has been negative.  IV antiemetics and IV fluids in place.  Please refer to H&P dictated by my partner Dr. Toniann Fail on 10/03/2019 for further details of the assessment and plan.

## 2019-10-03 NOTE — Consult Note (Signed)
Referring Provider: Dr. Dow Adolph Primary Care Physician:  Farris Has, MD Primary Gastroenterologist:  Dr. Bosie Clos [facility])  Reason for Consultation:  Intractable nausea/vomiting  HPI: Angela Harrison is a 44 y.o. female with past medical history noted below to include Ehlers-Danlos and gastroparesis presenting with intractable nausea and vomiting.  Patient reports history of gastroparesis since 2012 with intermittent nausea and vomiting that usually resolves with anti-emetics.  However, her most recent episode of nausea vomiting started last Thursday 7/1 and had persisted despite anti-emetic use at home (including Phenergan suppositories).  She has not been able to eat or drink anything due to nausea and vomiting.  As a result, she has had some weight loss over the past week but has not had weight loss preceding this episode. Has been on Reglan in the past but has not noted much relief. Has never been on erythromycin.  She also reports intermittent dysphagia occurring approximately every other week.  Typically, it occurs while eating solids, but she feels as if the food is stuck in her esophagus/chest.  During these incidents, she then is unable to swallow any more liquids or foods and typically has vomiting.  In between episodes, she is able to eat both solids and liquids.  Denies any abdominal pain.  Has GERD for which she takes prilosec once per day.  Take 600mg  ibuprofen once per day for chronic pain.  Also takes hydrocodone once per day for pain.  Only drinks alcohol on occasion because it causes vomiting.  Denies any changes in bowel habits.  Has regular bowel movements each day.  Denies diarrhea or constipation.  Denies melena or hematochezia.  Records reviewed: -She had an endoscopy in 2012 which showed duodenal ulcers; negative for H. Pylori. -Esophageal manometry 03/2011: study is consistent with a hypotensive LES and a small hiatal  hernia.  -Gastric emptying 01/2011: Normal  2-hour gastric retention time. Slightly increased 4-hour gastric retention time. -Gastric emptying 12/2010 and did not show any evidence of delayed gastric emptying. -She had upper GI series in 10/2012. -Hydrogen breath test for bacterial overgrowth 11/2013 which was negative.   Past Medical History:  Diagnosis Date  . Anemia    PMH  . Arthritis    oa  . 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 Ehlers-Danlos syndrome type III   . Gastric paresis   . Migraine    migraines  . Syncope    neurocardiogenic takes toprol and zoloft for  . Vitamin D deficiency 12/16/2016    Past Surgical History:  Procedure Laterality Date  . CHOLECYSTECTOMY    . ESOPHAGOGASTRODUODENOSCOPY ENDOSCOPY     x 2 or 3  . FIBULA FRACTURE SURGERY Left 12/14/2016  . HARDWARE REMOVAL Left 12/14/2016   Procedure: HARDWARE REMOVAL LEFT TIBIA;  Surgeon: 12/16/2016, MD;  Location: [facility] OR;  Service: Orthopedics;  Laterality: Left;  . IM NAILING TIBIA Left 12/14/2016  . KNEE SURGERY Bilateral    2 on each knee  . MAXILLARY ANTROSTOMY    . ORIF ANKLE FRACTURE Left 04/12/2016   Procedure: OPEN REDUCTION INTERNAL FIXATION (ORIF) ANKLE LEFT;  Surgeon: 04/14/2016, MD;  Location: [facility] OR;  Service: Orthopedics;  Laterality: Left;  . ORIF FIBULA FRACTURE Left 12/14/2016   Procedure: NON-UNION REPAIR LEFT FIBULA FRACTURE;  Surgeon: 12/16/2016, MD;  Location: [facility] OR;  Service: Orthopedics;  Laterality: Left;  . SHOULDER SURGERY Left    x 4  . TIBIA HARDWARE  REMOVAL Left 12/14/2016  . TIBIA IM NAIL INSERTION Left 04/12/2016   Procedure: INTRAMEDULLARY (IM) NAIL TIBIAL LEFT;  Surgeon: Yolonda KidaJason Patrick Rogers, MD;  [facility];  Service: Orthopedics;  Laterality: Left;  . TIBIA IM NAIL INSERTION Left 12/14/2016   Procedure: INTRAMEDULLARY (IM) NAIL LEFT TIBIAL;  Surgeon: Myrene GalasHandy, Michael, MD;  Location: [facility] OR;  Service: Orthopedics;  Laterality: Left;  . TONSILLECTOMY     adenoids also   . TOTAL HIP ARTHROPLASTY Left 08/24/2015   Procedure: LEFT TOTAL HIP ARTHROPLASTY ANTERIOR APPROACH;  Surgeon: Durene RomansMatthew Olin, MD;  Location: [facility] ORS;  Service: Orthopedics;  Laterality: Left;  . TOTAL HIP ARTHROPLASTY Right 09/21/2017   Procedure: RIGHT TOTAL HIP ARTHROPLASTY ANTERIOR APPROACH;  Surgeon: Kathryne HitchBlackman, Christopher Y, MD;  Location: [facility] ORS;  Service: Orthopedics;  Laterality: Right;    Prior to Admission medications   Medication Sig Start Date End Date Taking? Authorizing Provider  Galcanezumab-gnlm (EMGALITY) 120 MG/ML SOAJ 120 mg every 30 (thirty) days. 06/02/19  Yes [provider]  HYDROcodone-acetaminophen (NORCO) 10-325 MG tablet Take 1 tablet by mouth every 6 (six) hours as needed for moderate pain.   Yes [provider]  metoCLOPramide (REGLAN) 10 MG tablet Take 1 tablet (10 mg total) by mouth every 8 (eight) hours as needed for nausea. 10/01/19  Yes Ward, Layla MawKristen N, DO  metoprolol succinate (TOPROL-XL) 50 MG 24 hr tablet Take 50 mg by mouth at bedtime.  03/13/16  Yes [provider]  ondansetron (ZOFRAN ODT) 4 MG disintegrating tablet Take 1 tablet (4 mg total) by mouth every 6 (six) hours as needed for nausea or vomiting. 10/01/19  Yes Ward, Layla MawKristen N, DO  promethazine (PHENERGAN) 25 MG suppository Place 25 mg rectally every 6 (six) hours as needed for nausea or vomiting.    Yes [provider]  sertraline (ZOLOFT) 50 MG tablet Take 50 mg by mouth at bedtime.  11/27/16  Yes [provider]  SUMAtriptan (IMITREX) 6 MG/0.5ML SOLN injection Inject 6 mg into the skin every 2 (two) hours as needed for migraine or headache. May repeat in 2 hours if headache persists or recurs.   Yes [provider]  tiZANidine (ZANAFLEX) 4 MG tablet Take 4 mg by mouth every 6 (six) hours as needed for muscle spasms.   Yes [provider]  aspirin 81 MG chewable tablet Chew 1 tablet (81 mg total) by mouth 2 (two) times daily. Patient not taking:  Reported on 01/03/2018 09/22/17   Kathryne HitchBlackman, Christopher Y, MD  docusate sodium (COLACE) 100 MG capsule Take 1 capsule (100 mg total) by mouth 2 (two) times daily. Patient not taking: Reported on 09/11/2017 12/15/16   Montez MoritaPaul, Keith, PA-C  enoxaparin (LOVENOX) 40 MG/0.4ML injection Inject 0.4 mLs (40 mg total) into the skin daily. Patient not taking: Reported on 09/11/2017 12/16/16   Montez MoritaPaul, Keith, PA-C  HYDROcodone-acetaminophen [facility](NORCO) 5-325 MG tablet Take 1-2 tablets by mouth every 6 (six) hours as needed for moderate pain. Patient not taking: Reported on 10/02/2019 11/01/17   Kirtland Bouchardlark, Gilbert W, PA-C  methocarbamol (ROBAXIN) 750 MG tablet TAKE 1 TABLET (750 MG TOTAL) BY MOUTH EVERY 6 (SIX) HOURS AS NEEDED FOR MUSCLE SPASMS. Patient not taking: Reported on 10/02/2019 10/26/17   Kirtland Bouchardlark, Gilbert W, PA-C  polyethylene glycol [facility](MIRALAX / Ethelene HalGLYCOLAX) packet Take 17 g by mouth daily. Patient not taking: Reported on 09/11/2017 12/15/16   Montez MoritaPaul, Keith, PA-C    Scheduled Meds: . enoxaparin (LOVENOX) injection  40 mg Subcutaneous QHS  . [START  ON 10/06/2019] ferrous sulfate  325 mg Oral Q breakfast  . metoprolol succinate  50 mg Oral QHS  . pantoprazole (PROTONIX) IV  40 mg Intravenous Q12H  . sertraline  50 mg Oral QHS  . sodium chloride flush  3 mL Intravenous Once   Continuous Infusions: . dextrose 5 % and 0.9 % NaCl with KCl 20 mEq/L 75 mL/hr at 10/03/19 0149   PRN Meds:.HYDROcodone-acetaminophen, labetalol, ondansetron **OR** ondansetron (ZOFRAN) IV, promethazine, SUMAtriptan, tiZANidine  Allergies as of 10/02/2019 - Review Complete 10/02/2019  Allergen Reaction Noted  . Cephalosporins Hives, Swelling, Rash, and Other (See Comments) 08/03/2015  . Cefazolin Hives, Swelling, Rash, and Other (See Comments) 09/24/2007  . Cephalexin Hives, Swelling, Rash, and Other (See Comments) 09/24/2007    Family History  Problem Relation Age of Onset  . Breast cancer Mother   . Migraines Mother   . Hypertension Father   .  Migraines Father   . Migraines Brother   . Migraines Other     Social History   Socioeconomic History  . Marital status: Married    Spouse name: Not on file  . Number of children: Not on file  . Years of education: Not on file  . Highest education level: Not on file  Occupational History  . Not on file  Tobacco Use  . Smoking status: Never Smoker  . Smokeless tobacco: Never Used  Vaping Use  . Vaping Use: Never used  Substance and Sexual Activity  . Alcohol use: Yes    Comment: occ  . Drug use: No  . Sexual activity: Not on file  Other Topics Concern  . Not on file  Social History Narrative  . Not on file   Social Determinants of Health   Financial Resource Strain:   . Difficulty of Paying Living Expenses:   Food Insecurity:   . Worried About Programme researcher, broadcasting/film/video in the Last Year:   . Barista in the Last Year:   Transportation Needs:   . Freight forwarder (Medical):   Marland Kitchen Lack of Transportation (Non-Medical):   Physical Activity:   . Days of Exercise per Week:   . Minutes of Exercise per Session:   Stress:   . Feeling of Stress :   Social Connections:   . Frequency of Communication with Friends and Family:   . Frequency of Social Gatherings with Friends and Family:   . Attends Religious Services:   . Active Member of Clubs or Organizations:   . Attends Banker Meetings:   Marland Kitchen Marital Status:   Intimate Partner Violence:   . Fear of Current or Ex-Partner:   . Emotionally Abused:   Marland Kitchen Physically Abused:   . Sexually Abused:     Review of Systems: Review of Systems  Constitutional: Positive for weight loss (over the past week only). Negative for chills and fever.  HENT: Negative for hearing loss and tinnitus.   Eyes: Negative for pain and redness.  Respiratory: Negative for cough and shortness of breath.   Cardiovascular: Negative for chest pain and palpitations.  Gastrointestinal: Positive for heartburn, nausea and vomiting. Negative  for abdominal pain, blood in stool, constipation, diarrhea and melena.  Genitourinary: Negative for flank pain.  Musculoskeletal: Positive for joint pain and myalgias.  Skin: Negative for itching and rash.  Neurological: Negative for seizures and loss of consciousness.  Endo/Heme/Allergies: Negative for polydipsia. Does not bruise/bleed easily.  Psychiatric/Behavioral: Negative for substance abuse. The patient is not nervous/anxious.  Physical Exam: Vital signs: Vitals:   10/03/19 0514 10/03/19 1627  BP: (!) 175/109 (!) 188/122  Pulse: 98 (!) 101  Resp: 16 19  Temp: 98.6 F (37 C) 99.3 F (37.4 C)  SpO2: 100% 100%   Last BM Date: 09/30/19 Physical Exam Constitutional:      General: She is not in acute distress.    Appearance: Normal appearance.  HENT:     Head: Normocephalic and atraumatic.     Nose: Nose normal.     Mouth/Throat:     Mouth: Mucous membranes are moist.     Pharynx: Oropharynx is clear.  Eyes:     General: No scleral icterus.    Extraocular Movements: Extraocular movements intact.  Cardiovascular:     Rate and Rhythm: Normal rate and regular rhythm.     Pulses: Normal pulses.  Pulmonary:     Effort: Pulmonary effort is normal. No respiratory distress.     Breath sounds: Normal breath sounds.  Abdominal:     General: Bowel sounds are normal. There is no distension.     Palpations: Abdomen is soft. There is no mass.     Tenderness: There is no abdominal tenderness. There is no guarding or rebound.     Hernia: No hernia is present.  Musculoskeletal:        General: No swelling or tenderness.     Cervical back: Normal range of motion and neck supple.  Skin:    General: Skin is warm and dry.  Neurological:     General: No focal deficit present.     Mental Status: She is alert and oriented to person, place, and time.  Psychiatric:        Mood and Affect: Mood normal.        Behavior: Behavior normal.      GI:  Lab Results: Recent Labs     09/30/19 2231 10/02/19 1528 10/03/19 0220  WBC 9.7 8.4 12.3*  HGB 12.6 12.0 11.1*  HCT 39.5 38.3 36.1  PLT 559* 477* 390   BMET Recent Labs    09/30/19 2231 10/02/19 1528 10/03/19 0220  NA 138 141 138  K 3.6 3.4* 3.1*  CL 100 105 104  CO2 24 25 23   GLUCOSE 173* 137* 114*  BUN 10 10 8   CREATININE 1.19* 1.14* 0.92  CALCIUM 9.4 9.4 8.6*   LFT Recent Labs    10/02/19 1528  PROT 7.8  ALBUMIN 4.2  AST 19  ALT 21  ALKPHOS 76  BILITOT 0.9   PT/INR No results for input(s): LABPROT, INR in the last 72 hours.   Studies/Results: No results found.  Impression/Plan: Intractable nausea/vomiting  Recommend Reglan QID.  Discussed side effects with patient, including irreversible tremors/tardive dyskinesia.  Patient verbalized understanding and gave consent to proceed with metoclopramide.  If no relief with Reglan, we will consider erythromycin.  Recommend scheduled antiemetics with as needed as needed.  Pending gastric emptying scan, if symptoms persist, consider EGD.  [facility] will follow.    LOS: 0 days    PA-C 10/03/2019, 4:55 PM  Contact #  [phone]

## 2019-10-04 LAB — CBC WITH DIFFERENTIAL/PLATELET
Abs Immature Granulocytes: 0.04 10*3/uL (ref 0.00–0.07)
Basophils Absolute: 0 10*3/uL (ref 0.0–0.1)
Basophils Relative: 0 %
Eosinophils Absolute: 0.7 10*3/uL — ABNORMAL HIGH (ref 0.0–0.5)
Eosinophils Relative: 7 %
HCT: 38.4 % (ref 36.0–46.0)
Hemoglobin: 12.3 g/dL (ref 12.0–15.0)
Immature Granulocytes: 0 %
Lymphocytes Relative: 10 %
Lymphs Abs: 1.1 10*3/uL (ref 0.7–4.0)
MCH: 24.8 pg — ABNORMAL LOW (ref 26.0–34.0)
MCHC: 32 g/dL (ref 30.0–36.0)
MCV: 77.6 fL — ABNORMAL LOW (ref 80.0–100.0)
Monocytes Absolute: 1.2 10*3/uL — ABNORMAL HIGH (ref 0.1–1.0)
Monocytes Relative: 11 %
Neutro Abs: 7.5 10*3/uL (ref 1.7–7.7)
Neutrophils Relative %: 72 %
Platelets: 418 10*3/uL — ABNORMAL HIGH (ref 150–400)
RBC: 4.95 MIL/uL (ref 3.87–5.11)
RDW: 15.3 % (ref 11.5–15.5)
WBC: 10.5 10*3/uL (ref 4.0–10.5)
nRBC: 0 % (ref 0.0–0.2)

## 2019-10-04 LAB — BASIC METABOLIC PANEL
Anion gap: 11 (ref 5–15)
BUN: 5 mg/dL — ABNORMAL LOW (ref 6–20)
CO2: 26 mmol/L (ref 22–32)
Calcium: 9 mg/dL (ref 8.9–10.3)
Chloride: 100 mmol/L (ref 98–111)
Creatinine, Ser: 0.88 mg/dL (ref 0.44–1.00)
GFR calc Af Amer: >60 mL/min (ref >60)
GFR calc non Af Amer: >60 mL/min (ref >60)
Glucose, Bld: 132 mg/dL — ABNORMAL HIGH (ref 70–99)
Potassium: 3.6 mmol/L (ref 3.5–5.1)
Sodium: 137 mmol/L (ref 135–145)

## 2019-10-04 LAB — VITAMIN B12: Vitamin B-12: 233 pg/mL (ref 180–914)

## 2019-10-04 LAB — MAGNESIUM: Magnesium: 1.9 mg/dL (ref 1.7–2.4)

## 2019-10-04 LAB — PHOSPHORUS: Phosphorus: 2.7 mg/dL (ref 2.5–4.6)

## 2019-10-04 MED ORDER — ERYTHROMYCIN BASE 250 MG PO TABS
500.0000 mg | ORAL_TABLET | Freq: Three times a day (TID) | ORAL | Status: DC
  Administered 2019-10-04 – 2019-10-08 (×11): 500 mg via ORAL
  Filled 2019-10-04 (×14): qty 2

## 2019-10-04 MED ORDER — KCL IN DEXTROSE-NACL 20-5-0.9 MEQ/L-%-% IV SOLN
INTRAVENOUS | Status: DC
  Filled 2019-10-04 (×9): qty 1000

## 2019-10-04 MED ORDER — METOPROLOL TARTRATE 5 MG/5ML IV SOLN
5.0000 mg | Freq: Three times a day (TID) | INTRAVENOUS | Status: DC
  Administered 2019-10-04 – 2019-10-06 (×6): 5 mg via INTRAVENOUS
  Filled 2019-10-04 (×6): qty 5

## 2019-10-04 MED ORDER — FERROUS SULFATE 325 (65 FE) MG PO TABS
325.0000 mg | ORAL_TABLET | Freq: Every day | ORAL | Status: DC
  Administered 2019-10-04 – 2019-10-08 (×4): 325 mg via ORAL
  Filled 2019-10-04 (×4): qty 1

## 2019-10-04 MED ORDER — PROMETHAZINE HCL 25 MG/ML IJ SOLN
12.5000 mg | INTRAMUSCULAR | Status: DC | PRN
  Administered 2019-10-05 – 2019-10-06 (×2): 12.5 mg via INTRAVENOUS
  Filled 2019-10-04 (×2): qty 1

## 2019-10-04 MED ORDER — FOLIC ACID 1 MG PO TABS
1.0000 mg | ORAL_TABLET | Freq: Every day | ORAL | Status: DC
  Administered 2019-10-04 – 2019-10-08 (×4): 1 mg via ORAL
  Filled 2019-10-04 (×4): qty 1

## 2019-10-04 NOTE — Progress Notes (Signed)
PROGRESS NOTE  Angela Harrison FAO:[id number] DOB: Sep 14, 1975 DOA: 10/02/2019 PCP: Farris Has, MD  HPI/Recap of past 24 hours: Angela Montane Ledfordis a 44 y.o.femalewithhistory of Erlers-Danlos syndrome with gastroparesis follows up with gastroenterologist and [facility] has been experiencing more than usual nausea vomiting for the last 1 week. Denies any abdominal pain or diarrhea. Despite taking her antiemetics at home patient was denying vomiting unable to keep anything.  [facility] following, appreciate assistance.    Gastric emptying scan showing minimal delay at 3 to 4 hours timeframe.  Patient is on chronic pain medication and is adamant about staying on them.  10/04/19: Seen and examined.  Reports persistent nausea and vomiting this morning.  Unable to keep any liquids or food down.  Denies any abdominal pain.  Seen by GI, erythromycin added.   Assessment/Plan: Principal Problem:   Nausea & vomiting Active Problems:   Ehlers-Danlos syndrome type III   Intractable nausea and vomiting  Intractable nausea and vomiting possibly related to her chronic use of narcotics Patient is on chronic pain medication and is adamant about staying on them.  States she is on it for the Ehlers-Danlos Care managed by GI Erythromycin added on 10/04/2019 500 mg 3 times daily with meals. Continue IV Reglan 10 mg every 6 hours Continue IV Protonix 40 mg twice daily Continue IV fluid to avoid dehydration  Essential hypertension Blood pressure stable Hold off home Toprol-XL Start Lopressor IV 5 mg 3 times daily Continue to monitor vital signs  Iron deficiency anemia Iron studies suggestive of iron deficiency Start ferrous sulfate 325 mg daily Hemoglobin is stable 12.3 with MCV of 77.  Ehlers-Danlos/chronic pain/chronic opiate use Adamant to continue prior to admission use of narcotic Continue to monitor  Chronic anxiety/depression Stable Continue Zoloft  Resolved  hypokalemia Currently being repleted intravenously along with her IV fluids Serum potassium 3.6 from 3.1  DVT prophylaxis: Lovenox subcu daily Code Status: Full code. Family Communication: Discussed with patient.  Consults called: None.     Status is: Inpatient    Dispo:  Patient From: Home  Planned Disposition: Home  Expected discharge date: 10/06/19  Medically stable for discharge: No, unable to tolerate oral intake due to intractable nausea and vomiting.         Objective: Vitals:   10/03/19 1627 10/03/19 1729 10/03/19 2021 10/04/19 0437  BP: (!) 188/122 (!) 142/96 (!) 159/112 (!) 184/116  Pulse: (!) 101 84 85 100  Resp: 19 16 15 17   Temp: 99.3 F (37.4 C) 98.9 F (37.2 C) 98.4 F (36.9 C) 98.1 F (36.7 C)  TempSrc: Oral Oral Oral Oral  SpO2: 100% 100% 99% 98%  Weight:      Height:        Intake/Output Summary (Last 24 hours) at 10/04/2019 1406 Last data filed at 10/04/2019 0438 Gross per 24 hour  Intake 716.48 ml  Output --  Net 716.48 ml   Filed Weights   10/02/19 1517 10/02/19 1525  Weight: 93.9 kg 98.9 kg    Exam:  . General: 44 y.o. year-old female well developed well nourished in no acute distress.  Alert and oriented x3. . Cardiovascular: Regular rate and rhythm with no rubs or gallops.  No thyromegaly or JVD noted.   54 Respiratory: Clear to auscultation with no wheezes or rales. Good inspiratory effort. . Abdomen: Soft nontender nondistended with normal bowel sounds x4 quadrants. . Musculoskeletal: No lower extremity edema. 2/4 pulses in all 4 extremities. . Skin: No  ulcerative lesions noted or rashes, . Psychiatry: Mood is appropriate for condition and setting   Data Reviewed: CBC: Recent Labs  Lab 09/30/19 2231 10/02/19 1528 10/03/19 0220 10/04/19 0723  WBC 9.7 8.4 12.3* 10.5  NEUTROABS  --   --   --  7.5  HGB 12.6 12.0 11.1* 12.3  HCT 39.5 38.3 36.1 38.4  MCV 77.6* 77.8* 78.6* 77.6*  PLT 559* 477* 390 418*   Basic  Metabolic Panel: Recent Labs  Lab 09/30/19 2231 10/02/19 1528 10/03/19 0220 10/04/19 0723  NA 138 141 138 137  K 3.6 3.4* 3.1* 3.6  CL 100 105 104 100  CO2 24 25 23 26   GLUCOSE 173* 137* 114* 132*  BUN 10 10 8  5*  CREATININE 1.19* 1.14* 0.92 0.88  CALCIUM 9.4 9.4 8.6* 9.0  MG 2.0  --   --  1.9  PHOS  --   --   --  2.7   GFR: Estimated Creatinine Clearance: 104.5 mL/min (by C-G formula based on SCr of 0.88 mg/dL). Liver Function Tests: Recent Labs  Lab 09/30/19 2231 10/02/19 1528  AST 21 19  ALT 19 21  ALKPHOS 84 76  BILITOT 0.6 0.9  PROT 8.2* 7.8  ALBUMIN 4.5 4.2   Recent Labs  Lab 09/30/19 2231 10/02/19 1528  LIPASE 24 24   No results for input(s): AMMONIA in the last 168 hours. Coagulation Profile: No results for input(s): INR, PROTIME in the last 168 hours. Cardiac Enzymes: No results for input(s): CKTOTAL, CKMB, CKMBINDEX, TROPONINI in the last 168 hours. BNP (last 3 results) No results for input(s): PROBNP in the last 8760 hours. HbA1C: Recent Labs    10/03/19 1108  HGBA1C 5.8*   CBG: No results for input(s): GLUCAP in the last 168 hours. Lipid Profile: No results for input(s): CHOL, HDL, LDLCALC, TRIG, CHOLHDL, LDLDIRECT in the last 72 hours. Thyroid Function Tests: No results for input(s): TSH, T4TOTAL, FREET4, T3FREE, THYROIDAB in the last 72 hours. Anemia Panel: Recent Labs    10/03/19 0220 10/04/19 0806  VITAMINB12  --  233  FOLATE 21.8  --   FERRITIN 9*  --   TIBC 434  --   IRON 28  --    Urine analysis:    Component Value Date/Time   COLORURINE YELLOW 10/02/2019 1718   APPEARANCEUR HAZY (A) 10/02/2019 1718   LABSPEC 1.024 10/02/2019 1718   PHURINE 5.0 10/02/2019 1718   GLUCOSEU NEGATIVE 10/02/2019 1718   HGBUR MODERATE (A) 10/02/2019 1718   BILIRUBINUR NEGATIVE 10/02/2019 1718   KETONESUR 80 (A) 10/02/2019 1718   PROTEINUR 100 (A) 10/02/2019 1718   UROBILINOGEN 0.2 04/05/2013 2109   NITRITE NEGATIVE 10/02/2019 1718    LEUKOCYTESUR NEGATIVE 10/02/2019 1718   Sepsis Labs: @LABRCNTIP (procalcitonin:4,lacticidven:4)  ) Recent Results (from the past 240 hour(s))  SARS Coronavirus 2 by RT PCR (hospital order, performed in [facility] hospital lab) Nasopharyngeal Nasopharyngeal Swab     Status: None   Collection Time: 10/03/19 12:00 AM   Specimen: Nasopharyngeal Swab  Result Value Ref Range Status   SARS Coronavirus 2 NEGATIVE NEGATIVE Final    Comment: (NOTE) SARS-CoV-2 target nucleic acids are NOT DETECTED.  The SARS-CoV-2 RNA is generally detectable in upper and lower respiratory specimens during the acute phase of infection. The lowest concentration of SARS-CoV-2 viral copies this assay can detect is 250 copies / mL. A negative result does not preclude SARS-CoV-2 infection and should not be used as the sole basis for treatment or other patient  management decisions.  A negative result may occur with improper specimen collection / handling, submission of specimen other than nasopharyngeal swab, presence of viral mutation(s) within the areas targeted by this assay, and inadequate number of viral copies (<250 copies / mL). A negative result must be combined with clinical observations, patient history, and epidemiological information.  Fact Sheet for Patients:   BoilerBrush.com.cy  Fact Sheet for Healthcare Providers: https://pope.com/  This test is not yet approved or  cleared by the [place] FDA and has been authorized for detection and/or diagnosis of SARS-CoV-2 by FDA under an Emergency Use Authorization (EUA).  This EUA will remain in effect (meaning this test can be used) for the duration of the COVID-19 declaration under Section 564(b)(1) of the Act, 21 U.S.C. section 360bbb-3(b)(1), unless the authorization is terminated or revoked sooner.  Performed at [facility], [address], [place], [place] [address]       Studies: NM  GASTRIC EMPTYING  Result Date: 10/03/2019 CLINICAL DATA:  Gastroparesis.  Nausea. EXAM: NUCLEAR MEDICINE GASTRIC EMPTYING SCAN TECHNIQUE: After oral ingestion of radiolabeled meal, sequential abdominal images were obtained for 4 hours. Percentage of activity emptying the stomach was calculated at 1 hour, 2 hour, 3 hour, and 4 hours. RADIOPHARMACEUTICALS:  2.0 mCi Tc-27m sulfur colloid in standardized meal COMPARISON:  None. FINDINGS: Expected location of the stomach in the left upper quadrant. Ingested meal empties the stomach gradually over the course of the study. 32.0% emptied at 1 hr ( normal >= 10%) 51.0% emptied at 2 hr ( normal >= 40%) 69.0% emptied at 3 hr ( normal >= 70%) 88.0% emptied at 4 hr ( normal >= 90%) IMPRESSION: Mildly delayed gastric emptying study at 3 and 4 hours Electronically Signed   By: Gerome Sam III M.D   On: 10/03/2019 17:25    Scheduled Meds: . enoxaparin (LOVENOX) injection  40 mg Subcutaneous QHS  . erythromycin  500 mg Oral TID with meals  . ferrous sulfate  325 mg Oral Q breakfast  . folic acid  1 mg Oral Daily  . metoCLOPramide (REGLAN) injection  10 mg Intravenous Q6H  . metoprolol succinate  50 mg Oral QHS  . pantoprazole (PROTONIX) IV  40 mg Intravenous Q12H  . sertraline  50 mg Oral QHS  . sodium chloride flush  3 mL Intravenous Once    Continuous Infusions: . dextrose 5 % and 0.9 % NaCl with KCl 20 mEq/L 75 mL/hr at 10/04/19 1330     LOS: 1 day     Darlin Drop, MD [facility] Pager [phone]  If 7PM-7AM, please contact night-coverage [email or web address] Password [email or web address] 10/04/2019, 2:06 PM

## 2019-10-04 NOTE — Progress Notes (Signed)
   10/04/19 2210  Assess: MEWS Score  Temp 98.3 F (36.8 C)  BP (!) 143/88  Pulse Rate (!) 113  Resp 18  SpO2 99 %  O2 Device Room Air  Assess: MEWS Score  MEWS Temp 0  MEWS Systolic 0  MEWS Pulse 2  MEWS RR 0  MEWS LOC 0  MEWS Score 2  MEWS Score Color Yellow  Assess: if the MEWS score is Yellow or Red  Were vital signs taken at a resting state? Yes  Focused Assessment Documented focused assessment  Early Detection of Sepsis Score *See Row Information* Low  MEWS guidelines implemented *See Row Information* No, vital signs rechecked  Treat  MEWS Interventions Administered scheduled meds/treatments  Notify: Charge Nurse/RN  Name of Charge Nurse/RN Notified Celso  Date Charge Nurse/RN Notified 10/04/19  Time Charge Nurse/RN Notified 2232

## 2019-10-04 NOTE — Progress Notes (Signed)
[facility] Gastroenterology Progress Note  Angela Harrison 44 y.o. [birth year]   Subjective: Vomiting with minimal clear liquids reported. Denies abdominal pain. Husband in room.  Objective: Vital signs: Vitals:   10/03/19 2021 10/04/19 0437  BP: (!) 159/112 (!) 184/116  Pulse: 85 100  Resp: 15 17  Temp: 98.4 F (36.9 C) 98.1 F (36.7 C)  SpO2: 99% 98%    Physical Exam: Gen: alert, no acute distress, well-nourished HEENT: anicteric sclera CV: RRR Chest: CTA B Abd: soft, nontender, nondistended, +BS Ext: no edema  Lab Results: Recent Labs    10/03/19 0220 10/04/19 0723  NA 138 137  K 3.1* 3.6  CL 104 100  CO2 23 26  GLUCOSE 114* 132*  BUN 8 5*  CREATININE 0.92 0.88  CALCIUM 8.6* 9.0  MG  --  1.9  PHOS  --  2.7   Recent Labs    10/02/19 1528  AST 19  ALT 21  ALKPHOS 76  BILITOT 0.9  PROT 7.8  ALBUMIN 4.2   Recent Labs    10/03/19 0220 10/04/19 0723  WBC 12.3* 10.5  NEUTROABS  --  7.5  HGB 11.1* 12.3  HCT 36.1 38.4  MCV 78.6* 77.6*  PLT 390 418*      Assessment/Plan: Recurrent nausea/vomiting with gastric emptying scan showing minimal delay at 3-4 hour time frame (of note patient on chronic pain meds). Continue scheduled Metoclopramide. Encouraged more clear liquids as tolerated. Will add Erythromycin and if no improvement in next 1-2 days then will need EGD. Will f/u.   [name] 10/04/2019, 1:23 PM  Questions please call [phone] ID: Angela Harrison, female   DOB: May 25, 1975, 44 y.o.   MRN: [id number]

## 2019-10-05 LAB — CBC
HCT: 36.8 % (ref 36.0–46.0)
Hemoglobin: 11.6 g/dL — ABNORMAL LOW (ref 12.0–15.0)
MCH: 24.7 pg — ABNORMAL LOW (ref 26.0–34.0)
MCHC: 31.5 g/dL (ref 30.0–36.0)
MCV: 78.3 fL — ABNORMAL LOW (ref 80.0–100.0)
Platelets: 362 10*3/uL (ref 150–400)
RBC: 4.7 MIL/uL (ref 3.87–5.11)
RDW: 15.3 % (ref 11.5–15.5)
WBC: 8.7 10*3/uL (ref 4.0–10.5)
nRBC: 0 % (ref 0.0–0.2)

## 2019-10-05 LAB — BASIC METABOLIC PANEL
Anion gap: 10 (ref 5–15)
BUN: <5 mg/dL — ABNORMAL LOW (ref 6–20)
CO2: 25 mmol/L (ref 22–32)
Calcium: 8.7 mg/dL — ABNORMAL LOW (ref 8.9–10.3)
Chloride: 101 mmol/L (ref 98–111)
Creatinine, Ser: 0.82 mg/dL (ref 0.44–1.00)
GFR calc Af Amer: >60 mL/min (ref >60)
GFR calc non Af Amer: >60 mL/min (ref >60)
Glucose, Bld: 121 mg/dL — ABNORMAL HIGH (ref 70–99)
Potassium: 3.1 mmol/L — ABNORMAL LOW (ref 3.5–5.1)
Sodium: 136 mmol/L (ref 135–145)

## 2019-10-05 MED ORDER — POTASSIUM CHLORIDE 10 MEQ/100ML IV SOLN
10.0000 meq | INTRAVENOUS | Status: AC
  Administered 2019-10-05 (×4): 10 meq via INTRAVENOUS
  Filled 2019-10-05 (×4): qty 100

## 2019-10-05 MED ORDER — POTASSIUM CHLORIDE CRYS ER 20 MEQ PO TBCR: 40.0000 meq | EXTENDED_RELEASE_TABLET | Freq: Once | ORAL | Status: DC

## 2019-10-05 NOTE — Progress Notes (Addendum)
   10/05/19 2030  Assess: MEWS Score  Temp (!) 100.6 F (38.1 C)  BP (!) 167/123  Pulse Rate (!) 108  Resp 18  SpO2 97 %  O2 Device Room Air  Assess: MEWS Score  MEWS Temp 1  MEWS Systolic 0  MEWS Pulse 1  MEWS RR 0  MEWS LOC 0  MEWS Score 2  MEWS Score Color Yellow  Assess: if the MEWS score is Yellow or Red  Were vital signs taken at a resting state? Yes  Focused Assessment Documented focused assessment  Early Detection of Sepsis Score *See Row Information* Low  MEWS guidelines implemented *See Row Information* No, vital signs rechecked  Treat  MEWS Interventions Administered scheduled meds/treatments  Notify: Charge Nurse/RN  Name of Charge Nurse/RN Notified Alona Bene RN  Date Charge Nurse/RN Notified 10/05/19  Time Charge Nurse/RN Notified 2045  Document  Patient Outcome Stabilized after interventions  Progress note created (see row info) Yes  Patient assessed @2030  with T 100.6 resulting in tentative yellow mews score.  Pt received PRN Norco and ice packs in the peri and axillary areas with temp reassessed at 2057 with a result of 98.6 and a green mews score.  Pt is resting in bed with no s/s of acute distress.  Will con't to monitor.

## 2019-10-05 NOTE — Progress Notes (Signed)
PROGRESS NOTE  [name] OJJ:[id number] DOB: Oct 10, 1975 DOA: 10/02/2019 PCP: Farris Has, MD  HPI/Recap of past 24 hours: Angela Montane Ledfordis a 44 y.o.femalewithhistory of Erlers-Danlos syndrome with gastroparesis follows up with gastroenterologist and [facility] has been experiencing more than usual nausea vomiting for the last 1 week. Denies any abdominal pain or diarrhea. Despite taking her antiemetics at home patient was denying vomiting unable to keep anything.  [facility] following, appreciate assistance.    Gastric emptying scan showing minimal delay at 3 to 4 hours timeframe.  Patient is on chronic pain medication and is adamant about staying on them.  10/05/19: Seen and examined.  Reports persistent nausea and vomiting last night.  Planned EGD tomorrow.  NPO after midnight.  Erythromycin added yesterday.   Assessment/Plan: Principal Problem:   Nausea & vomiting Active Problems:   Ehlers-Danlos syndrome type III   Intractable nausea and vomiting  Persistent intractable nausea and vomiting possibly related to her chronic use of narcotics Patient is on chronic pain medication and is adamant about staying on them.  States she is on it due to Ehlers-Danlos syndrome. Care managed by GI Erythromycin added on 10/04/2019 500 mg 3 times daily with meals. Continue IV Reglan 10 mg every 6 hours Continue IV Protonix 40 mg twice daily Continue IV fluid to avoid dehydration Plan EGD tomorrow 10/06/2019  Essential hypertension Blood pressure stable Hold off home Toprol-XL Continue Lopressor IV 5 mg 3 times daily Continue to monitor vital signs  Iron deficiency anemia Iron studies suggestive of iron deficiency Continue ferrous sulfate 325 mg daily Hemoglobin is stable 12.3 with MCV of 77.  Ehlers-Danlos/chronic pain/chronic opiate use Adamant to continue prior to admission use of narcotic Continue to monitor  Chronic anxiety/depression Stable Continue  Zoloft  Refractory hypokalemia Currently being repleted intravenously along with her IV fluids Serum potassium 3.6 Repleted intravenously  DVT prophylaxis: Lovenox subcu daily Code Status: Full code. Family Communication: Discussed with patient.  Consults called:  GI.     Status is: Inpatient    Dispo:  Patient From: Home  Planned Disposition: Home  Expected discharge date: 10/07/19  Medically stable for discharge: No, unable to tolerate oral intake due to intractable nausea and vomiting.  EGD planned on 10/06/2019.         Objective: Vitals:   10/04/19 1551 10/04/19 2210 10/04/19 2233 10/05/19 0532  BP: (!) 167/112 (!) 143/88 140/82 (!) 146/99  Pulse: 92 (!) 113 (!) 102 98  Resp: 16 18 16 16   Temp: 98.5 F (36.9 C) 98.3 F (36.8 C) 98.2 F (36.8 C) 98.2 F (36.8 C)  TempSrc: Oral Oral  Oral  SpO2: 98% 99% 99% 96%  Weight:      Height:        Intake/Output Summary (Last 24 hours) at 10/05/2019 1333 Last data filed at 10/05/2019 0532 Gross per 24 hour  Intake 735 ml  Output --  Net 735 ml   Filed Weights   10/02/19 1517 10/02/19 1525  Weight: 93.9 kg 98.9 kg    Exam:  . General: 45 y.o. year-old female well-developed well-nourished no acute stress.  Alert oriented x3.   . Cardiovascular: Regular rate and rhythm no rubs or gallops.   54 Respiratory: Clear to auscultation no wheezes or rales. . Abdomen: Soft nontender normal bowel sounds present. . Musculoskeletal: No lower extremity edema bilaterally.   Marland Kitchen Psychiatry: Mood is appropriate for condition and setting   Data Reviewed: CBC: Recent Labs  Lab  09/30/19 2231 10/02/19 1528 10/03/19 0220 10/04/19 0723 10/05/19 0650  WBC 9.7 8.4 12.3* 10.5 8.7  NEUTROABS  --   --   --  7.5  --   HGB 12.6 12.0 11.1* 12.3 11.6*  HCT 39.5 38.3 36.1 38.4 36.8  MCV 77.6* 77.8* 78.6* 77.6* 78.3*  PLT 559* 477* 390 418* 362   Basic Metabolic Panel: Recent Labs  Lab 09/30/19 2231 10/02/19 1528  10/03/19 0220 10/04/19 0723 10/05/19 0650  NA 138 141 138 137 136  K 3.6 3.4* 3.1* 3.6 3.1*  CL 100 105 104 100 101  CO2 24 25 23 26 25   GLUCOSE 173* 137* 114* 132* 121*  BUN 10 10 8  5* <5*  CREATININE 1.19* 1.14* 0.92 0.88 0.82  CALCIUM 9.4 9.4 8.6* 9.0 8.7*  MG 2.0  --   --  1.9  --   PHOS  --   --   --  2.7  --    GFR: Estimated Creatinine Clearance: 112.2 mL/min (by C-G formula based on SCr of 0.82 mg/dL). Liver Function Tests: Recent Labs  Lab 09/30/19 2231 10/02/19 1528  AST 21 19  ALT 19 21  ALKPHOS 84 76  BILITOT 0.6 0.9  PROT 8.2* 7.8  ALBUMIN 4.5 4.2   Recent Labs  Lab 09/30/19 2231 10/02/19 1528  LIPASE 24 24   No results for input(s): AMMONIA in the last 168 hours. Coagulation Profile: No results for input(s): INR, PROTIME in the last 168 hours. Cardiac Enzymes: No results for input(s): CKTOTAL, CKMB, CKMBINDEX, TROPONINI in the last 168 hours. BNP (last 3 results) No results for input(s): PROBNP in the last 8760 hours. HbA1C: Recent Labs    10/03/19 1108  HGBA1C 5.8*   CBG: No results for input(s): GLUCAP in the last 168 hours. Lipid Profile: No results for input(s): CHOL, HDL, LDLCALC, TRIG, CHOLHDL, LDLDIRECT in the last 72 hours. Thyroid Function Tests: No results for input(s): TSH, T4TOTAL, FREET4, T3FREE, THYROIDAB in the last 72 hours. Anemia Panel: Recent Labs    10/03/19 0220 10/04/19 0806  VITAMINB12  --  233  FOLATE 21.8  --   FERRITIN 9*  --   TIBC 434  --   IRON 28  --    Urine analysis:    Component Value Date/Time   COLORURINE YELLOW 10/02/2019 1718   APPEARANCEUR HAZY (A) 10/02/2019 1718   LABSPEC 1.024 10/02/2019 1718   PHURINE 5.0 10/02/2019 1718   GLUCOSEU NEGATIVE 10/02/2019 1718   HGBUR MODERATE (A) 10/02/2019 1718   BILIRUBINUR NEGATIVE 10/02/2019 1718   KETONESUR 80 (A) 10/02/2019 1718   PROTEINUR 100 (A) 10/02/2019 1718   UROBILINOGEN 0.2 04/05/2013 2109   NITRITE NEGATIVE 10/02/2019 1718   LEUKOCYTESUR  NEGATIVE 10/02/2019 1718   Sepsis Labs: @LABRCNTIP (procalcitonin:4,lacticidven:4)  ) Recent Results (from the past 240 hour(s))  SARS Coronavirus 2 by RT PCR (hospital order, performed in [facility] hospital lab) Nasopharyngeal Nasopharyngeal Swab     Status: None   Collection Time: 10/03/19 12:00 AM   Specimen: Nasopharyngeal Swab  Result Value Ref Range Status   SARS Coronavirus 2 NEGATIVE NEGATIVE Final    Comment: (NOTE) SARS-CoV-2 target nucleic acids are NOT DETECTED.  The SARS-CoV-2 RNA is generally detectable in upper and lower respiratory specimens during the acute phase of infection. The lowest concentration of SARS-CoV-2 viral copies this assay can detect is 250 copies / mL. A negative result does not preclude SARS-CoV-2 infection and should not be used as the sole basis for treatment  or other patient management decisions.  A negative result may occur with improper specimen collection / handling, submission of specimen other than nasopharyngeal swab, presence of viral mutation(s) within the areas targeted by this assay, and inadequate number of viral copies (<250 copies / mL). A negative result must be combined with clinical observations, patient history, and epidemiological information.  Fact Sheet for Patients:   BoilerBrush.com.cy  Fact Sheet for Healthcare Providers: https://pope.com/  This test is not yet approved or  cleared by the [place] FDA and has been authorized for detection and/or diagnosis of SARS-CoV-2 by FDA under an Emergency Use Authorization (EUA).  This EUA will remain in effect (meaning this test can be used) for the duration of the COVID-19 declaration under Section 564(b)(1) of the Act, 21 U.S.C. section 360bbb-3(b)(1), unless the authorization is terminated or revoked sooner.  Performed at [facility], [address], [place], [place] [address]       Studies: No results  found.  Scheduled Meds: . enoxaparin (LOVENOX) injection  40 mg Subcutaneous QHS  . erythromycin  500 mg Oral TID with meals  . ferrous sulfate  325 mg Oral Q breakfast  . folic acid  1 mg Oral Daily  . metoCLOPramide (REGLAN) injection  10 mg Intravenous Q6H  . metoprolol tartrate  5 mg Intravenous Q8H  . pantoprazole (PROTONIX) IV  40 mg Intravenous Q12H  . sertraline  50 mg Oral QHS  . sodium chloride flush  3 mL Intravenous Once    Continuous Infusions: . dextrose 5 % and 0.9 % NaCl with KCl 20 mEq/L 75 mL/hr at 10/05/19 0250  . potassium chloride 10 mEq (10/05/19 1258)     LOS: 2 days     Darlin Drop, MD [facility] Pager [phone]  If 7PM-7AM, please contact night-coverage [email or web address] Password [email or web address] 10/05/2019, 1:33 PM

## 2019-10-05 NOTE — Progress Notes (Signed)
[facility] Gastroenterology Progress Note  Angela Harrison 44 y.o. 06-Apr-1975   Subjective: Vomited clear liquid dinner. Tolerated clear liquids this morning. Denies abdominal pain.  Objective: Vital signs: Vitals:   10/04/19 2233 10/05/19 0532  BP: 140/82 (!) 146/99  Pulse: (!) 102 98  Resp: 16 16  Temp: 98.2 F (36.8 C) 98.2 F (36.8 C)  SpO2: 99% 96%    Physical Exam: Gen: alert, no acute distress  HEENT: anicteric sclera CV: RRR Chest: CTA B Abd: soft, nontender, nondistended, +BS Ext: no edema  Lab Results: Recent Labs    10/04/19 0723 10/05/19 0650  NA 137 136  K 3.6 3.1*  CL 100 101  CO2 26 25  GLUCOSE 132* 121*  BUN 5* <5*  CREATININE 0.88 0.82  CALCIUM 9.0 8.7*  MG 1.9  --   PHOS 2.7  --    Recent Labs    10/02/19 1528  AST 19  ALT 21  ALKPHOS 76  BILITOT 0.9  PROT 7.8  ALBUMIN 4.2   Recent Labs    10/04/19 0723 10/05/19 0650  WBC 10.5 8.7  NEUTROABS 7.5  --   HGB 12.3 11.6*  HCT 38.4 36.8  MCV 77.6* 78.3*  PLT 418* 362      Assessment/Plan: Recurrent nausea/vomiting with minimal delay on gastric emptying scan while on chronic pain meds. On Metoclopramide and Erythromycin and tolerating clear liquids today but not yesterday. Husband and patient want EGD tomorrow because of her recurrent N/V. Will keep on clear liquid diet today. NPO p MN for EGD to be done by Angela Harrison tomorrow. Angela Harrison aware.   Angela Harrison 10/05/2019, 11:07 AM  Questions please call [phone] ID: Angela Harrison, female   DOB: March 31, 1975, 44 y.o.   MRN: [id number]

## 2019-10-05 NOTE — Anesthesia Preprocedure Evaluation (Addendum)
Anesthesia Evaluation  Patient identified by MRN, date of birth, ID band Patient awake    Reviewed: Allergy & Precautions, NPO status , Patient's Chart, lab work & pertinent test results  Airway Mallampati: II  TM Distance: >3 FB Neck ROM: Full    Dental no notable dental hx. (+) Teeth Intact, Dental Advisory Given   Pulmonary shortness of breath,    Pulmonary exam normal breath sounds clear to auscultation       Cardiovascular Exercise Tolerance: Good negative cardio ROS Normal cardiovascular exam Rhythm:Regular Rate:Normal     Neuro/Psych  Headaches, PSYCHIATRIC DISORDERS Anxiety    GI/Hepatic negative GI ROS, Neg liver ROS,   Endo/Other  negative endocrine ROS  Renal/GU negative Renal ROS     Musculoskeletal  (+) Arthritis ,   Abdominal (+) + obese,   Peds  Hematology  (+) anemia ,   Anesthesia Other Findings   Reproductive/Obstetrics                            Anesthesia Physical Anesthesia Plan  ASA: II  Anesthesia Plan: MAC   Post-op Pain Management:    Induction: Intravenous  PONV Risk Score and Plan: Treatment may vary due to age or medical condition  Airway Management Planned: Nasal Cannula and Natural Airway  Additional Equipment:   Intra-op Plan:   Post-operative Plan:   Informed Consent:     Dental advisory given  Plan Discussed with:   Anesthesia Plan Comments:         Anesthesia Quick Evaluation

## 2019-10-06 ENCOUNTER — Inpatient Hospital Stay (HOSPITAL_COMMUNITY): Payer: BC Managed Care – PPO | Admitting: Anesthesiology

## 2019-10-06 ENCOUNTER — Encounter (HOSPITAL_COMMUNITY): Payer: Self-pay | Admitting: Internal Medicine

## 2019-10-06 ENCOUNTER — Encounter (HOSPITAL_COMMUNITY)
Admission: EM | Disposition: A | Discharge status: Home / Self Care | Payer: Self-pay | Source: Home / Self Care | Attending: Internal Medicine

## 2019-10-06 ENCOUNTER — Other Ambulatory Visit: Payer: Self-pay | Admitting: Physician Assistant

## 2019-10-06 HISTORY — PX: ESOPHAGOGASTRODUODENOSCOPY (EGD) WITH PROPOFOL: SHX5813

## 2019-10-06 HISTORY — PX: BIOPSY: SHX5522

## 2019-10-06 LAB — BASIC METABOLIC PANEL
Anion gap: 9 (ref 5–15)
BUN: <5 mg/dL — ABNORMAL LOW (ref 6–20)
CO2: 25 mmol/L (ref 22–32)
Calcium: 9 mg/dL (ref 8.9–10.3)
Chloride: 103 mmol/L (ref 98–111)
Creatinine, Ser: 0.86 mg/dL (ref 0.44–1.00)
GFR calc Af Amer: >60 mL/min (ref >60)
GFR calc non Af Amer: >60 mL/min (ref >60)
Glucose, Bld: 105 mg/dL — ABNORMAL HIGH (ref 70–99)
Potassium: 3.6 mmol/L (ref 3.5–5.1)
Sodium: 137 mmol/L (ref 135–145)

## 2019-10-06 SURGERY — ESOPHAGOGASTRODUODENOSCOPY (EGD) WITH PROPOFOL
Anesthesia: Monitor Anesthesia Care

## 2019-10-06 MED ORDER — LACTATED RINGERS IV SOLN: INTRAVENOUS | Status: DC

## 2019-10-06 MED ORDER — METOPROLOL TARTRATE 5 MG/5ML IV SOLN: 5.0000 mg | Freq: Four times a day (QID) | INTRAVENOUS | Status: DC | PRN

## 2019-10-06 MED ORDER — METOPROLOL SUCCINATE ER 25 MG PO TB24
50.0000 mg | ORAL_TABLET | Freq: Every day | ORAL | Status: DC
  Administered 2019-10-06 – 2019-10-07 (×3): 50 mg via ORAL
  Filled 2019-10-06 (×3): qty 2

## 2019-10-06 MED ORDER — LABETALOL HCL 5 MG/ML IV SOLN
5.0000 mg | Freq: Once | INTRAVENOUS | Status: AC
  Administered 2019-10-06: 5 mg via INTRAVENOUS

## 2019-10-06 MED ORDER — SODIUM CHLORIDE 0.9 % IV SOLN: INTRAVENOUS | Status: DC

## 2019-10-06 MED ORDER — METOPROLOL TARTRATE 5 MG/5ML IV SOLN: 5.0000 mg | Freq: Three times a day (TID) | INTRAVENOUS | Status: DC | PRN

## 2019-10-06 MED ORDER — DEXMEDETOMIDINE HCL 200 MCG/2ML IV SOLN
INTRAVENOUS | Status: DC | PRN
  Administered 2019-10-06 (×2): 4 ug via INTRAVENOUS
  Administered 2019-10-06: 12 ug via INTRAVENOUS

## 2019-10-06 MED ORDER — LABETALOL HCL 5 MG/ML IV SOLN
INTRAVENOUS | Status: DC | PRN
  Administered 2019-10-06: 5 mg via INTRAVENOUS

## 2019-10-06 MED ORDER — PROPOFOL 500 MG/50ML IV EMUL
INTRAVENOUS | Status: DC | PRN
  Administered 2019-10-06: 125 ug/kg/min via INTRAVENOUS

## 2019-10-06 MED ORDER — PROPOFOL 10 MG/ML IV BOLUS
INTRAVENOUS | Status: DC | PRN
  Administered 2019-10-06: 30 mg via INTRAVENOUS

## 2019-10-06 MED ORDER — LABETALOL HCL 5 MG/ML IV SOLN
INTRAVENOUS | Status: AC
  Filled 2019-10-06: qty 4

## 2019-10-06 SURGICAL SUPPLY — 14 items

## 2019-10-06 NOTE — Op Note (Signed)
[facility] Patient Name: Angela Harrison Procedure Date : 10/06/2019 MRN: [id number] Attending MD: Bernette Redbird , MD Date of Birth: 1975-09-06 CSN: [id number] Age: 44 Admit Type: Inpatient Procedure:                Upper GI endoscopy Indications:              Nausea with vomiting Providers:                Bernette Redbird, MD, Vicki Mallet, RN, Lawson Radar, Technician, Leanne Lovely, Technician,                            Irean Hong, CRNA Referring MD:              Medicines:                Monitored Anesthesia Care Complications:            No immediate complications. Estimated Blood Loss:     Estimated blood loss was minimal. Procedure:                Pre-Anesthesia Assessment:                           - Prior to the procedure, a History and Physical                            was performed, and patient medications and                            allergies were reviewed. The patient's tolerance of                            previous anesthesia was also reviewed. The risks                            and benefits of the procedure and the sedation                            options and risks were discussed with the patient.                            All questions were answered, and informed consent                            was obtained. Prior Anticoagulants: The patient has                            taken Lovenox (enoxaparin), last dose was 1 day                            prior to procedure. ASA Grade Assessment: II - A  patient with mild systemic disease. After reviewing                            the risks and benefits, the patient was deemed in                            satisfactory condition to undergo the procedure.                           After obtaining informed consent, the endoscope was                            passed under direct vision. Throughout the                            procedure,  the patient's blood pressure, pulse, and                            oxygen saturations were monitored continuously. The                            GIF-H190 ([id number]) Olympus gastroscope was                            introduced through the mouth, and advanced to the                            second part of duodenum. The upper GI endoscopy was                            accomplished without difficulty. The patient                            tolerated the procedure well. Scope In: Scope Out: Findings:      The larynx was normal.      A 3 cm hiatal hernia was present. The diaphragmatic hiatus was somewhat       patulous.      There was a question of some intermittent "stacked ring" appearance in       the esophagus so biopsies were obtained 5 cm above the GE junction.      Bilious fluid (small to moderate amount) was found on the greater       curvature of the stomach.      The exam of the stomach was otherwise normal. Specifically, there was no       overt gastric erythema to suggest bile reflux gastritis, nor pyloric       channel stenosis. No retained food.      The cardia and gastric fundus were normal on retroflexion apart from the       hiatal hernia with patulous hiatus seen from the inferior perspective.       Antral biopsies were obtained to check for bile reflux gastritis or H.       pylori infection.      The examined duodenum was normal. Biopsies were taken with a cold       forceps  for histology. Impression:               - Normal larynx.                           - 3 cm hiatal hernia.                           - Bilious gastric fluid. This could go along with                            gastric dysmotility.                           - Normal examined duodenum. Biopsied.                           - No anatomic cause for nausea and vomiting seen. Recommendation:           - Await pathology results.                           - Continue present medications.                            - Await pathology results.                           - Frequent small aliquots of clear liquids. Procedure Code(s):        --- Professional ---                           [phone], Esophagogastroduodenoscopy, flexible,                            transoral; with biopsy, single or multiple Diagnosis Code(s):        --- Professional ---                           R11.2, Nausea with vomiting, unspecified CPT copyright 2019 American Medical Association. All rights reserved. The codes documented in this report are preliminary and upon coder review may  be revised to meet current compliance requirements. Bernette Redbird, MD 10/06/2019 10:44:27 AM This report has been signed electronically. Number of Addenda: 0

## 2019-10-06 NOTE — Anesthesia Postprocedure Evaluation (Signed)
Anesthesia Post Note  Patient: Angela Harrison  Procedure(s) Performed: ESOPHAGOGASTRODUODENOSCOPY (EGD) WITH PROPOFOL (N/A ) BIOPSY     Patient location during evaluation: Endoscopy Anesthesia Type: MAC Level of consciousness: awake and alert Pain management: pain level controlled Vital Signs Assessment: post-procedure vital signs reviewed and stable Respiratory status: spontaneous breathing, nonlabored ventilation, respiratory function stable and patient connected to nasal cannula oxygen Cardiovascular status: blood pressure returned to baseline and stable Postop Assessment: no apparent nausea or vomiting Anesthetic complications: no   No complications documented.  Last Vitals:  Vitals:   10/06/19 1100 10/06/19 1352  BP: (!) 137/101 (!) 178/125  Pulse: 95 (!) 106  Resp: 17 16  Temp: 37.5 C 37 C  SpO2: 98% 100%    Last Pain:  Vitals:   10/06/19 1352  TempSrc: Oral  PainSc:                  [place]et [place]

## 2019-10-06 NOTE — Progress Notes (Addendum)
PROGRESS NOTE  Angela Harrison OQH:[id number] DOB: 04-May-1975 DOA: 10/02/2019 PCP: Farris Has, MD  HPI/Recap of past 24 hours: Angela Montane Ledfordis a 44 y.o.femalewithhistory of Erlers-Danlos syndrome with gastroparesis follows up with gastroenterologist and [facility] has been experiencing more than usual nausea vomiting for the last 1 week. Denies any abdominal pain or diarrhea. Despite taking her antiemetics at home patient was denying vomiting unable to keep anything.  [facility] following, appreciate assistance.    Gastric emptying scan showing minimal delay at 3 to 4 hours timeframe.  Patient is on chronic pain medication and is adamant about staying on them.  10/06/19: Seen and examined.  Reports persistent nausea and vomiting. EGD done 10/06/19 Dr. Matthias Hughs essentially unrevealing and showing evidence of dysmotility. Biopsies taken.  Assessment/Plan: Principal Problem:   Nausea & vomiting Active Problems:   Ehlers-Danlos syndrome type III   Intractable nausea and vomiting  Persistent intractable nausea and vomiting possibly related to her chronic use of narcotics post EGD 10/06/19, Dr. Matthias Hughs, unrevealing Patient is on chronic pain medication and is adamant about staying on them.  States she is on it due to Ehlers-Danlos syndrome. Care managed by GI Erythromycin added on 10/04/2019 500 mg 3 times daily with meals.  IV Reglan 10 mg every 6 hours  IV Protonix 40 mg twice daily  IV fluid to avoid dehydration Plan per GI  Essential hypertension, uncontrolled Resume home meds Toprol XL Continue Lopressor IV 5 mg 3 times daily PRN Continue to monitor vital signs  Fever, suspect reactive No leukocytosis Continue to closely monitor Work up if recurs  Iron deficiency anemia Iron studies suggestive of iron deficiency Continue ferrous sulfate 325 mg daily Hemoglobin is stable 12.3 with MCV of 77.  Ehlers-Danlos/chronic pain/chronic opiate use Adamant to continue  prior to admission use of narcotic Continue to monitor  Chronic anxiety/depression Stable Continue Zoloft  Resolved Refractory hypokalemia Currently being repleted intravenously along with her IV fluids Serum potassium 3.6 Repleted intravenously  DVT prophylaxis: Lovenox subcu daily Code Status: Full code. Family Communication: Discussed with patient.  Consults called:  GI.     Status is: Inpatient    Dispo:  Patient From: Home  Planned Disposition: Home  Expected discharge date: 10/07/19  Medically stable for discharge: No, unable to tolerate oral intake due to intractable nausea and vomiting.          Objective: Vitals:   10/06/19 1050 10/06/19 1100 10/06/19 1352 10/06/19 1539  BP: (!) 124/94 (!) 137/101 (!) 178/125 (!) 173/110  Pulse: 98 95 (!) 106 94  Resp: (!) 21 17 16    Temp:  99.5 F (37.5 C) 98.6 F (37 C)   TempSrc:  Oral Oral   SpO2: 97% 98% 100%   Weight:      Height:        Intake/Output Summary (Last 24 hours) at 10/06/2019 1608 Last data filed at 10/06/2019 1353 Gross per 24 hour  Intake 0 ml  Output --  Net 0 ml   Filed Weights   10/02/19 1517 10/02/19 1525 10/06/19 0953  Weight: 93.9 kg 98.9 kg 93 kg    Exam: no significant changes from prior exam  . General: 44 y.o. year-old female WD WN nAD A&O x 3 . Cardiovascular: RRR no rubs or gallops  . Respiratory: CTA no wheezes or rales . Abdomen:soft NT ND NBS . Musculoskeletal: No LE edema . Psychiatry: Mood is appropriate   Data Reviewed: CBC: Recent Labs  Lab 09/30/19 2231  10/02/19 1528 10/03/19 0220 10/04/19 0723 10/05/19 0650  WBC 9.7 8.4 12.3* 10.5 8.7  NEUTROABS  --   --   --  7.5  --   HGB 12.6 12.0 11.1* 12.3 11.6*  HCT 39.5 38.3 36.1 38.4 36.8  MCV 77.6* 77.8* 78.6* 77.6* 78.3*  PLT 559* 477* 390 418* 362   Basic Metabolic Panel: Recent Labs  Lab 09/30/19 2231 09/30/19 2231 10/02/19 1528 10/03/19 0220 10/04/19 0723 10/05/19 0650 10/06/19 0158  NA  138   < > 141 138 137 136 137  K 3.6   < > 3.4* 3.1* 3.6 3.1* 3.6  CL 100   < > 105 104 100 101 103  CO2 24   < > 25 23 26 25 25   GLUCOSE 173*   < > 137* 114* 132* 121* 105*  BUN 10   < > 10 8 5* <5* <5*  CREATININE 1.19*   < > 1.14* 0.92 0.88 0.82 0.86  CALCIUM 9.4   < > 9.4 8.6* 9.0 8.7* 9.0  MG 2.0  --   --   --  1.9  --   --   PHOS  --   --   --   --  2.7  --   --    < > = values in this interval not displayed.   GFR: Estimated Creatinine Clearance: 103.9 mL/min (by C-G formula based on SCr of 0.86 mg/dL). Liver Function Tests: Recent Labs  Lab 09/30/19 2231 10/02/19 1528  AST 21 19  ALT 19 21  ALKPHOS 84 76  BILITOT 0.6 0.9  PROT 8.2* 7.8  ALBUMIN 4.5 4.2   Recent Labs  Lab 09/30/19 2231 10/02/19 1528  LIPASE 24 24   No results for input(s): AMMONIA in the last 168 hours. Coagulation Profile: No results for input(s): INR, PROTIME in the last 168 hours. Cardiac Enzymes: No results for input(s): CKTOTAL, CKMB, CKMBINDEX, TROPONINI in the last 168 hours. BNP (last 3 results) No results for input(s): PROBNP in the last 8760 hours. HbA1C: No results for input(s): HGBA1C in the last 72 hours. CBG: No results for input(s): GLUCAP in the last 168 hours. Lipid Profile: No results for input(s): CHOL, HDL, LDLCALC, TRIG, CHOLHDL, LDLDIRECT in the last 72 hours. Thyroid Function Tests: No results for input(s): TSH, T4TOTAL, FREET4, T3FREE, THYROIDAB in the last 72 hours. Anemia Panel: Recent Labs    10/04/19 0806  VITAMINB12 233   Urine analysis:    Component Value Date/Time   COLORURINE YELLOW 10/02/2019 1718   APPEARANCEUR HAZY (A) 10/02/2019 1718   LABSPEC 1.024 10/02/2019 1718   PHURINE 5.0 10/02/2019 1718   GLUCOSEU NEGATIVE 10/02/2019 1718   HGBUR MODERATE (A) 10/02/2019 1718   BILIRUBINUR NEGATIVE 10/02/2019 1718   KETONESUR 80 (A) 10/02/2019 1718   PROTEINUR 100 (A) 10/02/2019 1718   UROBILINOGEN 0.2 04/05/2013 2109   NITRITE NEGATIVE 10/02/2019 1718    LEUKOCYTESUR NEGATIVE 10/02/2019 1718   Sepsis Labs: @LABRCNTIP (procalcitonin:4,lacticidven:4)  ) Recent Results (from the past 240 hour(s))  SARS Coronavirus 2 by RT PCR (hospital order, performed in [facility] hospital lab) Nasopharyngeal Nasopharyngeal Swab     Status: None   Collection Time: 10/03/19 12:00 AM   Specimen: Nasopharyngeal Swab  Result Value Ref Range Status   SARS Coronavirus 2 NEGATIVE NEGATIVE Final    Comment: (NOTE) SARS-CoV-2 target nucleic acids are NOT DETECTED.  The SARS-CoV-2 RNA is generally detectable in upper and lower respiratory specimens during the acute phase of infection. The  lowest concentration of SARS-CoV-2 viral copies this assay can detect is 250 copies / mL. A negative result does not preclude SARS-CoV-2 infection and should not be used as the sole basis for treatment or other patient management decisions.  A negative result may occur with improper specimen collection / handling, submission of specimen other than nasopharyngeal swab, presence of viral mutation(s) within the areas targeted by this assay, and inadequate number of viral copies (<250 copies / mL). A negative result must be combined with clinical observations, patient history, and epidemiological information.  Fact Sheet for Patients:   BoilerBrush.com.cy  Fact Sheet for Healthcare Providers: https://pope.com/  This test is not yet approved or  cleared by the [place] FDA and has been authorized for detection and/or diagnosis of SARS-CoV-2 by FDA under an Emergency Use Authorization (EUA).  This EUA will remain in effect (meaning this test can be used) for the duration of the COVID-19 declaration under Section 564(b)(1) of the Act, 21 U.S.C. section 360bbb-3(b)(1), unless the authorization is terminated or revoked sooner.  Performed at [facility], [address], [place], [place] [address]        Studies: No results found.  Scheduled Meds: . enoxaparin (LOVENOX) injection  40 mg Subcutaneous QHS  . erythromycin  500 mg Oral TID with meals  . ferrous sulfate  325 mg Oral Q breakfast  . folic acid  1 mg Oral Daily  . metoCLOPramide (REGLAN) injection  10 mg Intravenous Q6H  . metoprolol succinate  50 mg Oral QHS  . pantoprazole (PROTONIX) IV  40 mg Intravenous Q12H  . sertraline  50 mg Oral QHS  . sodium chloride flush  3 mL Intravenous Once    Continuous Infusions: . dextrose 5 % and 0.9 % NaCl with KCl 20 mEq/L 75 mL/hr at 10/06/19 0444  . lactated ringers    . lactated ringers       LOS: 3 days     Darlin Drop, MD [facility] Pager [phone]  If 7PM-7AM, please contact night-coverage [email or web address] Password [facility] 10/06/2019, 4:08 PM

## 2019-10-06 NOTE — Progress Notes (Signed)
Patient's endoscopy was well-tolerated and was basically unremarkable, specifically, no anatomic cause for her protracted nausea and vomiting was seen.  The patient did have some bile reflux, which can be a normal finding but can also go along with gastric dysmotility.  Random biopsies were obtained from the esophagus, stomach, and duodenum.  Recommendations: Trial of frequent small aliquots of clear liquids and, if this is tolerated, transition to oral metoclopramide and perhaps try stopping the patient's erythromycin while advancing the size of the clear liquid boluses over the next day or 2.  Florencia Reasons, M.D. Pager [phone] If no answer or after 5 PM call [phone]

## 2019-10-06 NOTE — Transfer of Care (Signed)
Immediate Anesthesia Transfer of Care Note  Patient: Angela Harrison  Procedure(s) Performed: ESOPHAGOGASTRODUODENOSCOPY (EGD) WITH PROPOFOL (N/A ) BIOPSY  Patient Location: Endoscopy Unit  Anesthesia Type:MAC  Level of Consciousness: drowsy  Airway & Oxygen Therapy: Patient Spontanous Breathing and Patient connected to nasal cannula oxygen  Post-op Assessment: Report given to RN and Post -op Vital signs reviewed and stable  Post vital signs: Reviewed and stable  Last Vitals:  Vitals Value Taken Time  BP    Temp    Pulse    Resp    SpO2      Last Pain:  Vitals:   10/06/19 0953  TempSrc: Oral  PainSc: 6       Patients Stated Pain Goal: 3 (16/10/96 0454)  Complications: No complications documented.

## 2019-10-06 NOTE — Interval H&P Note (Signed)
History and Physical Interval Note:  10/06/2019 10:14 AM  Angela Harrison  has presented today for surgery, with the diagnosis of Nausea/Vomiting; History of Peptic Ulcer Disease.  The nature, purpose, and risks of endoscopy have been discussed with the patient. After consideration of risks, benefits, the patient has consented to  Procedure(s): ESOPHAGOGASTRODUODENOSCOPY (EGD) WITH PROPOFOL (N/A) as a surgical intervention.  The patient's history has been reviewed, patient examined, no change in status, stable for surgery.  I have reviewed the patient's chart and labs.  Questions were answered to the patient's satisfaction.     Katy Fitch Calista Crain

## 2019-10-06 NOTE — Anesthesia Procedure Notes (Signed)
Procedure Name: MAC Performed by: Valda Favia, CRNA Pre-anesthesia Checklist: Patient identified, Emergency Drugs available, Suction available, Patient being monitored and Timeout performed Patient Re-evaluated:Patient Re-evaluated prior to induction Oxygen Delivery Method: Nasal cannula Preoxygenation: Pre-oxygenation with 100% oxygen Induction Type: IV induction Airway Equipment and Method: Bite block Placement Confirmation: positive ETCO2 Dental Injury: Teeth and Oropharynx as per pre-operative assessment

## 2019-10-07 LAB — BASIC METABOLIC PANEL
Anion gap: 9 (ref 5–15)
BUN: <5 mg/dL — ABNORMAL LOW (ref 6–20)
CO2: 28 mmol/L (ref 22–32)
Calcium: 9.2 mg/dL (ref 8.9–10.3)
Chloride: 102 mmol/L (ref 98–111)
Creatinine, Ser: 0.9 mg/dL (ref 0.44–1.00)
GFR calc Af Amer: >60 mL/min (ref >60)
GFR calc non Af Amer: >60 mL/min (ref >60)
Glucose, Bld: 116 mg/dL — ABNORMAL HIGH (ref 70–99)
Potassium: 4 mmol/L (ref 3.5–5.1)
Sodium: 139 mmol/L (ref 135–145)

## 2019-10-07 LAB — SURGICAL PATHOLOGY

## 2019-10-07 MED ORDER — AMLODIPINE BESYLATE 5 MG PO TABS
5.0000 mg | ORAL_TABLET | Freq: Every day | ORAL | Status: DC
  Administered 2019-10-07 – 2019-10-08 (×2): 5 mg via ORAL
  Filled 2019-10-07 (×2): qty 1

## 2019-10-07 NOTE — Progress Notes (Signed)
PROGRESS NOTE  Angela Harrison CBU:[id number] DOB: 1975-11-24 DOA: 10/02/2019 PCP: Farris Has, MD  HPI/Recap of past 24 hours: Angela Montane Ledfordis a 44 y.o.femalewithhistory of Erlers-Danlos syndrome, chronic pain syndrome on chronic opiate use, peptic ulcer disease, gastroparesis, follows up with gastroenterologist at [facility] has been experiencing more than usual nausea and vomiting for the last 1 week. Denies any abdominal pain or diarrhea. Despite taking her antiemetics at home patient was vomiting and was unable to keep anything down.  [facility] [place] consulted and following.   Gastric emptying scan completed on 10/03/2019 showing minimal delay at 3 to 4 hours timeframe.  Patient is on chronic pain medication and is adamant about staying on them which could be contributing to her nausea.  EGD done 10/06/19 Dr. Matthias Hughs essentially unremarkable and showing evidence of dysmotility. Biopsies taken.  Results of biopsy as stated below:  FINAL MICROSCOPIC DIAGNOSIS:   A. DUODENUM, BIOPSY:  - Benign small bowel mucosa.  - No villous blunting or increase in intraepithelial lymphocytes.  - No dysplasia or malignancy.   B. STOMACH, BIOPSY:  - Reactive gastropathy with erosions.  - Warthin-Starry is negative for Helicobacter pylori.  - No intestinal metaplasia, dysplasia, or malignancy.   C. ESOPHAGUS, DISTAL 5 CM ABOVE GEJ, BIOPSY:  - Benign squamous mucosa.  - No increase in intraepithelial eosinophils.  - No intestinal metaplasia, dysplasia, or malignancy.   10/07/19: Seen and examined at her bedside.  She reports no vomiting overnight, continues to have intermittent nausea with no vomiting.  Current management with small sips of bariatric clear liquid diet.  She denies any abdominal pain.  BP not at goal, added Norvasc 5 mg daily, continue home Toprol-XL 50 mg daily.   Assessment/Plan: Principal Problem:   Nausea & vomiting Active Problems:   Ehlers-Danlos syndrome type  III   Intractable nausea and vomiting  Persistent intractable nausea and vomiting possibly related to her chronic use of narcotics post EGD 10/06/19, Dr. Matthias Hughs Patient is on chronic pain medication and is adamant about staying on them.  States she is on it due to Ehlers-Danlos syndrome. Erythromycin added on 10/04/2019 500 mg 3 times daily with meals, ongoing. Continue IV Reglan 10 mg every 6 hours as needed and IV Zofran as needed Continue IV Protonix 40 mg twice daily Continue IV fluid to avoid dehydration Plan per GI  Essential hypertension, uncontrolled Resume home meds Toprol XL 50 mg daily Added Norvasc 5 mg daily Continue Lopressor IV 5 mg 3 times daily PRN Continue to monitor vital signs  Resolved fever, suspect reactive No leukocytosis No evidence of active infective process. Continue to closely monitor  Iron deficiency anemia Iron studies suggestive of iron deficiency Continue ferrous sulfate 325 mg daily  Ehlers-Danlos/chronic pain/chronic opiate use Adamant to continue prior to admission use of narcotic Continue to monitor  Chronic anxiety/depression Stable Continue Zoloft  Resolved Refractory hypokalemia Currently being repleted intravenously along with her IV fluids Serum potassium 3.6> 4.0. Repleted intravenously along with IV fluid  DVT prophylaxis: Lovenox subcu daily Code Status: Full code. Family Communication: Discussed with patient.  Consults called:  GI.     Status is: Inpatient    Dispo:  Patient From: Home  Planned Disposition: Home  Expected discharge date: 10/08/19  Medically stable for discharge: No, unable to tolerate oral intake due to intractable nausea and vomiting.          Objective: Vitals:   10/06/19 1539 10/06/19 2020 10/07/19 0453 10/07/19 0844  BP: Marland Kitchen)  173/110 (!) 165/113 (!) 160/117 (!) 135/105  Pulse: 94 (!) 108 96   Resp:  18 17   Temp:  99.2 F (37.3 C) 98.4 F (36.9 C)   TempSrc:      SpO2:  97% 98%    Weight:      Height:        Intake/Output Summary (Last 24 hours) at 10/07/2019 1346 Last data filed at 10/07/2019 0845 Gross per 24 hour  Intake 300 ml  Output --  Net 300 ml   Filed Weights   10/02/19 1517 10/02/19 1525 10/06/19 0953  Weight: 93.9 kg 98.9 kg 93 kg    Exam: . General: 44 y.o. year-old female well-developed and nourished no acute stress.  Alert oriented x3.   . Cardiovascular: Regular rate and rhythm no rubs or gallops. Marland Kitchen Respiratory: Clear to auscultation no wheezes or rales. . Abdomen: Soft nontender normal bowel sounds present.   . Musculoskeletal: No lower extremity edema bilaterally. Marland Kitchen Psychiatry: Mood is appropriate for condition and setting.  Data Reviewed: CBC: Recent Labs  Lab 09/30/19 2231 10/02/19 1528 10/03/19 0220 10/04/19 0723 10/05/19 0650  WBC 9.7 8.4 12.3* 10.5 8.7  NEUTROABS  --   --   --  7.5  --   HGB 12.6 12.0 11.1* 12.3 11.6*  HCT 39.5 38.3 36.1 38.4 36.8  MCV 77.6* 77.8* 78.6* 77.6* 78.3*  PLT 559* 477* 390 418* 362   Basic Metabolic Panel: Recent Labs  Lab 09/30/19 2231 10/02/19 1528 10/03/19 0220 10/04/19 0723 10/05/19 0650 10/06/19 0158 10/07/19 0308  NA 138   < > 138 137 136 137 139  K 3.6   < > 3.1* 3.6 3.1* 3.6 4.0  CL 100   < > 104 100 101 103 102  CO2 24   < > 23 26 25 25 28   GLUCOSE 173*   < > 114* 132* 121* 105* 116*  BUN 10   < > 8 5* <5* <5* <5*  CREATININE 1.19*   < > 0.92 0.88 0.82 0.86 0.90  CALCIUM 9.4   < > 8.6* 9.0 8.7* 9.0 9.2  MG 2.0  --   --  1.9  --   --   --   PHOS  --   --   --  2.7  --   --   --    < > = values in this interval not displayed.   GFR: Estimated Creatinine Clearance: 99.3 mL/min (by C-G formula based on SCr of 0.9 mg/dL). Liver Function Tests: Recent Labs  Lab 09/30/19 2231 10/02/19 1528  AST 21 19  ALT 19 21  ALKPHOS 84 76  BILITOT 0.6 0.9  PROT 8.2* 7.8  ALBUMIN 4.5 4.2   Recent Labs  Lab 09/30/19 2231 10/02/19 1528  LIPASE 24 24   No results for input(s):  AMMONIA in the last 168 hours. Coagulation Profile: No results for input(s): INR, PROTIME in the last 168 hours. Cardiac Enzymes: No results for input(s): CKTOTAL, CKMB, CKMBINDEX, TROPONINI in the last 168 hours. BNP (last 3 results) No results for input(s): PROBNP in the last 8760 hours. HbA1C: No results for input(s): HGBA1C in the last 72 hours. CBG: No results for input(s): GLUCAP in the last 168 hours. Lipid Profile: No results for input(s): CHOL, HDL, LDLCALC, TRIG, CHOLHDL, LDLDIRECT in the last 72 hours. Thyroid Function Tests: No results for input(s): TSH, T4TOTAL, FREET4, T3FREE, THYROIDAB in the last 72 hours. Anemia Panel: No results for input(s): VITAMINB12, FOLATE, FERRITIN,  TIBC, IRON, RETICCTPCT in the last 72 hours. Urine analysis:    Component Value Date/Time   COLORURINE YELLOW 10/02/2019 1718   APPEARANCEUR HAZY (A) 10/02/2019 1718   LABSPEC 1.024 10/02/2019 1718   PHURINE 5.0 10/02/2019 1718   GLUCOSEU NEGATIVE 10/02/2019 1718   HGBUR MODERATE (A) 10/02/2019 1718   BILIRUBINUR NEGATIVE 10/02/2019 1718   KETONESUR 80 (A) 10/02/2019 1718   PROTEINUR 100 (A) 10/02/2019 1718   UROBILINOGEN 0.2 04/05/2013 2109   NITRITE NEGATIVE 10/02/2019 1718   LEUKOCYTESUR NEGATIVE 10/02/2019 1718   Sepsis Labs: @LABRCNTIP (procalcitonin:4,lacticidven:4)  ) Recent Results (from the past 240 hour(s))  SARS Coronavirus 2 by RT PCR (hospital order, performed in [facility] hospital lab) Nasopharyngeal Nasopharyngeal Swab     Status: None   Collection Time: 10/03/19 12:00 AM   Specimen: Nasopharyngeal Swab  Result Value Ref Range Status   SARS Coronavirus 2 NEGATIVE NEGATIVE Final    Comment: (NOTE) SARS-CoV-2 target nucleic acids are NOT DETECTED.  The SARS-CoV-2 RNA is generally detectable in upper and lower respiratory specimens during the acute phase of infection. The lowest concentration of SARS-CoV-2 viral copies this assay can detect is 250 copies / mL. A  negative result does not preclude SARS-CoV-2 infection and should not be used as the sole basis for treatment or other patient management decisions.  A negative result may occur with improper specimen collection / handling, submission of specimen other than nasopharyngeal swab, presence of viral mutation(s) within the areas targeted by this assay, and inadequate number of viral copies (<250 copies / mL). A negative result must be combined with clinical observations, patient history, and epidemiological information.  Fact Sheet for Patients:   12/04/19  Fact Sheet for Healthcare Providers: BoilerBrush.com.cy  This test is not yet approved or  cleared by the https://pope.com/ FDA and has been authorized for detection and/or diagnosis of SARS-CoV-2 by FDA under an Emergency Use Authorization (EUA).  This EUA will remain in effect (meaning this test can be used) for the duration of the COVID-19 declaration under Section 564(b)(1) of the Act, 21 U.S.C. section 360bbb-3(b)(1), unless the authorization is terminated or revoked sooner.  Performed at [facility], [address], [place], [place]       Studies: No results found.  Scheduled Meds: . amLODipine  5 mg Oral Daily  . enoxaparin (LOVENOX) injection  40 mg Subcutaneous QHS  . erythromycin  500 mg Oral TID with meals  . ferrous sulfate  325 mg Oral Q breakfast  . folic acid  1 mg Oral Daily  . metoCLOPramide (REGLAN) injection  10 mg Intravenous Q6H  . metoprolol succinate  50 mg Oral QHS  . pantoprazole (PROTONIX) IV  40 mg Intravenous Q12H  . sertraline  50 mg Oral QHS  . sodium chloride flush  3 mL Intravenous Once    Continuous Infusions: . dextrose 5 % and 0.9 % NaCl with KCl 20 mEq/L 75 mL/hr at 10/07/19 0714  . lactated ringers    . lactated ringers       LOS: 4 days     10/09/19, MD [facility] Pager [phone]  If  7PM-7AM, please contact night-coverage [email or web address] Password [facility] 10/07/2019, 1:46 PM

## 2019-10-07 NOTE — Progress Notes (Signed)
No nausea.  No vomiting since yesterday's egd.    Bx's pending.  Compliant with, and tolerating, freq small aliquots of clr liq (30 mL every 30' while awake).  PLAN:  1. Increase po bolus size to 60 mL every 30' 2. Await path on endoscopic bx's (I doubt that will change mgt)  Based on pt's improvement, I believe dischg in approx 2 days is realistic, assuming pt continues to make progress.  Florencia Reasons, M.D. Pager [phone] If no answer or after 5 PM call [phone]

## 2019-10-08 ENCOUNTER — Encounter (HOSPITAL_COMMUNITY): Payer: Self-pay | Admitting: Gastroenterology

## 2019-10-08 DIAGNOSIS — Q7962 Hypermobile Ehlers-Danlos syndrome: Secondary | ICD-10-CM

## 2019-10-08 MED ORDER — PANTOPRAZOLE SODIUM 40 MG PO TBEC: 40.0000 mg | DELAYED_RELEASE_TABLET | Freq: Every day | ORAL | 0 refills | Status: AC

## 2019-10-08 MED ORDER — SIMETHICONE 80 MG PO CHEW: 80.0000 mg | CHEWABLE_TABLET | Freq: Four times a day (QID) | ORAL | 0 refills | Status: DC | PRN

## 2019-10-08 MED ORDER — FOLIC ACID 1 MG PO TABS: 1.0000 mg | ORAL_TABLET | Freq: Every day | ORAL | 0 refills | Status: AC

## 2019-10-08 MED ORDER — SIMETHICONE 80 MG PO CHEW
80.0000 mg | CHEWABLE_TABLET | Freq: Four times a day (QID) | ORAL | Status: DC | PRN
  Administered 2019-10-08: 80 mg via ORAL
  Filled 2019-10-08: qty 1

## 2019-10-08 MED ORDER — FERROUS SULFATE 325 (65 FE) MG PO TABS: 325.0000 mg | ORAL_TABLET | Freq: Every day | ORAL | 0 refills | Status: AC

## 2019-10-08 MED ORDER — ERYTHROMYCIN BASE 500 MG PO TABS: 500.0000 mg | ORAL_TABLET | Freq: Three times a day (TID) | ORAL | 0 refills | Status: DC

## 2019-10-08 MED ORDER — AMLODIPINE BESYLATE 5 MG PO TABS: 5.0000 mg | ORAL_TABLET | Freq: Every day | ORAL | 0 refills | Status: AC

## 2019-10-08 NOTE — Progress Notes (Signed)
Patient has tolerated the increased volume of her frequent small aliquots of clear liquids, now 60 mL every 30 minutes while awake, without any nausea or vomiting.  She feels ready to advance to solid food.  She is sitting up in the chair and appears to be in very good spirits.  Recommendations:  1.  I have ordered a soft diet for the patient  2.  The patient and her husband, who is at the bedside, were refreshed on strategies for advancement of diet, beginning with low residue foods and frequent small meals.  If the patient tolerates a small lunch today as well as some afternoon snacks, and if she feels ready to go home, I would have no objection to discharge late this afternoon.  The patient knows not to introduce fibrous vegetables into her diet until she is tolerating low residue food ad lib. without symptoms.  3.  The patient and her husband were instructed on outpatient management strategies for periodic gastric dysmotility episodes.  Specifically, resume frequent small aliquots of clear liquids and advance gradually as we did here in the hospital.  4.  I gave the patient my card and she is welcome to follow-up with me in the future as needed.  However, specific GI office follow-up is not necessary at this time.  5.  Based on updated guidelines, since the patient is age 80, she is now a candidate for colon cancer screening.  She indicates that she has 2 grandparents who had colon cancer, at least 1 of which was at an advanced age but she is not sure about the other.  I suggested that she discuss options for colon cancer screening with her primary physician, who could then refer her back to me if a decision is made for colonoscopy as opposed to stool studies.  Her Ehler-Dandlos syndrome is not a contraindication to colonoscopic evaluation.  Case discussed with Dr.  Hanley Ben.  Please call me if questions.  Florencia Reasons, M.D. Pager [phone] If no answer or after 5 PM call  [phone]

## 2019-10-08 NOTE — Discharge Summary (Signed)
Physician Discharge Summary  Angela BrilliantJennifer F Harrison ZOX:[id number] DOB: 06/18/1975 DOA: 10/02/2019  PCP: Farris HasMorrow, Aaron, MD  Admit date: 10/02/2019 Discharge date: 10/08/2019  Admitted From: Home Disposition: Home  Recommendations for Outpatient Follow-up:  1. Follow up with PCP in 1 week with repeat CBC/BMP 2. Outpatient follow-up with GI 3. Follow up in ED if symptoms worsen or new appear   Home Health: No Equipment/Devices: None  Discharge Condition: Stable CODE STATUS: Full Diet recommendation: Soft.  Low residue foods and frequent small meals as per GI recommendations  Brief/Interim Summary: 44 year old female with history of Ehlers-Danlos syndrome, chronic pain syndrome on chronic opiate use, peptic ulcer disease, gastroparesis, [facility] with worsening nausea and vomiting.  GI was consulted.  Gastric emptying scan on 10/03/2019 showed minimal delay at 3 to 4 hours timeframe.  Patient is adamant about continuing her regular home opiate medications.  She underwent EGD on 10/06/2019.  Subsequently diet is being advanced by GI today.  If she tolerates diet today, she will be discharged home.  Discharge Diagnoses:   Persistent intractable nausea and vomiting possibly related to her chronic use of narcotics -Patient is on chronic pain medication and is adamant about staying on them.  She apparently uses pain medications for her Ehlers-Danlos syndrome -Gastric emptying scan on 10/03/2019 showed minimal delay at 3 to 4 hours timeframe.  -Status post EGD on 10/06/2019 by GI and pathology was negative for H. pylori or malignancy -GI following: Tolerating diet currently.  GI is advancing diet today.  If she tolerates advance diet today, she will be discharged home.  Continue erythromycin.  Continue as needed Zofran/Reglan.  Outpatient follow-up with GI.    Essential hypertension -Pressure improving.  Continue Toprol.  Norvasc has been started during  the hospitalization which were continued  Resolved fever, suspect reactive -No leukocytosis or evidence of active infective process.  Monitored off antibiotics  Iron deficiency anemia -Iron studies suggestive of iron deficiency.  Continue ferrous sulfate 325 mg daily.  Ehlers-Danlos syndrome/chronic pain/chronic opiate use -Adamant to continue prior to admission use of narcotic.  Outpatient follow-up with PCP  Chronic anxiety/depression -Continue Zoloft    Discharge Instructions  Discharge Instructions    Diet general   Complete by: As directed    Soft diet   Increase activity slowly   Complete by: As directed      Allergies as of 10/08/2019      Reactions   Cephalosporins Hives, Swelling, Rash, Other (See Comments)   Can tolerate Penicillin   Cefazolin Hives, Swelling, Rash, Other (See Comments)   Can tolerate Penicillin   Cephalexin Hives, Swelling, Rash, Other (See Comments)   Can tolerate Penicillin      Medication List    STOP taking these medications   aspirin 81 MG chewable tablet   docusate sodium 100 MG capsule Commonly known as: COLACE   enoxaparin 40 MG/0.4ML injection Commonly known as: LOVENOX   methocarbamol 750 MG tablet Commonly known as: ROBAXIN   polyethylene glycol 17 g packet Commonly known as: MIRALAX / GLYCOLAX     TAKE these medications   amLODipine 5 MG tablet Commonly known as: NORVASC Take 1 tablet (5 mg total) by mouth daily. Start taking on: October 09, 2019   Emgality 120 MG/ML Soaj Generic drug: Galcanezumab-gnlm 120 mg every 30 (thirty) days.   erythromycin base 500 MG tablet Commonly known as: E-MYCIN Take 1 tablet (500 mg total) by mouth with breakfast, with lunch, and  with evening meal.   ferrous sulfate 325 (65 FE) MG tablet Take 1 tablet (325 mg total) by mouth daily with breakfast. Start taking on: October 09, 2019   folic acid 1 MG tablet Commonly known as: FOLVITE Take 1 tablet (1 mg total) by mouth  daily. Start taking on: October 09, 2019   HYDROcodone-acetaminophen 10-325 MG tablet Commonly known as: NORCO Take 1 tablet by mouth every 6 (six) hours as needed for moderate pain. What changed: Another medication with the same name was removed. Continue taking this medication, and follow the directions you see here.   Imitrex 6 MG/0.5ML Soln injection Generic drug: SUMAtriptan Inject 6 mg into the skin every 2 (two) hours as needed for migraine or headache. May repeat in 2 hours if headache persists or recurs.   metoCLOPramide 10 MG tablet Commonly known as: REGLAN Take 1 tablet (10 mg total) by mouth every 8 (eight) hours as needed for nausea.   metoprolol succinate 50 MG 24 hr tablet Commonly known as: TOPROL-XL Take 50 mg by mouth at bedtime.   ondansetron 4 MG disintegrating tablet Commonly known as: Zofran ODT Take 1 tablet (4 mg total) by mouth every 6 (six) hours as needed for nausea or vomiting.   pantoprazole 40 MG tablet Commonly known as: Protonix Take 1 tablet (40 mg total) by mouth daily.   promethazine 25 MG suppository Commonly known as: PHENERGAN Place 25 mg rectally every 6 (six) hours as needed for nausea or vomiting.   sertraline 50 MG tablet Commonly known as: ZOLOFT Take 50 mg by mouth at bedtime.   simethicone 80 MG chewable tablet Commonly known as: MYLICON Chew 1 tablet (80 mg total) by mouth 4 (four) times daily as needed for flatulence.   tiZANidine 4 MG tablet Commonly known as: ZANAFLEX Take 4 mg by mouth every 6 (six) hours as needed for muscle spasms.       Follow-up Information    Farris Has, MD. Schedule an appointment as soon as possible for a visit in 1 week(s).   Specialty: Family Medicine Why: with repeat cbc/bmp Contact information: [address] [place] [address] [phone]        Bernette Redbird, MD. Schedule an appointment as soon as possible for a visit in 1 week(s).   Specialty:  Gastroenterology Contact information: [address] [place] [address] [phone]              Allergies  Allergen Reactions  . Cephalosporins Hives, Swelling, Rash and Other (See Comments)    Can tolerate Penicillin  . Cefazolin Hives, Swelling, Rash and Other (See Comments)    Can tolerate Penicillin   . Cephalexin Hives, Swelling, Rash and Other (See Comments)    Can tolerate Penicillin     Consultations:  GI   Procedures/Studies: NM GASTRIC EMPTYING  Result Date: 10/03/2019 CLINICAL DATA:  Gastroparesis.  Nausea. EXAM: NUCLEAR MEDICINE GASTRIC EMPTYING SCAN TECHNIQUE: After oral ingestion of radiolabeled meal, sequential abdominal images were obtained for 4 hours. Percentage of activity emptying the stomach was calculated at 1 hour, 2 hour, 3 hour, and 4 hours. RADIOPHARMACEUTICALS:  2.0 mCi Tc-17m sulfur colloid in standardized meal COMPARISON:  None. FINDINGS: Expected location of the stomach in the left upper quadrant. Ingested meal empties the stomach gradually over the course of the study. 32.0% emptied at 1 hr ( normal >= 10%) 51.0% emptied at 2 hr ( normal >= 40%) 69.0% emptied at 3 hr (  normal >= 70%) 88.0% emptied at 4 hr ( normal >= 90%) IMPRESSION: Mildly delayed gastric emptying study at 3 and 4 hours Electronically Signed   By: Gerome Sam III M.D   On: 10/03/2019 17:25       Subjective: Patient seen and examined at bedside.  She feels better and thinks that she will be able to go home today.  Tolerating diet.  Denies any current vomiting.  No overnight fever, shortness of breath, diarrhea.  Discharge Exam: Vitals:   10/08/19 0620 10/08/19 1421  BP: (!) 141/98 117/85  Pulse: 93 (!) 102  Resp:  18  Temp: 97.8 F (36.6 C) 97.6 F (36.4 C)  SpO2: 97% 97%    General: Pt is alert, awake, not in acute distress Cardiovascular: Intermittently tachycardic, S1/S2 + Respiratory: bilateral decreased breath sounds at bases Abdominal:  Soft, NT, ND, bowel sounds + Extremities: no edema, no cyanosis    The results of significant diagnostics from this hospitalization (including imaging, microbiology, ancillary and laboratory) are listed below for reference.     Microbiology: Recent Results (from the past 240 hour(s))  SARS Coronavirus 2 by RT PCR (hospital order, performed in [facility] hospital lab) Nasopharyngeal Nasopharyngeal Swab     Status: None   Collection Time: 10/03/19 12:00 AM   Specimen: Nasopharyngeal Swab  Result Value Ref Range Status   SARS Coronavirus 2 NEGATIVE NEGATIVE Final    Comment: (NOTE) SARS-CoV-2 target nucleic acids are NOT DETECTED.  The SARS-CoV-2 RNA is generally detectable in upper and lower respiratory specimens during the acute phase of infection. The lowest concentration of SARS-CoV-2 viral copies this assay can detect is 250 copies / mL. A negative result does not preclude SARS-CoV-2 infection and should not be used as the sole basis for treatment or other patient management decisions.  A negative result may occur with improper specimen collection / handling, submission of specimen other than nasopharyngeal swab, presence of viral mutation(s) within the areas targeted by this assay, and inadequate number of viral copies (<250 copies / mL). A negative result must be combined with clinical observations, patient history, and epidemiological information.  Fact Sheet for Patients:   BoilerBrush.com.cy  Fact Sheet for Healthcare Providers: https://pope.com/  This test is not yet approved or  cleared by the [place] FDA and has been authorized for detection and/or diagnosis of SARS-CoV-2 by FDA under an Emergency Use Authorization (EUA).  This EUA will remain in effect (meaning this test can be used) for the duration of the COVID-19 declaration under Section 564(b)(1) of the Act, 21 U.S.C. section 360bbb-3(b)(1), unless the  authorization is terminated or revoked sooner.  Performed at [facility], [address], [place], [place] [address]      Labs: BNP (last 3 results) No results for input(s): BNP in the last 8760 hours. Basic Metabolic Panel: Recent Labs  Lab 10/03/19 0220 10/04/19 0723 10/05/19 0650 10/06/19 0158 10/07/19 0308  NA 138 137 136 137 139  K 3.1* 3.6 3.1* 3.6 4.0  CL 104 100 101 103 102  CO2 23 26 25 25 28   GLUCOSE 114* 132* 121* 105* 116*  BUN 8 5* <5* <5* <5*  CREATININE 0.92 0.88 0.82 0.86 0.90  CALCIUM 8.6* 9.0 8.7* 9.0 9.2  MG  --  1.9  --   --   --   PHOS  --  2.7  --   --   --    Liver Function Tests: Recent Labs  Lab 10/02/19  1528  AST 19  ALT 21  ALKPHOS 76  BILITOT 0.9  PROT 7.8  ALBUMIN 4.2   Recent Labs  Lab 10/02/19 1528  LIPASE 24   No results for input(s): AMMONIA in the last 168 hours. CBC: Recent Labs  Lab 10/02/19 1528 10/03/19 0220 10/04/19 0723 10/05/19 0650  WBC 8.4 12.3* 10.5 8.7  NEUTROABS  --   --  7.5  --   HGB 12.0 11.1* 12.3 11.6*  HCT 38.3 36.1 38.4 36.8  MCV 77.8* 78.6* 77.6* 78.3*  PLT 477* 390 418* 362   Cardiac Enzymes: No results for input(s): CKTOTAL, CKMB, CKMBINDEX, TROPONINI in the last 168 hours. BNP: Invalid input(s): POCBNP CBG: No results for input(s): GLUCAP in the last 168 hours. D-Dimer No results for input(s): DDIMER in the last 72 hours. Hgb A1c No results for input(s): HGBA1C in the last 72 hours. Lipid Profile No results for input(s): CHOL, HDL, LDLCALC, TRIG, CHOLHDL, LDLDIRECT in the last 72 hours. Thyroid function studies No results for input(s): TSH, T4TOTAL, T3FREE, THYROIDAB in the last 72 hours.  Invalid input(s): FREET3 Anemia work up No results for input(s): VITAMINB12, FOLATE, FERRITIN, TIBC, IRON, RETICCTPCT in the last 72 hours. Urinalysis    Component Value Date/Time   COLORURINE YELLOW 10/02/2019 1718   APPEARANCEUR HAZY (A) 10/02/2019 1718   LABSPEC 1.024 10/02/2019  1718   PHURINE 5.0 10/02/2019 1718   GLUCOSEU NEGATIVE 10/02/2019 1718   HGBUR MODERATE (A) 10/02/2019 1718   BILIRUBINUR NEGATIVE 10/02/2019 1718   KETONESUR 80 (A) 10/02/2019 1718   PROTEINUR 100 (A) 10/02/2019 1718   UROBILINOGEN 0.2 04/05/2013 2109   NITRITE NEGATIVE 10/02/2019 1718   LEUKOCYTESUR NEGATIVE 10/02/2019 1718   Sepsis Labs Invalid input(s): PROCALCITONIN,  WBC,  LACTICIDVEN Microbiology Recent Results (from the past 240 hour(s))  SARS Coronavirus 2 by RT PCR (hospital order, performed in [facility] hospital lab) Nasopharyngeal Nasopharyngeal Swab     Status: None   Collection Time: 10/03/19 12:00 AM   Specimen: Nasopharyngeal Swab  Result Value Ref Range Status   SARS Coronavirus 2 NEGATIVE NEGATIVE Final    Comment: (NOTE) SARS-CoV-2 target nucleic acids are NOT DETECTED.  The SARS-CoV-2 RNA is generally detectable in upper and lower respiratory specimens during the acute phase of infection. The lowest concentration of SARS-CoV-2 viral copies this assay can detect is 250 copies / mL. A negative result does not preclude SARS-CoV-2 infection and should not be used as the sole basis for treatment or other patient management decisions.  A negative result may occur with improper specimen collection / handling, submission of specimen other than nasopharyngeal swab, presence of viral mutation(s) within the areas targeted by this assay, and inadequate number of viral copies (<250 copies / mL). A negative result must be combined with clinical observations, patient history, and epidemiological information.  Fact Sheet for Patients:   BoilerBrush.com.cy  Fact Sheet for Healthcare Providers: https://pope.com/  This test is not yet approved or  cleared by the [place] FDA and has been authorized for detection and/or diagnosis of SARS-CoV-2 by FDA under an Emergency Use Authorization (EUA).  This EUA will remain in  effect (meaning this test can be used) for the duration of the COVID-19 declaration under Section 564(b)(1) of the Act, 21 U.S.C. section 360bbb-3(b)(1), unless the authorization is terminated or revoked sooner.  Performed at [facility], [address] [place], [place] [address]      Time coordinating discharge: 35 minutes  SIGNED:  Glade Lloyd, MD  [facility] 10/08/2019, 2:37 PM

## 2019-10-16 DIAGNOSIS — I10 Essential (primary) hypertension: Secondary | ICD-10-CM | POA: Diagnosis not present

## 2019-10-16 DIAGNOSIS — R112 Nausea with vomiting, unspecified: Secondary | ICD-10-CM | POA: Diagnosis not present

## 2019-10-16 DIAGNOSIS — Q796 Ehlers-Danlos syndrome, unspecified: Secondary | ICD-10-CM | POA: Diagnosis not present

## 2019-11-12 DIAGNOSIS — Z20828 Contact with and (suspected) exposure to other viral communicable diseases: Secondary | ICD-10-CM | POA: Diagnosis not present

## 2019-12-02 DIAGNOSIS — Z1231 Encounter for screening mammogram for malignant neoplasm of breast: Secondary | ICD-10-CM | POA: Diagnosis not present

## 2019-12-05 DIAGNOSIS — G894 Chronic pain syndrome: Secondary | ICD-10-CM | POA: Diagnosis not present

## 2019-12-09 DIAGNOSIS — M25552 Pain in left hip: Secondary | ICD-10-CM | POA: Diagnosis not present

## 2019-12-09 DIAGNOSIS — M25551 Pain in right hip: Secondary | ICD-10-CM | POA: Diagnosis not present

## 2019-12-11 DIAGNOSIS — M25552 Pain in left hip: Secondary | ICD-10-CM | POA: Diagnosis not present

## 2019-12-11 DIAGNOSIS — M25551 Pain in right hip: Secondary | ICD-10-CM | POA: Diagnosis not present

## 2019-12-16 DIAGNOSIS — M25551 Pain in right hip: Secondary | ICD-10-CM | POA: Diagnosis not present

## 2019-12-16 DIAGNOSIS — M25552 Pain in left hip: Secondary | ICD-10-CM | POA: Diagnosis not present

## 2019-12-18 DIAGNOSIS — M25552 Pain in left hip: Secondary | ICD-10-CM | POA: Diagnosis not present

## 2019-12-18 DIAGNOSIS — M25551 Pain in right hip: Secondary | ICD-10-CM | POA: Diagnosis not present

## 2019-12-23 DIAGNOSIS — M25551 Pain in right hip: Secondary | ICD-10-CM | POA: Diagnosis not present

## 2019-12-23 DIAGNOSIS — M25552 Pain in left hip: Secondary | ICD-10-CM | POA: Diagnosis not present

## 2019-12-25 DIAGNOSIS — M25552 Pain in left hip: Secondary | ICD-10-CM | POA: Diagnosis not present

## 2019-12-25 DIAGNOSIS — M25551 Pain in right hip: Secondary | ICD-10-CM | POA: Diagnosis not present

## 2019-12-30 DIAGNOSIS — M25552 Pain in left hip: Secondary | ICD-10-CM | POA: Diagnosis not present

## 2019-12-30 DIAGNOSIS — M25551 Pain in right hip: Secondary | ICD-10-CM | POA: Diagnosis not present

## 2020-01-06 DIAGNOSIS — K3189 Other diseases of stomach and duodenum: Secondary | ICD-10-CM | POA: Diagnosis not present

## 2020-01-06 DIAGNOSIS — R1115 Cyclical vomiting syndrome unrelated to migraine: Secondary | ICD-10-CM | POA: Diagnosis not present

## 2020-01-06 DIAGNOSIS — Z1211 Encounter for screening for malignant neoplasm of colon: Secondary | ICD-10-CM | POA: Diagnosis not present

## 2020-01-06 DIAGNOSIS — R635 Abnormal weight gain: Secondary | ICD-10-CM | POA: Diagnosis not present

## 2020-01-22 DIAGNOSIS — M25562 Pain in left knee: Secondary | ICD-10-CM | POA: Diagnosis not present

## 2020-01-23 DIAGNOSIS — M25552 Pain in left hip: Secondary | ICD-10-CM | POA: Diagnosis not present

## 2020-01-23 DIAGNOSIS — M25551 Pain in right hip: Secondary | ICD-10-CM | POA: Diagnosis not present

## 2020-01-23 DIAGNOSIS — Z03818 Encounter for observation for suspected exposure to other biological agents ruled out: Secondary | ICD-10-CM | POA: Diagnosis not present

## 2020-01-29 DIAGNOSIS — M25551 Pain in right hip: Secondary | ICD-10-CM | POA: Diagnosis not present

## 2020-01-29 DIAGNOSIS — M25552 Pain in left hip: Secondary | ICD-10-CM | POA: Diagnosis not present

## 2020-02-10 DIAGNOSIS — M25562 Pain in left knee: Secondary | ICD-10-CM | POA: Diagnosis not present

## 2020-02-24 DIAGNOSIS — M76892 Other specified enthesopathies of left lower limb, excluding foot: Secondary | ICD-10-CM | POA: Diagnosis not present

## 2020-02-24 DIAGNOSIS — M25562 Pain in left knee: Secondary | ICD-10-CM | POA: Diagnosis not present

## 2020-02-25 DIAGNOSIS — Z1159 Encounter for screening for other viral diseases: Secondary | ICD-10-CM | POA: Diagnosis not present

## 2020-03-01 DIAGNOSIS — Z8371 Family history of colonic polyps: Secondary | ICD-10-CM | POA: Diagnosis not present

## 2020-03-01 DIAGNOSIS — Z800 Family history of malignant neoplasm of digestive organs: Secondary | ICD-10-CM | POA: Diagnosis not present

## 2020-03-01 DIAGNOSIS — Z1211 Encounter for screening for malignant neoplasm of colon: Secondary | ICD-10-CM | POA: Diagnosis not present

## 2020-03-03 DIAGNOSIS — M25552 Pain in left hip: Secondary | ICD-10-CM | POA: Diagnosis not present

## 2020-03-03 DIAGNOSIS — M25551 Pain in right hip: Secondary | ICD-10-CM | POA: Diagnosis not present

## 2020-03-08 DIAGNOSIS — G894 Chronic pain syndrome: Secondary | ICD-10-CM | POA: Diagnosis not present

## 2020-03-10 DIAGNOSIS — M25552 Pain in left hip: Secondary | ICD-10-CM | POA: Diagnosis not present

## 2020-03-10 DIAGNOSIS — M25551 Pain in right hip: Secondary | ICD-10-CM | POA: Diagnosis not present

## 2020-03-30 DIAGNOSIS — Z713 Dietary counseling and surveillance: Secondary | ICD-10-CM | POA: Diagnosis not present

## 2020-04-05 DIAGNOSIS — Z03818 Encounter for observation for suspected exposure to other biological agents ruled out: Secondary | ICD-10-CM | POA: Diagnosis not present

## 2020-04-20 DIAGNOSIS — M25562 Pain in left knee: Secondary | ICD-10-CM | POA: Diagnosis not present

## 2020-04-20 DIAGNOSIS — M76892 Other specified enthesopathies of left lower limb, excluding foot: Secondary | ICD-10-CM | POA: Diagnosis not present

## 2020-04-27 DIAGNOSIS — Z713 Dietary counseling and surveillance: Secondary | ICD-10-CM | POA: Diagnosis not present

## 2020-05-24 NOTE — Progress Notes (Signed)
New Patient Note  RE: Angela Harrison MRN: [id number] DOB: [birth year] Date of Office Visit: 05/25/2020  Referring provider: Farris Has, MD Primary care provider: Farris Has, MD  Chief Complaint: Allergic Reaction  History of Present Illness: I had the pleasure of seeing Angela Harrison for initial evaluation at the [facility] on 05/26/2020. She is a 45 y.o. 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  Patient was diagnosed with [name] danlos syndrome in 2017. She also has gastroparesis, POTS like symptoms with hypotensions and tachycardia, and chronic pain.  Patient concerned if she has mast cell activation syndrome given her other constellations of symptoms which include:  Flushing/breaking out when overheated  Pruritus and urticaria at times.  Joint swelling but that may be due to her [name]   Nasal congestion and rhinorrhea at times.  Migraines and headaches  Reflux, nausea/vomiting, diarrhea, abdominal cramping is on daily Protonix.  Patient denies any respiratory symptoms or prior diagnosis of asthma/copd however has used inhalers during URIs.  Patient currently follows with cardiology at [facility], GI at [facility], ortho at [facility]. Used to follow with neurology for migraines.   Patient is not aware of having a prior tryptase level checked.   No prior covid-19 diagnosis.  Assessment and Plan: Ryenne is a 45 y.o. female with: Flushing Patient present with a constellation of symptoms including flushing, rash, pruritus, joint swelling, nasal congestion, rhinorrhea, migraines/headaches, reflux, nausea/vomiting, diarrhea, abdominal cramping for many years. 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 She is concerned about mast cell activation  syndrome - no prior tryptase level drawn or anaphylaxis.   Discussed with patient that mast call disorders are rare and she has other diagnoses that may be causing her symptoms.   Will get some basic bloodwork and urine tests as below.   Start Zyrtec (ceterizine) 10mg  daily at night.  Start Pepcid (famotidine) 20mg  twice a day.  See if they help her symptoms.   Also provided patient with a website where she can find physicians that specifically deal with mast cell disorders - the closest one is in .   Return in about 2 months (around 07/25/2020).  Meds ordered this encounter  Medications  . famotidine (PEPCID) 20 MG tablet    Sig: Take 1 tablet (20 mg total) by mouth 2 (two) times daily.    Dispense:  60 tablet    Refill:  5    Lab Orders     CBC with Differential/Platelet     Chronic Urticaria     Comprehensive metabolic panel     Tryptase     Thyroid Cascade Profile     Sedimentation rate     C-reactive protein     ANA w/Reflex     C3 and C4     Other/Misc lab test     Other/Misc lab test     5 HIAA, quantitative, Urine, 24 hour     Metanephrines, Urine, 24 hour     Catecholamines, fractionated, Urine, 24 hour  Other allergy screening: Asthma: no  Patient has used some inhalers in the past during URIs. Rhino conjunctivitis:  Some rhinitis symptoms in the spring and fall.  Food allergy: no Medication allergy: yes Hymenoptera allergy: no Eczema:no History of recurrent infections suggestive of immunodeficency: no  Diagnostics:  None  Past Medical History: Patient Active Problem List   Diagnosis Date Noted  . Flushing 05/26/2020  . Pruritus 05/26/2020  . Swelling of joint 05/26/2020  . Nausea & vomiting 10/03/2019  . Intractable nausea and vomiting 10/03/2019  . Status post hardware removal 12/25/2017  . Closed fracture of left fibula and tibia 12/12/2017  . 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 Status post total replacement of right hip 09/21/2017  . Vitamin D deficiency 12/16/2016  . Ehlers-Danlos syndrome type III   . Arthritis   . Migraine   . 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 Closed left tibial fracture 04/11/2016  . Fracture tibia/fibula, left, closed, initial encounter 04/11/2016  . Obese 08/25/2015  . S/P left THA, AA 08/24/2015  . PANIC DISORDER WITHOUT AGORAPHOBIA 09/24/2007  . MIGRAINE HEADACHE 09/24/2007  . 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 DYSPNEA 09/24/2007   Past Medical History:  Diagnosis Date  . Anemia    PMH  . Arthritis    oa  . 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 Ehlers-Danlos syndrome type III   . Gastric paresis   . Migraine    migraines  . Syncope    neurocardiogenic takes toprol and zoloft for  . Vitamin D deficiency 12/16/2016   Past Surgical History: Past Surgical History:  Procedure Laterality Date  . BIOPSY  10/06/2019   Procedure: BIOPSY;  Surgeon: Bernette Redbird, MD;  Location: [facility] ENDOSCOPY;  Service: Endoscopy;;  . CHOLECYSTECTOMY    . ESOPHAGOGASTRODUODENOSCOPY (EGD) WITH PROPOFOL N/A 10/06/2019   Procedure: ESOPHAGOGASTRODUODENOSCOPY (EGD) WITH PROPOFOL;  Surgeon: Bernette Redbird, MD;  Location: [facility] ENDOSCOPY;  Service: Endoscopy;  Laterality: N/A;  . ESOPHAGOGASTRODUODENOSCOPY ENDOSCOPY     x 2 or 3  . FIBULA FRACTURE SURGERY Left 12/14/2016  . HARDWARE REMOVAL Left 12/14/2016   Procedure: HARDWARE REMOVAL LEFT TIBIA;  Surgeon: Myrene Galas, MD;  Location: [facility] OR;  Service: Orthopedics;  Laterality: Left;  . IM NAILING TIBIA Left 12/14/2016  . KNEE SURGERY Bilateral    2 on each knee  . MAXILLARY ANTROSTOMY    . ORIF ANKLE FRACTURE Left 04/12/2016   Procedure: OPEN REDUCTION INTERNAL FIXATION (ORIF) ANKLE LEFT;  Surgeon: Yolonda Kida, MD;  Location: [facility] OR;  Service: Orthopedics;   Laterality: Left;  . ORIF FIBULA FRACTURE Left 12/14/2016   Procedure: NON-UNION REPAIR LEFT FIBULA FRACTURE;  Surgeon: Myrene Galas, MD;  Location: [facility] OR;  Service: Orthopedics;  Laterality: Left;  . SHOULDER SURGERY Left    x 4  . TIBIA HARDWARE REMOVAL Left 12/14/2016  . TIBIA IM NAIL INSERTION Left 04/12/2016   Procedure: INTRAMEDULLARY (IM) NAIL TIBIAL LEFT;  Surgeon: Yolonda Kida, MD;  Location: [facility] OR;  Service: Orthopedics;  Laterality: Left;  . TIBIA IM NAIL INSERTION Left 12/14/2016   Procedure: INTRAMEDULLARY (IM) NAIL LEFT TIBIAL;  Surgeon: Myrene Galas, MD;  Location: [facility] OR;  Service: Orthopedics;  Laterality: Left;  . TONSILLECTOMY     adenoids also  . TOTAL HIP ARTHROPLASTY Left 08/24/2015   Procedure: LEFT TOTAL HIP ARTHROPLASTY ANTERIOR APPROACH;  Surgeon: Durene Romans, MD;  Location: [facility] ORS;  Service: Orthopedics;  Laterality: Left;  . TOTAL HIP ARTHROPLASTY Right 09/21/2017   Procedure: RIGHT TOTAL HIP ARTHROPLASTY ANTERIOR APPROACH;  Surgeon: Kathryne Hitch, MD;  Location: [facility] ORS;  Service: Orthopedics;  Laterality: Right;   Medication List:  Current Outpatient Medications  Medication Sig Dispense Refill  . amLODipine (NORVASC) 5 MG tablet Take 1 tablet (5 mg total) by mouth daily. 30 tablet 0  . famotidine (PEPCID) 20 MG tablet Take 1 tablet (20 mg total) by mouth 2 (two) times daily. 60 tablet 5  . ferrous sulfate 325 (65 FE) MG tablet Take 1 tablet (325 mg total) by mouth daily with breakfast. 30 tablet 0  . folic acid (FOLVITE) 1 MG tablet Take 1 tablet (1 mg total) by mouth daily. 30 tablet 0  . Galcanezumab-gnlm (EMGALITY) 120 MG/ML SOAJ 120 mg every 30 (thirty) days.    Marland Kitchen HYDROcodone-acetaminophen (NORCO) 10-325 MG tablet Take 1 tablet by mouth every 6 (six) hours as needed for moderate pain.    . Ibuprofen 200 MG CAPS     . metoprolol succinate (TOPROL-XL) 50 MG 24 hr tablet Take 50 mg by mouth at bedtime.   11  . ondansetron (ZOFRAN ODT) 4 MG  disintegrating tablet Take 1 tablet (4 mg total) by mouth every 6 (six) hours as needed for nausea or vomiting. 20 tablet 0  . promethazine (PHENERGAN) 25 MG suppository Place 25 mg rectally every 6 (six) hours as needed for nausea or vomiting.     . sertraline (ZOLOFT) 50 MG tablet Take 50 mg by mouth at bedtime.   11  . simethicone (MYLICON) 80 MG chewable tablet Chew 1 tablet (80 mg total) by mouth 4 (four) times daily as needed for flatulence. 30 tablet 0  . SUMAtriptan (IMITREX) 6 MG/0.5ML SOLN injection Inject 6 mg into the skin every 2 (two) hours as needed for migraine or headache. May repeat in 2 hours if headache persists or recurs.    Marland Kitchen tiZANidine (ZANAFLEX) 4 MG tablet Take 4 mg by mouth every 6 (six) hours as needed for muscle spasms.    . pantoprazole (PROTONIX) 40 MG tablet Take 1 tablet (40 mg total) by mouth daily. 30 tablet 0   No current facility-administered medications for this visit.   Allergies: Allergies  Allergen Reactions  . Cephalosporins Hives, Swelling, Rash and Other (See Comments)    Can tolerate Penicillin  . Cefazolin Hives, Swelling, Rash and Other (See Comments)    Can tolerate Penicillin   . Cephalexin Hives, Swelling, Rash and Other (See Comments)    Can tolerate Penicillin    Social History: Social History   Socioeconomic History  . Marital status: Married    Spouse name: Not on file  . Number of children: Not on file  . Years of education: Not on file  . Highest education level: Not on file  Occupational History  . Not on file  Tobacco Use  . Smoking status: Never Smoker  . Smokeless tobacco: Never Used  Vaping Use  . Vaping Use: Never used  Substance and Sexual Activity  . Alcohol use: Yes    Comment: occ  . Drug use: No  . Sexual activity: Not on file  Other Topics Concern  . Not on file  Social History Narrative  . Not on file   Social Determinants of Health   Financial Resource Strain: Not on file  Food Insecurity: Not on  file  Transportation Needs: Not on file  Physical Activity: Not on file  Stress: Not on file  Social Connections: Not on file   Lives in a house built in [place]. Smoking: denies Occupation: Chief Strategy Officer HistorySurveyor, minerals in the house: yes Engineer, civil (consulting)  in the family room: no Carpet in the bedroom: no Heating: gas Cooling: central Pet: yes 1 dog x 2 yrs  Family History: Family History  Problem Relation Age of Onset  . Breast cancer Mother   . Migraines Mother   . Hypertension Father   . Migraines Father   . Migraines Brother   . Migraines Other    Problem                               Relation Asthma                                   No  Eczema                                No Food allergy                          No  Allergic rhino conjunctivitis     No   Review of Systems  Constitutional: Negative for appetite change, chills, fever and unexpected weight change.  HENT: Negative for congestion and rhinorrhea.   Eyes: Positive for itching.  Respiratory: Negative for cough, chest tightness, shortness of breath and wheezing.   Cardiovascular: Negative for chest pain.  Gastrointestinal: Positive for abdominal pain, diarrhea, nausea and vomiting.  Genitourinary: Negative for difficulty urinating.  Skin: Negative for rash.  Neurological: Positive for dizziness, light-headedness and headaches.   Objective: BP 136/90 (BP Location: Left Arm, Patient Position: Sitting, Cuff Size: Normal)   Pulse 71   Temp (!) 96.8 F (36 C) (Temporal)   Resp 17   Ht 5' 10.28" (1.785 m)   Wt 228 lb (103.4 kg)   SpO2 99%   BMI 32.46 kg/m  Body mass index is 32.46 kg/m. Physical Exam Vitals and nursing note reviewed.  Constitutional:      Appearance: Normal appearance. She is well-developed.  HENT:     Head: Normocephalic and atraumatic.     Right Ear: External ear normal.     Left Ear: External ear normal.     Nose: Nose normal.     Mouth/Throat:      Mouth: Mucous membranes are moist.     Pharynx: Oropharynx is clear.  Eyes:     Conjunctiva/sclera: Conjunctivae normal.  Cardiovascular:     Rate and Rhythm: Normal rate and regular rhythm.     Heart sounds: Normal heart sounds. No murmur heard. No friction rub. No gallop.   Pulmonary:     Effort: Pulmonary effort is normal.     Breath sounds: Normal breath sounds. No wheezing, rhonchi or rales.  Abdominal:     Palpations: Abdomen is soft.  Musculoskeletal:     Cervical back: Neck supple.  Skin:    General: Skin is warm.     Findings: No rash.  Neurological:     Mental Status: She is alert and oriented to person, place, and time.  Psychiatric:        Behavior: Behavior normal.    The plan was reviewed with the patient/family, and all questions/concerned were addressed.  It was my pleasure to see Victorino DikeJennifer today and participate in her care. Please feel free to contact me with any questions or concerns.  Sincerely,  Wyline MoodYoon Janya Eveland, DO  Allergy & Immunology  [facility] office: [place] office: [phone]

## 2020-05-25 ENCOUNTER — Other Ambulatory Visit: Payer: Self-pay

## 2020-05-25 ENCOUNTER — Ambulatory Visit: Payer: BC Managed Care – PPO | Admitting: Allergy

## 2020-05-25 ENCOUNTER — Encounter: Payer: Self-pay | Admitting: Allergy

## 2020-05-25 VITALS — BP 136/90 | HR 71 | Temp 96.8°F | Resp 17 | Ht 70.28 in | Wt 228.0 lb

## 2020-05-25 DIAGNOSIS — L299 Pruritus, unspecified: Secondary | ICD-10-CM

## 2020-05-25 DIAGNOSIS — R232 Flushing: Secondary | ICD-10-CM | POA: Diagnosis not present

## 2020-05-25 DIAGNOSIS — T7840XD Allergy, unspecified, subsequent encounter: Secondary | ICD-10-CM

## 2020-05-25 DIAGNOSIS — M2540 Effusion, unspecified joint: Secondary | ICD-10-CM | POA: Diagnosis not present

## 2020-05-25 MED ORDER — FAMOTIDINE 20 MG PO TABS: 20.0000 mg | ORAL_TABLET | Freq: Two times a day (BID) | ORAL | 5 refills | Status: DC

## 2020-05-25 NOTE — Patient Instructions (Addendum)
Start Zyrtec (ceterizine) 10mg  daily at night. Start Pepcid (famotidine) 20mg  twice a day.   . Get bloodwork:  o We are ordering labs, so please allow 1-2 weeks for the results to come back. o With the newly implemented Cures Act, the labs might be visible to you at the same time that they become visible to me. However, I will not address the results until all of the results are back, so please be patient.   Mast cell specialist:  Follow up in 2 months or sooner if needed.

## 2020-05-26 ENCOUNTER — Encounter: Payer: Self-pay | Admitting: Allergy

## 2020-05-26 DIAGNOSIS — M2540 Effusion, unspecified joint: Secondary | ICD-10-CM | POA: Insufficient documentation

## 2020-05-26 DIAGNOSIS — L299 Pruritus, unspecified: Secondary | ICD-10-CM | POA: Insufficient documentation

## 2020-05-26 DIAGNOSIS — R232 Flushing: Secondary | ICD-10-CM | POA: Insufficient documentation

## 2020-05-26 NOTE — Assessment & Plan Note (Signed)
Patient present with a constellation of symptoms including flushing, rash, pruritus, joint swelling, nasal congestion, rhinorrhea, migraines/headaches, reflux, nausea/vomiting, diarrhea, abdominal cramping for many years. 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 She is concerned about mast cell activation syndrome - no prior tryptase level drawn or anaphylaxis.   Discussed with patient that mast call disorders are rare and she has other diagnoses that may be causing her symptoms.   Will get some basic bloodwork and urine tests as below.   Start Zyrtec (ceterizine) 10mg  daily at night.  Start Pepcid (famotidine) 20mg  twice a day.  See if they help her symptoms.   Also provided patient with a website where she can find physicians that specifically deal with mast cell disorders - the closest one is in .

## 2020-06-01 DIAGNOSIS — M25562 Pain in left knee: Secondary | ICD-10-CM | POA: Diagnosis not present

## 2020-06-01 DIAGNOSIS — M25551 Pain in right hip: Secondary | ICD-10-CM | POA: Diagnosis not present

## 2020-06-01 DIAGNOSIS — M25552 Pain in left hip: Secondary | ICD-10-CM | POA: Diagnosis not present

## 2020-06-07 DIAGNOSIS — Z79899 Other long term (current) drug therapy: Secondary | ICD-10-CM | POA: Diagnosis not present

## 2020-06-08 DIAGNOSIS — Z713 Dietary counseling and surveillance: Secondary | ICD-10-CM | POA: Diagnosis not present

## 2020-06-09 DIAGNOSIS — L299 Pruritus, unspecified: Secondary | ICD-10-CM | POA: Diagnosis not present

## 2020-06-09 DIAGNOSIS — R232 Flushing: Secondary | ICD-10-CM | POA: Diagnosis not present

## 2020-06-09 DIAGNOSIS — T7840XD Allergy, unspecified, subsequent encounter: Secondary | ICD-10-CM | POA: Diagnosis not present

## 2020-06-09 DIAGNOSIS — M2540 Effusion, unspecified joint: Secondary | ICD-10-CM | POA: Diagnosis not present

## 2020-06-16 LAB — CATECHOLAMINES, FRACTIONATED, URINE, 24 HOUR
Dopamine , 24H Ur: 84 ug/24 hr (ref 0–510)
Dopamine, Rand Ur: 99 ug/L
Epinephrine, 24H Ur: 2 ug/24 hr (ref 0–20)
Epinephrine, Rand Ur: 2 ug/L
Norepinephrine, 24H Ur: 15 ug/24 hr (ref 0–135)
Norepinephrine, Rand Ur: 18 ug/L

## 2020-06-18 LAB — COMPREHENSIVE METABOLIC PANEL
ALT: 21 IU/L (ref 0–32)
AST: 21 IU/L (ref 0–40)
Albumin/Globulin Ratio: 1.8 (ref 1.2–2.2)
Albumin: 4.9 g/dL — ABNORMAL HIGH (ref 3.8–4.8)
Alkaline Phosphatase: 89 IU/L (ref 44–121)
BUN/Creatinine Ratio: 21 (ref 9–23)
BUN: 17 mg/dL (ref 6–24)
Bilirubin Total: 0.3 mg/dL (ref 0.0–1.2)
CO2: 23 mmol/L (ref 20–29)
Calcium: 9.8 mg/dL (ref 8.7–10.2)
Chloride: 100 mmol/L (ref 96–106)
Creatinine, Ser: 0.82 mg/dL (ref 0.57–1.00)
Globulin, Total: 2.8 g/dL (ref 1.5–4.5)
Glucose: 106 mg/dL — ABNORMAL HIGH (ref 65–99)
Potassium: 4.2 mmol/L (ref 3.5–5.2)
Sodium: 138 mmol/L (ref 134–144)
Total Protein: 7.7 g/dL (ref 6.0–8.5)
eGFR: 90 mL/min/{1.73_m2} (ref >59)

## 2020-06-18 LAB — CBC WITH DIFFERENTIAL/PLATELET
Basophils Absolute: 0.1 x10E3/uL (ref 0.0–0.2)
Basos: 1 %
EOS (ABSOLUTE): 0.1 x10E3/uL (ref 0.0–0.4)
Eos: 1 %
Hematocrit: 45.2 % (ref 34.0–46.6)
Hemoglobin: 15.7 g/dL (ref 11.1–15.9)
Immature Grans (Abs): 0 x10E3/uL (ref 0.0–0.1)
Immature Granulocytes: 0 %
Lymphocytes Absolute: 1.7 x10E3/uL (ref 0.7–3.1)
Lymphs: 23 %
MCH: 30.2 pg (ref 26.6–33.0)
MCHC: 34.7 g/dL (ref 31.5–35.7)
MCV: 87 fL (ref 79–97)
Monocytes Absolute: 0.6 x10E3/uL (ref 0.1–0.9)
Monocytes: 9 %
Neutrophils Absolute: 4.8 x10E3/uL (ref 1.4–7.0)
Neutrophils: 66 %
Platelets: 374 x10E3/uL (ref 150–450)
RBC: 5.2 x10E6/uL (ref 3.77–5.28)
RDW: 12.9 % (ref 11.7–15.4)
WBC: 7.2 x10E3/uL (ref 3.4–10.8)

## 2020-06-18 LAB — SEDIMENTATION RATE: Sed Rate: 25 mm/hr (ref 0–32)

## 2020-06-18 LAB — C-REACTIVE PROTEIN: CRP: 3 mg/L (ref 0–10)

## 2020-06-18 LAB — TRYPTASE: Tryptase: 4.1 ug/L (ref 2.2–13.2)

## 2020-06-18 LAB — ANA W/REFLEX: Anti Nuclear Antibody (ANA): NEGATIVE

## 2020-06-18 LAB — CHRONIC URTICARIA: cu index: <2.8 (ref <10)

## 2020-06-18 LAB — THYROID CASCADE PROFILE: TSH: 1.57 u[IU]/mL (ref 0.450–4.500)

## 2020-06-18 LAB — C3 AND C4
Complement C3, Serum: 164 mg/dL (ref 82–167)
Complement C4, Serum: 34 mg/dL (ref 12–38)

## 2020-06-23 DIAGNOSIS — M7062 Trochanteric bursitis, left hip: Secondary | ICD-10-CM | POA: Diagnosis not present

## 2020-06-23 DIAGNOSIS — Z96642 Presence of left artificial hip joint: Secondary | ICD-10-CM | POA: Diagnosis not present

## 2020-06-23 DIAGNOSIS — M7061 Trochanteric bursitis, right hip: Secondary | ICD-10-CM | POA: Diagnosis not present

## 2020-06-23 LAB — METANEPHRINES, URINE, 24 HOUR
Metaneph Total, Ur: 23 ug/L
Metanephrines, 24H Ur: 41 ug/24 hr (ref 36–209)
Normetanephrine, 24H Ur: 176 ug/24 hr (ref 131–612)
Normetanephrine, Ur: 98 ug/L

## 2020-06-23 LAB — 5 HIAA, QUANTITATIVE, URINE, 24 HOUR
5-HIAA, Ur: 2.4 mg/L
5-HIAA,Quant.,24 Hr Urine: 4.3 mg/24 hr (ref 0.0–14.9)

## 2020-06-24 DIAGNOSIS — H43393 Other vitreous opacities, bilateral: Secondary | ICD-10-CM | POA: Diagnosis not present

## 2020-06-24 DIAGNOSIS — H5203 Hypermetropia, bilateral: Secondary | ICD-10-CM | POA: Diagnosis not present

## 2020-06-24 DIAGNOSIS — H0102A Squamous blepharitis right eye, upper and lower eyelids: Secondary | ICD-10-CM | POA: Diagnosis not present

## 2020-06-24 DIAGNOSIS — H0102B Squamous blepharitis left eye, upper and lower eyelids: Secondary | ICD-10-CM | POA: Diagnosis not present

## 2020-06-24 DIAGNOSIS — H524 Presbyopia: Secondary | ICD-10-CM | POA: Diagnosis not present

## 2020-06-24 DIAGNOSIS — H10413 Chronic giant papillary conjunctivitis, bilateral: Secondary | ICD-10-CM | POA: Diagnosis not present

## 2020-06-24 DIAGNOSIS — H52203 Unspecified astigmatism, bilateral: Secondary | ICD-10-CM | POA: Diagnosis not present

## 2020-07-20 DIAGNOSIS — J019 Acute sinusitis, unspecified: Secondary | ICD-10-CM | POA: Diagnosis not present

## 2020-07-20 DIAGNOSIS — R0981 Nasal congestion: Secondary | ICD-10-CM | POA: Diagnosis not present

## 2020-07-20 DIAGNOSIS — B349 Viral infection, unspecified: Secondary | ICD-10-CM | POA: Diagnosis not present

## 2020-07-20 DIAGNOSIS — Z03818 Encounter for observation for suspected exposure to other biological agents ruled out: Secondary | ICD-10-CM | POA: Diagnosis not present

## 2020-07-20 DIAGNOSIS — R059 Cough, unspecified: Secondary | ICD-10-CM | POA: Diagnosis not present

## 2020-07-28 NOTE — Progress Notes (Signed)
Follow Up Note  RE: Angela Harrison MRN: [id number] DOB: [birth year] Date of Office Visit: 07/29/2020  Referring provider: Farris Has, MD Primary care provider: Farris Has, MD  Chief Complaint: Allergic Rhinitis  (Says she has no change.) and flushing (Says it comes and goes. Major tempeture changes cause flare ups. Consumption of alcohol make her have a flare up as well. )  History of Present Illness: I had the pleasure of seeing Angela Harrison for a follow up visit at the [facility] on 07/29/2020. She is a 45 y.o. female, who is being followed for flushing. Her previous allergy office visit was on 05/25/2020 with Angela Harrison. Today is a regular follow up visit.  Flushing Bloodwork was normal. Patient taking zyrtec 10mg  daily and famotidine 20mg  daily with no improvement in her flushing. She did not take the medications twice a day.  Patient currently taking amoxicillin for a sinus infection.   Heat and alcohol definitely makes things worse.  Assessment and Plan: Angela Harrison is a 45 y.o. female with: Flushing Past history - Patient present with a constellation of symptoms including flushing, rash, pruritus, joint swelling, nasal congestion, rhinorrhea, migraines/headaches, reflux, nausea/vomiting, diarrhea, abdominal cramping for many years. 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 She is concerned about mast cell activation syndrome - no prior tryptase level drawn or anaphylaxis.  Interim history - CBC diff, CMP, TSH, ANA, CU, tryptase and urine tests all normal. Symptoms unchanged. Heat and alcohol makes flushing worse.  Based on results, low likelihood for mast cell disorders.   Will do 1 month trial of high dose antihistamines with leukotriene modifier to see if it helps.   Start Zyrtec (ceterizine) 10mg  twice a  day  Start Pepcid (famotidine) 20mg  twice a day.  Start Singulair (montelukast) 10mg  daily at night.  Cautioned that in some children/adults can experience behavioral changes including hyperactivity, agitation, depression, sleep disturbances and suicidal ideations. These side effects are rare, but if you notice them you should notify me and discontinue Singulair (montelukast).  Do the above regimen for 1 month and if not effective stop.   Consider evaluation by endocrinology next and/or seeing a mast cell specialist if still concerned about mast cell issues.   Limit alcohol intake and getting overheated.   Return in about 2 months (around 09/28/2020).  Meds ordered this encounter  Medications  . montelukast (SINGULAIR) 10 MG tablet    Sig: Take 1 tablet (10 mg total) by mouth at bedtime.    Dispense:  30 tablet    Refill:  3   Lab Orders  No laboratory test(s) ordered today    Diagnostics: None.  Medication List:  Current Outpatient Medications  Medication Sig Dispense Refill  . amLODipine (NORVASC) 5 MG tablet Take 1 tablet (5 mg total) by mouth daily. 30 tablet 0  . amoxicillin-clavulanate (AUGMENTIN) 875-125 MG tablet Take 1 tablet by mouth 2 (two) times daily.    . benzonatate (TESSALON) 100 MG capsule Take 100-200 mg by mouth 3 (three) times daily as needed.    . cetirizine (ZYRTEC) 10 MG tablet Take 10 mg by mouth daily.    . diclofenac (VOLTAREN) 75 MG EC tablet diclofenac sodium 75 mg tablet,delayed release  Take 1 tablet twice a day by oral route.    . famotidine (PEPCID) 20 MG tablet Take 1 tablet (20 mg  total) by mouth 2 (two) times daily. 60 tablet 5  . folic acid (FOLVITE) 1 MG tablet Take 1 tablet (1 mg total) by mouth daily. 30 tablet 0  . Galcanezumab-gnlm (EMGALITY) 120 MG/ML SOAJ 120 mg every 30 (thirty) days.    Marland Kitchen HYDROcodone-acetaminophen (NORCO) 10-325 MG tablet Take 1 tablet by mouth every 6 (six) hours as needed for moderate pain.    . meclizine  (ANTIVERT) 50 MG tablet Take by mouth.    . metoprolol succinate (TOPROL-XL) 50 MG 24 hr tablet Take 50 mg by mouth at bedtime.   11  . montelukast (SINGULAIR) 10 MG tablet Take 1 tablet (10 mg total) by mouth at bedtime. 30 tablet 3  . ondansetron (ZOFRAN ODT) 4 MG disintegrating tablet Take 1 tablet (4 mg total) by mouth every 6 (six) hours as needed for nausea or vomiting. 20 tablet 0  . promethazine (PHENERGAN) 25 MG suppository Place 25 mg rectally every 6 (six) hours as needed for nausea or vomiting.     . sertraline (ZOLOFT) 50 MG tablet Take 50 mg by mouth at bedtime.   11  . SUMAtriptan (IMITREX) 6 MG/0.5ML SOLN injection Inject 6 mg into the skin every 2 (two) hours as needed for migraine or headache. May repeat in 2 hours if headache persists or recurs.    Marland Kitchen tiZANidine (ZANAFLEX) 4 MG tablet Take 4 mg by mouth every 6 (six) hours as needed for muscle spasms.    . ferrous sulfate 325 (65 FE) MG tablet Take 1 tablet (325 mg total) by mouth daily with breakfast. (Patient not taking: Reported on 07/29/2020) 30 tablet 0  . Ibuprofen 200 MG CAPS  (Patient not taking: Reported on 07/29/2020)    . pantoprazole (PROTONIX) 40 MG tablet Take 1 tablet (40 mg total) by mouth daily. 30 tablet 0  . simethicone (MYLICON) 80 MG chewable tablet Chew 1 tablet (80 mg total) by mouth 4 (four) times daily as needed for flatulence. (Patient not taking: Reported on 07/29/2020) 30 tablet 0   No current facility-administered medications for this visit.   Allergies: Allergies  Allergen Reactions  . Cephalosporins Hives, Swelling, Rash and Other (See Comments)    Can tolerate Penicillin  . Cefazolin Hives, Swelling, Rash and Other (See Comments)    Can tolerate Penicillin   . Cephalexin Hives, Swelling, Rash and Other (See Comments)    Can tolerate Penicillin    I reviewed her past medical history, social history, family history, and environmental history and no significant changes have been reported from her  previous visit.  Review of Systems  Constitutional: Negative for appetite change, chills, fever and unexpected weight change.  HENT: Positive for congestion, postnasal drip, rhinorrhea and sore throat.   Respiratory: Negative for cough, chest tightness, shortness of breath and wheezing.   Cardiovascular: Negative for chest pain.  Gastrointestinal: Positive for abdominal pain, diarrhea, nausea and vomiting.  Genitourinary: Negative for difficulty urinating.  Skin: Negative for rash.  Neurological: Positive for dizziness, light-headedness and headaches.   Objective: BP 118/76   Pulse 69   Temp (!) 96.3 F (35.7 C)   Resp 14   Ht 5\' 10"  (1.778 m)   Wt 235 lb 12.8 oz (107 kg)   SpO2 98%   BMI 33.83 kg/m  Body mass index is 33.83 kg/m. Physical Exam Vitals and nursing note reviewed.  Constitutional:      Appearance: Normal appearance. She is well-developed.  HENT:     Head: Normocephalic and atraumatic.  Right Ear: Tympanic membrane and external ear normal.     Left Ear: Tympanic membrane and external ear normal.     Nose: Congestion and rhinorrhea present.     Mouth/Throat:     Mouth: Mucous membranes are moist.     Pharynx: Oropharynx is clear.  Eyes:     Conjunctiva/sclera: Conjunctivae normal.  Cardiovascular:     Rate and Rhythm: Normal rate and regular rhythm.     Heart sounds: Normal heart sounds. No murmur heard. No friction rub. No gallop.   Pulmonary:     Effort: Pulmonary effort is normal.     Breath sounds: Normal breath sounds. No wheezing, rhonchi or rales.  Abdominal:     Palpations: Abdomen is soft.  Musculoskeletal:     Cervical back: Neck supple.  Skin:    General: Skin is warm.     Findings: No rash.  Neurological:     Mental Status: She is alert and oriented to person, place, and time.  Psychiatric:        Behavior: Behavior normal.    Previous notes and tests were reviewed. The plan was reviewed with the patient/family, and all  questions/concerned were addressed.  It was my pleasure to see Angela Harrison today and participate in her care. Please feel free to contact me with any questions or concerns.  Sincerely,  Wyline Mood, DO Allergy & Immunology  [facility] office: [phone] [facility] office: [phone]

## 2020-07-29 ENCOUNTER — Encounter: Payer: Self-pay | Admitting: Allergy

## 2020-07-29 ENCOUNTER — Other Ambulatory Visit: Payer: Self-pay

## 2020-07-29 ENCOUNTER — Ambulatory Visit: Payer: BC Managed Care – PPO | Admitting: Allergy

## 2020-07-29 VITALS — BP 118/76 | HR 69 | Temp 96.3°F | Resp 14 | Ht 70.0 in | Wt 235.8 lb

## 2020-07-29 DIAGNOSIS — R232 Flushing: Secondary | ICD-10-CM | POA: Diagnosis not present

## 2020-07-29 MED ORDER — MONTELUKAST SODIUM 10 MG PO TABS
10.0000 mg | ORAL_TABLET | Freq: Every day | ORAL | 3 refills | Status: DC
Start: 2020-07-29 — End: 2020-10-05

## 2020-07-29 NOTE — Assessment & Plan Note (Signed)
Past history - Patient present with a constellation of symptoms including flushing, rash, pruritus, joint swelling, nasal congestion, rhinorrhea, migraines/headaches, reflux, nausea/vomiting, diarrhea, abdominal cramping for many years. 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 She is concerned about mast cell activation syndrome - no prior tryptase level drawn or anaphylaxis.  Interim history - CBC diff, CMP, TSH, ANA, CU, tryptase and urine tests all normal. Symptoms unchanged. Heat and alcohol makes flushing worse.  Based on results, low likelihood for mast cell disorders.   Will do 1 month trial of high dose antihistamines with leukotriene modifier to see if it helps.   Start Zyrtec (ceterizine) 10mg  twice a day  Start Pepcid (famotidine) 20mg  twice a day.  Start Singulair (montelukast) 10mg  daily at night.  Cautioned that in some children/adults can experience behavioral changes including hyperactivity, agitation, depression, sleep disturbances and suicidal ideations. These side effects are rare, but if you notice them you should notify me and discontinue Singulair (montelukast).  Do the above regimen for 1 month and if not effective stop.   Consider evaluation by endocrinology next and/or seeing a mast cell specialist if still concerned about mast cell issues.   Limit alcohol intake and getting overheated.

## 2020-07-29 NOTE — Patient Instructions (Addendum)
   Start Zyrtec (ceterizine) 10mg  twice a day  Start Pepcid (famotidine) 20mg  twice a day.  Start Singulair (montelukast) 10mg  daily at night.  Cautioned that in some children/adults can experience behavioral changes including hyperactivity, agitation, depression, sleep disturbances and suicidal ideations. These side effects are rare, but if you notice them you should notify me and discontinue Singulair (montelukast).  Do the above regimen for 1 month.  Consider evaluation by endocrinology next.  Follow up in 2 months or sooner if needed.

## 2020-08-03 DIAGNOSIS — Z713 Dietary counseling and surveillance: Secondary | ICD-10-CM | POA: Diagnosis not present

## 2020-08-04 DIAGNOSIS — M25551 Pain in right hip: Secondary | ICD-10-CM | POA: Diagnosis not present

## 2020-08-04 DIAGNOSIS — Z96642 Presence of left artificial hip joint: Secondary | ICD-10-CM | POA: Diagnosis not present

## 2020-08-04 DIAGNOSIS — Z96641 Presence of right artificial hip joint: Secondary | ICD-10-CM | POA: Diagnosis not present

## 2020-08-04 DIAGNOSIS — M25552 Pain in left hip: Secondary | ICD-10-CM | POA: Diagnosis not present

## 2020-10-04 NOTE — Progress Notes (Signed)
Follow Up Note  RE: Angela Harrison MRN: [id number] DOB: 09-13-1975 Date of Office Visit: 10/05/2020  Referring provider: Farris Has, MD Primary care provider: Farris Has, MD  Chief Complaint: flushing  History of Present Illness: I had the pleasure of seeing Angela Harrison for a follow up visit at the [facility] on 10/05/2020. She is a 45 y.o. female, who is being followed for flushing. Her previous allergy office visit was on 07/29/2020 with Dr. Selena Batten. Today is a regular follow up visit.  Flushing Patient tried zyrtec 10mg  twice a day, Pepcid 20mg  twice a day and Singulair 10mg  daily for 1 month with no improvement in symptoms.  She still has flushing episodes but it does get worse after drinking alcohol and getting overheated.  Denies any other associated symptoms or changes.   Assessment and Plan: Lashell is a 45 y.o. female with: Flushing Past history - Patient present with a constellation of symptoms including flushing, rash, pruritus, joint swelling, nasal congestion, rhinorrhea, migraines/headaches, reflux, nausea/vomiting, diarrhea, abdominal cramping for many years. 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 CBC diff, CMP, TSH, ANA, CU, tryptase and urine tests all normal Interim history - took zyrtec 10mg  BID, Pepcid 20mg  BID and Singulair 10mg  daily for 1 month with no improvement in symptoms.  Limit alcohol intake and getting overheated.  Recommend evaluation by endocrinology next. See below for proper skin care.  Based on results, low likelihood for mast cell disorders.  Stop zyrtec, Pepcid, Singulair as ineffective.  Return if symptoms worsen or fail to improve.  No orders of the defined types were placed in this encounter.  Lab Orders  No laboratory test(s) ordered today     Diagnostics: None.  Medication List:  Current Outpatient Medications  Medication Sig Dispense Refill   amLODipine (NORVASC) 5 MG tablet Take 1 tablet (5 mg total) by mouth daily. 30 tablet 0   diclofenac (VOLTAREN) 75 MG EC tablet diclofenac sodium 75 mg tablet,delayed release  Take 1 tablet twice a day by oral route.     ferrous sulfate 325 (65 FE) MG tablet Take 1 tablet (325 mg total) by mouth daily with breakfast. 30 tablet 0   folic acid (FOLVITE) 1 MG tablet Take 1 tablet (1 mg total) by mouth daily. 30 tablet 0   HYDROcodone-acetaminophen (NORCO) 10-325 MG tablet Take 1 tablet by mouth every 6 (six) hours as needed for moderate pain.     meclizine (ANTIVERT) 50 MG tablet Take by mouth.     metoprolol succinate (TOPROL-XL) 50 MG 24 hr tablet Take 50 mg by mouth at bedtime.   11   ondansetron (ZOFRAN ODT) 4 MG disintegrating tablet Take 1 tablet (4 mg total) by mouth every 6 (six) hours as needed for nausea or vomiting. 20 tablet 0   promethazine (PHENERGAN) 25 MG suppository Place 25 mg rectally every 6 (six) hours as needed for nausea or vomiting.      sertraline (ZOLOFT) 50 MG tablet Take 50 mg by mouth at bedtime.   11   SUMAtriptan (IMITREX) 6 MG/0.5ML SOLN injection Inject 6 mg into the skin every 2 (two) hours as needed for migraine or headache. May repeat in 2 hours if headache persists or recurs.     tiZANidine (ZANAFLEX) 4 MG tablet Take 4 mg by mouth every 6 (six) hours as needed for  muscle spasms.     Ubrogepant 100 MG TABS Take by mouth.     Galcanezumab-gnlm (EMGALITY) 120 MG/ML SOAJ 120 mg every 30 (thirty) days. (Patient not taking: Reported on 10/05/2020)     pantoprazole (PROTONIX) 40 MG tablet Take 1 tablet (40 mg total) by mouth daily. 30 tablet 0   No current facility-administered medications for this visit.   Allergies: Allergies  Allergen Reactions   Cephalosporins Hives, Swelling, Rash and Other (See Comments)    Can tolerate Penicillin   Cefazolin  Hives, Swelling, Rash and Other (See Comments)    Can tolerate Penicillin    Cephalexin Hives, Swelling, Rash and Other (See Comments)    Can tolerate Penicillin    Diclofenac Sodium Rash   I reviewed her past medical history, social history, family history, and environmental history and no significant changes have been reported from her previous visit.  Review of Systems  Constitutional:  Negative for appetite change, chills, fever and unexpected weight change.  Respiratory:  Negative for cough, chest tightness, shortness of breath and wheezing.   Cardiovascular:  Negative for chest pain.  Gastrointestinal:  Positive for abdominal pain, diarrhea, nausea and vomiting.  Genitourinary:  Negative for difficulty urinating.  Skin:  Positive for rash.  Neurological:  Positive for dizziness, light-headedness and headaches.   Objective: BP 132/84   Pulse 84   Temp (!) 97.2 F (36.2 C) (Temporal)   SpO2 98%  There is no height or weight on file to calculate BMI. Physical Exam Vitals and nursing note reviewed.  Constitutional:      Appearance: Normal appearance. She is well-developed.  HENT:     Head: Normocephalic and atraumatic.     Right Ear: Tympanic membrane and external ear normal.     Left Ear: Tympanic membrane and external ear normal.     Nose: Nose normal.     Mouth/Throat:     Mouth: Mucous membranes are moist.     Pharynx: Oropharynx is clear.  Eyes:     Conjunctiva/sclera: Conjunctivae normal.  Cardiovascular:     Rate and Rhythm: Normal rate and regular rhythm.     Heart sounds: Normal heart sounds. No murmur heard.   No friction rub. No gallop.  Pulmonary:     Effort: Pulmonary effort is normal.     Breath sounds: Normal breath sounds. No wheezing, rhonchi or rales.  Musculoskeletal:     Cervical back: Neck supple.  Skin:    General: Skin is warm.     Findings: No rash.  Neurological:     Mental Status: She is alert and oriented to person, place, and time.   Psychiatric:        Behavior: Behavior normal.   Previous notes and tests were reviewed. The plan was reviewed with the patient/family, and all questions/concerned were addressed.  It was my pleasure to see Angela Harrison today and participate in her care. Please feel free to contact me with any questions or concerns.  Sincerely,  Wyline Mood, DO Allergy & Immunology  [facility] office: [phone] [facility] office: [phone]

## 2020-10-05 ENCOUNTER — Encounter: Payer: Self-pay | Admitting: Allergy

## 2020-10-05 ENCOUNTER — Ambulatory Visit: Payer: BC Managed Care – PPO | Admitting: Allergy

## 2020-10-05 ENCOUNTER — Other Ambulatory Visit: Payer: Self-pay

## 2020-10-05 VITALS — BP 132/84 | HR 84 | Temp 97.2°F

## 2020-10-05 DIAGNOSIS — R232 Flushing: Secondary | ICD-10-CM | POA: Diagnosis not present

## 2020-10-05 NOTE — Assessment & Plan Note (Signed)
Past history - Patient present with a constellation of symptoms including flushing, rash, pruritus, joint swelling, nasal congestion, rhinorrhea, migraines/headaches, reflux, nausea/vomiting, diarrhea, abdominal cramping for many years. 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 CBC diff, CMP, TSH, ANA, CU, tryptase and urine tests all normal Interim history - took zyrtec 10mg  BID, Pepcid 20mg  BID and Singulair 10mg  daily for 1 month with no improvement in symptoms.   Limit alcohol intake and getting overheated.   Recommend evaluation by endocrinology next.  See below for proper skin care.   Based on results, low likelihood for mast cell disorders.   Stop zyrtec, Pepcid, Singulair as ineffective.

## 2020-10-05 NOTE — Patient Instructions (Addendum)
Limit alcohol intake and getting overheated.  Recommend evaluation by endocrinology next. See below for proper skin care.   Follow up as needed.  Skin care recommendations  Bath time: Always use lukewarm water. AVOID very hot or cold water. Keep bathing time to 5-10 minutes. Do NOT use bubble bath. Use a mild soap and use just enough to wash the dirty areas. Do NOT scrub skin vigorously.  After bathing, pat dry your skin with a towel. Do NOT rub or scrub the skin.  Moisturizers and prescriptions:  ALWAYS apply moisturizers immediately after bathing (within 3 minutes). This helps to lock-in moisture. Use the moisturizer several times a day over the whole body. Good summer moisturizers include: Aveeno, CeraVe, Cetaphil. Good winter moisturizers include: Aquaphor, Vaseline, Cerave, Cetaphil, Eucerin, Vanicream. When using moisturizers along with medications, the moisturizer should be applied about one hour after applying the medication to prevent diluting effect of the medication or moisturize around where you applied the medications. When not using medications, the moisturizer can be continued twice daily as maintenance.  Laundry and clothing: Avoid laundry products with added color or perfumes. Use unscented hypo-allergenic laundry products such as Tide free, Cheer free & gentle, and All free and clear.  If the skin still seems dry or sensitive, you can try double-rinsing the clothes. Avoid tight or scratchy clothing such as wool. Do not use fabric softeners or dyer sheets.

## 2020-10-07 DIAGNOSIS — G894 Chronic pain syndrome: Secondary | ICD-10-CM | POA: Diagnosis not present

## 2020-10-19 DIAGNOSIS — Z713 Dietary counseling and surveillance: Secondary | ICD-10-CM | POA: Diagnosis not present

## 2020-11-10 DIAGNOSIS — D649 Anemia, unspecified: Secondary | ICD-10-CM | POA: Diagnosis not present

## 2020-11-10 DIAGNOSIS — Q796 Ehlers-Danlos syndrome, unspecified: Secondary | ICD-10-CM | POA: Diagnosis not present

## 2020-11-10 DIAGNOSIS — I10 Essential (primary) hypertension: Secondary | ICD-10-CM | POA: Diagnosis not present

## 2020-11-10 DIAGNOSIS — Z03818 Encounter for observation for suspected exposure to other biological agents ruled out: Secondary | ICD-10-CM | POA: Diagnosis not present

## 2020-11-10 DIAGNOSIS — R059 Cough, unspecified: Secondary | ICD-10-CM | POA: Diagnosis not present

## 2020-11-10 DIAGNOSIS — R051 Acute cough: Secondary | ICD-10-CM | POA: Diagnosis not present

## 2020-11-13 DIAGNOSIS — U071 COVID-19: Secondary | ICD-10-CM | POA: Diagnosis not present

## 2020-11-17 DIAGNOSIS — R0789 Other chest pain: Secondary | ICD-10-CM | POA: Diagnosis not present

## 2020-11-17 DIAGNOSIS — U071 COVID-19: Secondary | ICD-10-CM | POA: Diagnosis not present

## 2020-11-17 DIAGNOSIS — R059 Cough, unspecified: Secondary | ICD-10-CM | POA: Diagnosis not present

## 2020-12-02 DIAGNOSIS — I10 Essential (primary) hypertension: Secondary | ICD-10-CM | POA: Diagnosis not present

## 2020-12-02 DIAGNOSIS — Z13220 Encounter for screening for lipoid disorders: Secondary | ICD-10-CM | POA: Diagnosis not present

## 2020-12-02 DIAGNOSIS — D649 Anemia, unspecified: Secondary | ICD-10-CM | POA: Diagnosis not present

## 2020-12-04 DIAGNOSIS — Z1231 Encounter for screening mammogram for malignant neoplasm of breast: Secondary | ICD-10-CM | POA: Diagnosis not present

## 2020-12-13 ENCOUNTER — Other Ambulatory Visit: Payer: Self-pay | Admitting: Family Medicine

## 2020-12-13 ENCOUNTER — Other Ambulatory Visit: Payer: Self-pay

## 2020-12-13 ENCOUNTER — Ambulatory Visit
Admission: RE | Admit: 2020-12-13 | Discharge: 2020-12-13 | Disposition: A | Discharge status: Home / Self Care | Payer: BC Managed Care – PPO | Source: Ambulatory Visit | Attending: Family Medicine | Admitting: Family Medicine

## 2020-12-13 DIAGNOSIS — R059 Cough, unspecified: Secondary | ICD-10-CM | POA: Diagnosis not present

## 2020-12-13 LAB — OTHER LAB TEST

## 2021-01-10 DIAGNOSIS — Q796 Ehlers-Danlos syndrome, unspecified: Secondary | ICD-10-CM | POA: Diagnosis not present

## 2021-01-10 DIAGNOSIS — M5136 Other intervertebral disc degeneration, lumbar region: Secondary | ICD-10-CM | POA: Diagnosis not present

## 2021-01-10 DIAGNOSIS — Z79891 Long term (current) use of opiate analgesic: Secondary | ICD-10-CM | POA: Diagnosis not present

## 2021-01-10 DIAGNOSIS — M5030 Other cervical disc degeneration, unspecified cervical region: Secondary | ICD-10-CM | POA: Diagnosis not present

## 2021-03-25 DIAGNOSIS — U071 COVID-19: Secondary | ICD-10-CM | POA: Diagnosis not present

## 2021-05-09 DIAGNOSIS — Q796 Ehlers-Danlos syndrome, unspecified: Secondary | ICD-10-CM | POA: Diagnosis not present

## 2021-05-09 DIAGNOSIS — Z79891 Long term (current) use of opiate analgesic: Secondary | ICD-10-CM | POA: Diagnosis not present

## 2021-05-09 DIAGNOSIS — G894 Chronic pain syndrome: Secondary | ICD-10-CM | POA: Diagnosis not present

## 2021-05-09 DIAGNOSIS — Z5181 Encounter for therapeutic drug level monitoring: Secondary | ICD-10-CM | POA: Diagnosis not present

## 2021-06-10 DIAGNOSIS — M25551 Pain in right hip: Secondary | ICD-10-CM | POA: Diagnosis not present

## 2021-06-10 DIAGNOSIS — M25552 Pain in left hip: Secondary | ICD-10-CM | POA: Diagnosis not present

## 2021-06-15 DIAGNOSIS — M25551 Pain in right hip: Secondary | ICD-10-CM | POA: Diagnosis not present

## 2021-06-15 DIAGNOSIS — M25552 Pain in left hip: Secondary | ICD-10-CM | POA: Diagnosis not present

## 2021-06-23 DIAGNOSIS — M25552 Pain in left hip: Secondary | ICD-10-CM | POA: Diagnosis not present

## 2021-06-23 DIAGNOSIS — M25551 Pain in right hip: Secondary | ICD-10-CM | POA: Diagnosis not present

## 2021-06-28 DIAGNOSIS — M25551 Pain in right hip: Secondary | ICD-10-CM | POA: Diagnosis not present

## 2021-06-28 DIAGNOSIS — M25552 Pain in left hip: Secondary | ICD-10-CM | POA: Diagnosis not present

## 2021-06-30 DIAGNOSIS — M25552 Pain in left hip: Secondary | ICD-10-CM | POA: Diagnosis not present

## 2021-06-30 DIAGNOSIS — M25551 Pain in right hip: Secondary | ICD-10-CM | POA: Diagnosis not present

## 2021-07-06 DIAGNOSIS — M25551 Pain in right hip: Secondary | ICD-10-CM | POA: Diagnosis not present

## 2021-07-06 DIAGNOSIS — M25552 Pain in left hip: Secondary | ICD-10-CM | POA: Diagnosis not present

## 2021-07-08 DIAGNOSIS — M25552 Pain in left hip: Secondary | ICD-10-CM | POA: Diagnosis not present

## 2021-07-08 DIAGNOSIS — M25551 Pain in right hip: Secondary | ICD-10-CM | POA: Diagnosis not present

## 2021-07-12 DIAGNOSIS — M25551 Pain in right hip: Secondary | ICD-10-CM | POA: Diagnosis not present

## 2021-07-12 DIAGNOSIS — M25552 Pain in left hip: Secondary | ICD-10-CM | POA: Diagnosis not present

## 2021-07-21 DIAGNOSIS — M25551 Pain in right hip: Secondary | ICD-10-CM | POA: Diagnosis not present

## 2021-07-21 DIAGNOSIS — M25552 Pain in left hip: Secondary | ICD-10-CM | POA: Diagnosis not present

## 2021-07-26 DIAGNOSIS — M25552 Pain in left hip: Secondary | ICD-10-CM | POA: Diagnosis not present

## 2021-07-26 DIAGNOSIS — M25551 Pain in right hip: Secondary | ICD-10-CM | POA: Diagnosis not present

## 2021-07-28 DIAGNOSIS — M25551 Pain in right hip: Secondary | ICD-10-CM | POA: Diagnosis not present

## 2021-07-28 DIAGNOSIS — M25552 Pain in left hip: Secondary | ICD-10-CM | POA: Diagnosis not present

## 2021-08-01 DIAGNOSIS — L237 Allergic contact dermatitis due to plants, except food: Secondary | ICD-10-CM | POA: Diagnosis not present

## 2021-08-09 DIAGNOSIS — M25552 Pain in left hip: Secondary | ICD-10-CM | POA: Diagnosis not present

## 2021-08-09 DIAGNOSIS — M25551 Pain in right hip: Secondary | ICD-10-CM | POA: Diagnosis not present

## 2021-08-16 DIAGNOSIS — M25552 Pain in left hip: Secondary | ICD-10-CM | POA: Diagnosis not present

## 2021-08-16 DIAGNOSIS — M25551 Pain in right hip: Secondary | ICD-10-CM | POA: Diagnosis not present

## 2021-08-25 DIAGNOSIS — M25551 Pain in right hip: Secondary | ICD-10-CM | POA: Diagnosis not present

## 2021-08-25 DIAGNOSIS — M25552 Pain in left hip: Secondary | ICD-10-CM | POA: Diagnosis not present

## 2021-08-30 DIAGNOSIS — M25552 Pain in left hip: Secondary | ICD-10-CM | POA: Diagnosis not present

## 2021-08-30 DIAGNOSIS — M25551 Pain in right hip: Secondary | ICD-10-CM | POA: Diagnosis not present

## 2021-09-05 DIAGNOSIS — M5136 Other intervertebral disc degeneration, lumbar region: Secondary | ICD-10-CM | POA: Diagnosis not present

## 2021-09-05 DIAGNOSIS — M5030 Other cervical disc degeneration, unspecified cervical region: Secondary | ICD-10-CM | POA: Diagnosis not present

## 2021-09-05 DIAGNOSIS — M25562 Pain in left knee: Secondary | ICD-10-CM | POA: Diagnosis not present

## 2021-09-05 DIAGNOSIS — M5020 Other cervical disc displacement, unspecified cervical region: Secondary | ICD-10-CM | POA: Diagnosis not present

## 2021-10-04 DIAGNOSIS — M25562 Pain in left knee: Secondary | ICD-10-CM | POA: Diagnosis not present

## 2021-11-01 DIAGNOSIS — D649 Anemia, unspecified: Secondary | ICD-10-CM | POA: Diagnosis not present

## 2021-11-01 DIAGNOSIS — Q796 Ehlers-Danlos syndrome, unspecified: Secondary | ICD-10-CM | POA: Diagnosis not present

## 2021-11-01 DIAGNOSIS — E785 Hyperlipidemia, unspecified: Secondary | ICD-10-CM | POA: Diagnosis not present

## 2021-11-01 DIAGNOSIS — I10 Essential (primary) hypertension: Secondary | ICD-10-CM | POA: Diagnosis not present

## 2021-11-06 DIAGNOSIS — M25562 Pain in left knee: Secondary | ICD-10-CM | POA: Diagnosis not present

## 2021-11-17 DIAGNOSIS — S83242D Other tear of medial meniscus, current injury, left knee, subsequent encounter: Secondary | ICD-10-CM | POA: Diagnosis not present

## 2021-11-17 DIAGNOSIS — S83242S Other tear of medial meniscus, current injury, left knee, sequela: Secondary | ICD-10-CM | POA: Diagnosis not present

## 2021-12-03 ENCOUNTER — Telehealth: Payer: BC Managed Care – PPO | Admitting: Nurse Practitioner

## 2021-12-03 DIAGNOSIS — U071 COVID-19: Secondary | ICD-10-CM

## 2021-12-03 MED ORDER — PROMETHAZINE-DM 6.25-15 MG/5ML PO SYRP: 5.0000 mL | ORAL_SOLUTION | Freq: Four times a day (QID) | ORAL | 0 refills | Status: AC | PRN

## 2021-12-03 MED ORDER — NIRMATRELVIR/RITONAVIR (PAXLOVID)TABLET: 3.0000 | ORAL_TABLET | Freq: Two times a day (BID) | ORAL | 0 refills | Status: AC

## 2021-12-03 NOTE — Patient Instructions (Signed)
Quarantine and Isolation Quarantine and isolation refer to local and travel restrictions to protect the public and travelers from contagious diseases that constitute a public health threat. Contagious diseases are diseases that can spread from one person to another. Quarantine and isolation help to protect the public by preventing exposure to people who have or may have a contagious disease. Isolation separates people who are sick with a contagious disease from people who are not sick. Quarantine separates and restricts the movement of people who were exposed to a contagious disease to see if they become sick. You may be put in quarantine or isolation if you have been exposed to or diagnosed with any of the following diseases: Severe acute respiratory syndromes, such as COVID-19. Cholera. Diphtheria. Tuberculosis. Plague. Smallpox. Yellow fever. Viral hemorrhagic fevers, such as [place], Ebola, and [place]. When to quarantine or isolate Follow these rules, whether you have been vaccinated or not: Stay home and isolate from others when you are sick with a contagious disease. Isolate when you test positive for a contagious disease, even if you do not have symptoms. Isolate if you are sick and suspect that you may have a contagious disease. If you suspect that you have a contagious disease, get tested. If your test results are negative, you can end your isolation. If your test results are positive, follow the full isolation recommendations as told by your health care provider or local health authorities. Quarantine and stay away from others when you have been in close contact with someone who has tested positive for a contagious disease. Close contact is defined as being less than 6 ft (1.8 m) away from an infected person for a total of 15 minutes or more over a 24-hour period. Do not go to places where you are unable to wear a mask, such as restaurants and some gyms. Stay home and separate  from others as much as possible. Avoid being around people who may get very sick from the contagious disease that you have. Use a separate bathroom, if possible. Do not travel. For travel guidance, visit the CDC's travel webpage at wwwnc.cdc.gov/travel/ Follow these instructions at home: Medicines  Take over-the-counter and prescription medicines as told by your health care provider. Finish all antibiotic medicine even when you start to feel better. Stay up to date with all your vaccines. Get scheduled vaccines and boosters as recommended by your health care provider. Lifestyle Wear a high-quality mask if you must be around others at home and in public, if recommended. Improve air flow (ventilation) at home to help prevent the disease from spreading to other people, if possible. Do not share personal household items, like cups, towels, and utensils. Practice everyday hygiene and cleaning. General instructions Talk to your health care provider if you have a weakened body defense system (immune system). People with a weakened immune system may have a reduced immune response to vaccines. You may need to follow current prevention measures, including wearing a well-fitting mask, avoiding crowds, and avoiding poorly ventilated indoor places. Monitor symptoms and follow health care provider instructions, which may include resting, drinking fluids, and taking medicines. Follow specific isolation and quarantine recommendations if you are in places that can lead to disease outbreaks, such as correctional and detention facilities, homeless shelters, and cruise ships. Return to your normal activities as told by your health care provider. Ask your health care provider what activities are safe for you. Keep all follow-up visits. This is important. Where to find more information CDC: www.cdc.gov/quarantine/index.html Contact   a health care provider if: You have a fever. You have signs and symptoms that  return or get worse after isolation. Get help right away if: You have difficulty breathing. You have chest pain. These symptoms may be an emergency. Get help right away. Call 911. Do not wait to see if the symptoms will go away. Do not drive yourself to the hospital. Summary Isolation and quarantine help protect the public by preventing exposure to people who have or may have a contagious disease. Isolate when you are sick or when you test positive, even if you do not have symptoms. Quarantine and stay away from others when you have been in close contact with someone who has tested positive for a contagious disease. This information is not intended to replace advice given to you by your health care provider. Make sure you discuss any questions you have with your health care provider. Document Revised: 03/24/2021 Document Reviewed: 03/03/2021 Elsevier Patient Education  2023 Elsevier Inc.  

## 2021-12-03 NOTE — Progress Notes (Signed)
Virtual Visit Consent   Angela Harrison, you are scheduled for a virtual visit with Mary-Margaret Daphine Deutscher, FNP, a [facility] provider, today.     Just as with appointments in the office, your consent must be obtained to participate.  Your consent will be active for this visit and any virtual visit you may have with one of our providers in the next 365 days.     If you have a MyChart account, a copy of this consent can be sent to you electronically.  All virtual visits are billed to your insurance company just like a traditional visit in the office.    As this is a virtual visit, video technology does not allow for your provider to perform a traditional examination.  This may limit your provider's ability to fully assess your condition.  If your provider identifies any concerns that need to be evaluated in person or the need to arrange testing (such as labs, EKG, etc.), we will make arrangements to do so.     Although advances in technology are sophisticated, we cannot ensure that it will always work on either your end or our end.  If the connection with a video visit is poor, the visit may have to be switched to a telephone visit.  With either a video or telephone visit, we are not always able to ensure that we have a secure connection.     I need to obtain your verbal consent now.   Are you willing to proceed with your visit today? YES   Angela Harrison has provided verbal consent on 12/03/2021 for a virtual visit (video or telephone).   Mary-Margaret Daphine Deutscher, FNP   Date: 12/03/2021 4:29 PM   Virtual Visit via Video Note   I, Mary-Margaret Daphine Deutscher, connected with Angela Harrison ([id number], Apr 03, 1975) on 12/03/21 at  5:00 PM EDT by a video-enabled telemedicine application and verified that I am speaking with the correct person using two identifiers.  Location: Patient: Virtual Visit Location Patient: Home Provider: Virtual Visit Location Provider: Mobile   I discussed the  limitations of evaluation and management by telemedicine and the availability of in person appointments. The patient expressed understanding and agreed to proceed.    History of Present Illness: Angela Harrison is a 46 y.o. who identifies as a female who was assigned female at birth, and is being seen today for covid positive.  HPI: Patient tested positive for covid today. She has multiple medical issues and needs to be treated.  URI  This is a new problem. The current episode started in the past 7 days. The problem has been gradually worsening. Associated symptoms include congestion, coughing, headaches, rhinorrhea and a sore throat. She has tried acetaminophen (nyquil) for the symptoms. The treatment provided mild relief.    Review of Systems  Constitutional:  Negative for chills and fever.  HENT:  Positive for congestion, rhinorrhea and sore throat.   Respiratory:  Positive for cough.   Musculoskeletal:  Positive for myalgias.  Neurological:  Positive for headaches.    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, unspecified laterality 12/14/2016   Closed left tibial fracture 04/11/2016   Fracture tibia/fibula, left, closed, initial encounter 04/11/2016   Obese 08/25/2015   S/P left THA, AA 08/24/2015   PANIC DISORDER WITHOUT AGORAPHOBIA 09/24/2007   MIGRAINE HEADACHE 09/24/2007    OTHER DISEASES OF VOCAL CORDS 09/24/2007   DYSPNEA 09/24/2007    Allergies:  Allergies  Allergen Reactions   Cephalosporins Hives, Swelling, Rash and Other (See Comments)    Can tolerate Penicillin   Cefazolin Hives, Swelling, Rash and Other (See Comments)    Can tolerate Penicillin    Cephalexin Hives, Swelling, Rash and Other (See Comments)    Can tolerate Penicillin    Diclofenac Sodium Rash   Medications:  Current Outpatient Medications:    amLODipine (NORVASC) 5 MG tablet, Take 1 tablet (5 mg total) by mouth daily., Disp: 30 tablet, Rfl: 0   diclofenac (VOLTAREN) 75 MG EC tablet, diclofenac sodium 75 mg tablet,delayed release  Take 1 tablet twice a day by oral route., Disp: , Rfl:    ferrous sulfate 325 (65 FE) MG tablet, Take 1 tablet (325 mg total) by mouth daily with breakfast., Disp: 30 tablet, Rfl: 0   folic acid (FOLVITE) 1 MG tablet, Take 1 tablet (1 mg total) by mouth daily., Disp: 30 tablet, Rfl: 0   Galcanezumab-gnlm (EMGALITY) 120 MG/ML SOAJ, 120 mg every 30 (thirty) days. (Patient not taking: Reported on 10/05/2020), Disp: , Rfl:    HYDROcodone-acetaminophen (NORCO) 10-325 MG tablet, Take 1 tablet by mouth every 6 (six) hours as needed for moderate pain., Disp: , Rfl:    meclizine (ANTIVERT) 50 MG tablet, Take by mouth., Disp: , Rfl:    metoprolol succinate (TOPROL-XL) 50 MG 24 hr tablet, Take 50 mg by mouth at bedtime. , Disp: , Rfl: 11   ondansetron (ZOFRAN ODT) 4 MG disintegrating tablet, Take 1 tablet (4 mg total) by mouth every 6 (six) hours as needed for nausea or vomiting., Disp: 20 tablet, Rfl: 0   pantoprazole (PROTONIX) 40 MG tablet, Take 1 tablet (40 mg total) by mouth daily., Disp: 30 tablet, Rfl: 0   promethazine (PHENERGAN) 25 MG suppository, Place 25 mg rectally every 6 (six) hours as needed for nausea or vomiting. , Disp: , Rfl:    sertraline (ZOLOFT) 50 MG tablet, Take 50 mg by mouth at bedtime. , Disp: , Rfl: 11   SUMAtriptan (IMITREX) 6 MG/0.5ML SOLN  injection, Inject 6 mg into the skin every 2 (two) hours as needed for migraine or headache. May repeat in 2 hours if headache persists or recurs., Disp: , Rfl:    tiZANidine (ZANAFLEX) 4 MG tablet, Take 4 mg by mouth every 6 (six) hours as needed for muscle spasms., Disp: , Rfl:    Ubrogepant 100 MG TABS, Take by mouth., Disp: , Rfl:   Observations/Objective: Patient is well-developed, well-nourished in no acute distress.  Resting comfortably  at home.  Head is normocephalic, atraumatic.  No labored breathing.  Speech is clear and coherent with logical content.  Patient is alert and oriented at baseline.  Raspy voice Cough nonproductive  Assessment and Plan:  Angela Harrison in today with chief complaint of Covid Positive   1. Positive self-administered antigen test for COVID-19 1. Take meds as prescribed 2.  Use a cool mist humidifier especially during the winter months and when heat has been humid. 3. Use saline nose sprays frequently 4. Saline irrigations of the nose can be very helpful if done frequently.  * 4X daily for 1 week*  * Use of a nettie pot can be helpful with this. Follow directions with this* 5. Drink plenty of fluids 6. Keep thermostat turn down low 7.For any cough or congestion- promethazine DM- with sedatuon precautions 8. For fever or aces or pains- take tylenol or ibuprofen appropriate for age and weight.  * for fevers greater than 101 orally you may alternate ibuprofen and tylenol every  3 hours.   Meds ordered this encounter  Medications   nirmatrelvir/ritonavir EUA (PAXLOVID) 20 x 150 MG & 10 x 100MG  TABS    Sig: Take 3 tablets by mouth 2 (two) times daily for 5 days. (Take nirmatrelvir 150 mg two tablets twice daily for 5 days and ritonavir 100 mg one tablet twice daily for 5 days) Patient GFR is 90    Dispense:  30 tablet    Refill:  0    Order Specific Question:   Supervising Provider    Answer:   [[id number]]    promethazine-dextromethorphan (PROMETHAZINE-DM) 6.25-15 MG/5ML syrup    Sig: Take 5 mLs by mouth 4 (four) times daily as needed for cough.    Dispense:  118 mL    Refill:  0    Order Specific Question:   Supervising Provider    Answer:   05-29-1990 [[id number]]       Follow Up Instructions: I discussed the assessment and treatment plan with the patient. The patient was provided an opportunity to ask questions and all were answered. The patient agreed with the plan and demonstrated an understanding of the instructions.  A copy of instructions were sent to the patient via MyChart.  The patient was advised to call back or seek an in-person evaluation if the symptoms worsen or if the condition fails to improve as anticipated.  Time:  I spent 9 minutes with the patient via telehealth technology discussing the above problems/concerns.    Mary-Margaret Eber Hong, FNP

## 2021-12-08 DIAGNOSIS — U071 COVID-19: Secondary | ICD-10-CM | POA: Diagnosis not present

## 2021-12-08 DIAGNOSIS — R059 Cough, unspecified: Secondary | ICD-10-CM | POA: Diagnosis not present

## 2021-12-20 DIAGNOSIS — E785 Hyperlipidemia, unspecified: Secondary | ICD-10-CM | POA: Diagnosis not present

## 2021-12-20 DIAGNOSIS — D649 Anemia, unspecified: Secondary | ICD-10-CM | POA: Diagnosis not present

## 2021-12-20 DIAGNOSIS — K3189 Other diseases of stomach and duodenum: Secondary | ICD-10-CM | POA: Diagnosis not present

## 2021-12-20 DIAGNOSIS — Z9189 Other specified personal risk factors, not elsewhere classified: Secondary | ICD-10-CM | POA: Diagnosis not present

## 2021-12-20 DIAGNOSIS — E538 Deficiency of other specified B group vitamins: Secondary | ICD-10-CM | POA: Diagnosis not present

## 2021-12-20 DIAGNOSIS — Z6834 Body mass index (BMI) 34.0-34.9, adult: Secondary | ICD-10-CM | POA: Diagnosis not present

## 2021-12-20 DIAGNOSIS — E8889 Other specified metabolic disorders: Secondary | ICD-10-CM | POA: Diagnosis not present

## 2021-12-20 DIAGNOSIS — I10 Essential (primary) hypertension: Secondary | ICD-10-CM | POA: Diagnosis not present

## 2021-12-20 DIAGNOSIS — F418 Other specified anxiety disorders: Secondary | ICD-10-CM | POA: Diagnosis not present

## 2021-12-20 DIAGNOSIS — E559 Vitamin D deficiency, unspecified: Secondary | ICD-10-CM | POA: Diagnosis not present

## 2021-12-20 DIAGNOSIS — Z131 Encounter for screening for diabetes mellitus: Secondary | ICD-10-CM | POA: Diagnosis not present

## 2021-12-22 DIAGNOSIS — Z1231 Encounter for screening mammogram for malignant neoplasm of breast: Secondary | ICD-10-CM | POA: Diagnosis not present

## 2021-12-23 DIAGNOSIS — L03213 Periorbital cellulitis: Secondary | ICD-10-CM | POA: Diagnosis not present

## 2021-12-23 DIAGNOSIS — H1031 Unspecified acute conjunctivitis, right eye: Secondary | ICD-10-CM | POA: Diagnosis not present

## 2022-01-03 DIAGNOSIS — G43909 Migraine, unspecified, not intractable, without status migrainosus: Secondary | ICD-10-CM | POA: Diagnosis not present

## 2022-01-03 DIAGNOSIS — E538 Deficiency of other specified B group vitamins: Secondary | ICD-10-CM | POA: Diagnosis not present

## 2022-01-03 DIAGNOSIS — Z9189 Other specified personal risk factors, not elsewhere classified: Secondary | ICD-10-CM | POA: Diagnosis not present

## 2022-01-03 DIAGNOSIS — M2550 Pain in unspecified joint: Secondary | ICD-10-CM | POA: Diagnosis not present

## 2022-01-03 DIAGNOSIS — R55 Syncope and collapse: Secondary | ICD-10-CM | POA: Diagnosis not present

## 2022-01-03 DIAGNOSIS — I10 Essential (primary) hypertension: Secondary | ICD-10-CM | POA: Diagnosis not present

## 2022-01-12 DIAGNOSIS — Z79891 Long term (current) use of opiate analgesic: Secondary | ICD-10-CM | POA: Diagnosis not present

## 2022-01-12 DIAGNOSIS — M5136 Other intervertebral disc degeneration, lumbar region: Secondary | ICD-10-CM | POA: Diagnosis not present

## 2022-01-26 DIAGNOSIS — I10 Essential (primary) hypertension: Secondary | ICD-10-CM | POA: Diagnosis not present

## 2022-01-26 DIAGNOSIS — M2550 Pain in unspecified joint: Secondary | ICD-10-CM | POA: Diagnosis not present

## 2022-01-26 DIAGNOSIS — R55 Syncope and collapse: Secondary | ICD-10-CM | POA: Diagnosis not present

## 2022-01-26 DIAGNOSIS — G43909 Migraine, unspecified, not intractable, without status migrainosus: Secondary | ICD-10-CM | POA: Diagnosis not present

## 2022-01-27 DIAGNOSIS — G90A Postural orthostatic tachycardia syndrome (POTS): Secondary | ICD-10-CM | POA: Diagnosis not present

## 2022-02-02 DIAGNOSIS — G90A Postural orthostatic tachycardia syndrome (POTS): Secondary | ICD-10-CM | POA: Diagnosis not present

## 2022-02-09 DIAGNOSIS — K3189 Other diseases of stomach and duodenum: Secondary | ICD-10-CM | POA: Diagnosis not present

## 2022-02-09 DIAGNOSIS — R55 Syncope and collapse: Secondary | ICD-10-CM | POA: Diagnosis not present

## 2022-02-09 DIAGNOSIS — M2550 Pain in unspecified joint: Secondary | ICD-10-CM | POA: Diagnosis not present

## 2022-02-09 DIAGNOSIS — I10 Essential (primary) hypertension: Secondary | ICD-10-CM | POA: Diagnosis not present

## 2022-03-01 DIAGNOSIS — E669 Obesity, unspecified: Secondary | ICD-10-CM | POA: Diagnosis not present

## 2022-03-01 DIAGNOSIS — K3189 Other diseases of stomach and duodenum: Secondary | ICD-10-CM | POA: Diagnosis not present

## 2022-03-01 DIAGNOSIS — R55 Syncope and collapse: Secondary | ICD-10-CM | POA: Diagnosis not present

## 2022-03-01 DIAGNOSIS — I10 Essential (primary) hypertension: Secondary | ICD-10-CM | POA: Diagnosis not present

## 2022-03-21 DIAGNOSIS — J101 Influenza due to other identified influenza virus with other respiratory manifestations: Secondary | ICD-10-CM | POA: Diagnosis not present

## 2022-03-21 DIAGNOSIS — Z20822 Contact with and (suspected) exposure to covid-19: Secondary | ICD-10-CM | POA: Diagnosis not present

## 2022-03-24 DIAGNOSIS — R059 Cough, unspecified: Secondary | ICD-10-CM | POA: Diagnosis not present

## 2022-03-24 DIAGNOSIS — H01005 Unspecified blepharitis left lower eyelid: Secondary | ICD-10-CM | POA: Diagnosis not present

## 2022-03-24 DIAGNOSIS — J101 Influenza due to other identified influenza virus with other respiratory manifestations: Secondary | ICD-10-CM | POA: Diagnosis not present

## 2022-03-30 DIAGNOSIS — R062 Wheezing: Secondary | ICD-10-CM | POA: Diagnosis not present

## 2022-03-30 DIAGNOSIS — R059 Cough, unspecified: Secondary | ICD-10-CM | POA: Diagnosis not present

## 2022-03-30 DIAGNOSIS — J988 Other specified respiratory disorders: Secondary | ICD-10-CM | POA: Diagnosis not present

## 2022-04-11 DIAGNOSIS — K3189 Other diseases of stomach and duodenum: Secondary | ICD-10-CM | POA: Diagnosis not present

## 2022-04-11 DIAGNOSIS — Z6834 Body mass index (BMI) 34.0-34.9, adult: Secondary | ICD-10-CM | POA: Diagnosis not present

## 2022-04-11 DIAGNOSIS — R55 Syncope and collapse: Secondary | ICD-10-CM | POA: Diagnosis not present

## 2022-04-11 DIAGNOSIS — E669 Obesity, unspecified: Secondary | ICD-10-CM | POA: Diagnosis not present

## 2022-05-01 DIAGNOSIS — Z6834 Body mass index (BMI) 34.0-34.9, adult: Secondary | ICD-10-CM | POA: Diagnosis not present

## 2022-05-01 DIAGNOSIS — K3189 Other diseases of stomach and duodenum: Secondary | ICD-10-CM | POA: Diagnosis not present

## 2022-05-01 DIAGNOSIS — E669 Obesity, unspecified: Secondary | ICD-10-CM | POA: Diagnosis not present

## 2022-05-01 DIAGNOSIS — R55 Syncope and collapse: Secondary | ICD-10-CM | POA: Diagnosis not present

## 2022-05-16 DIAGNOSIS — Z5181 Encounter for therapeutic drug level monitoring: Secondary | ICD-10-CM | POA: Diagnosis not present

## 2022-05-16 DIAGNOSIS — Z79899 Other long term (current) drug therapy: Secondary | ICD-10-CM | POA: Diagnosis not present

## 2022-05-25 DIAGNOSIS — E669 Obesity, unspecified: Secondary | ICD-10-CM | POA: Diagnosis not present

## 2022-05-25 DIAGNOSIS — Z6834 Body mass index (BMI) 34.0-34.9, adult: Secondary | ICD-10-CM | POA: Diagnosis not present

## 2022-05-25 DIAGNOSIS — K3189 Other diseases of stomach and duodenum: Secondary | ICD-10-CM | POA: Diagnosis not present

## 2022-05-25 DIAGNOSIS — H01006 Unspecified blepharitis left eye, unspecified eyelid: Secondary | ICD-10-CM | POA: Diagnosis not present

## 2022-08-17 DIAGNOSIS — M2212 Recurrent subluxation of patella, left knee: Secondary | ICD-10-CM | POA: Diagnosis not present

## 2022-08-22 DIAGNOSIS — H43393 Other vitreous opacities, bilateral: Secondary | ICD-10-CM | POA: Diagnosis not present

## 2022-08-22 DIAGNOSIS — H10413 Chronic giant papillary conjunctivitis, bilateral: Secondary | ICD-10-CM | POA: Diagnosis not present

## 2022-08-22 DIAGNOSIS — H0102A Squamous blepharitis right eye, upper and lower eyelids: Secondary | ICD-10-CM | POA: Diagnosis not present

## 2022-08-22 DIAGNOSIS — H0102B Squamous blepharitis left eye, upper and lower eyelids: Secondary | ICD-10-CM | POA: Diagnosis not present

## 2022-09-06 DIAGNOSIS — Z79891 Long term (current) use of opiate analgesic: Secondary | ICD-10-CM | POA: Diagnosis not present

## 2022-09-06 DIAGNOSIS — M5136 Other intervertebral disc degeneration, lumbar region: Secondary | ICD-10-CM | POA: Diagnosis not present

## 2022-11-17 DIAGNOSIS — M25551 Pain in right hip: Secondary | ICD-10-CM | POA: Diagnosis not present

## 2022-11-17 DIAGNOSIS — M25512 Pain in left shoulder: Secondary | ICD-10-CM | POA: Diagnosis not present

## 2022-11-17 DIAGNOSIS — M25561 Pain in right knee: Secondary | ICD-10-CM | POA: Diagnosis not present

## 2022-11-17 DIAGNOSIS — M25511 Pain in right shoulder: Secondary | ICD-10-CM | POA: Diagnosis not present

## 2022-11-23 DIAGNOSIS — M25561 Pain in right knee: Secondary | ICD-10-CM | POA: Diagnosis not present

## 2022-11-23 DIAGNOSIS — M25551 Pain in right hip: Secondary | ICD-10-CM | POA: Diagnosis not present

## 2022-11-23 DIAGNOSIS — M25511 Pain in right shoulder: Secondary | ICD-10-CM | POA: Diagnosis not present

## 2022-11-23 DIAGNOSIS — M25512 Pain in left shoulder: Secondary | ICD-10-CM | POA: Diagnosis not present

## 2022-11-28 DIAGNOSIS — M25512 Pain in left shoulder: Secondary | ICD-10-CM | POA: Diagnosis not present

## 2022-11-28 DIAGNOSIS — M25511 Pain in right shoulder: Secondary | ICD-10-CM | POA: Diagnosis not present

## 2022-12-05 DIAGNOSIS — M25511 Pain in right shoulder: Secondary | ICD-10-CM | POA: Diagnosis not present

## 2022-12-05 DIAGNOSIS — M25561 Pain in right knee: Secondary | ICD-10-CM | POA: Diagnosis not present

## 2022-12-05 DIAGNOSIS — M25512 Pain in left shoulder: Secondary | ICD-10-CM | POA: Diagnosis not present

## 2022-12-05 DIAGNOSIS — M25551 Pain in right hip: Secondary | ICD-10-CM | POA: Diagnosis not present

## 2022-12-07 DIAGNOSIS — M25512 Pain in left shoulder: Secondary | ICD-10-CM | POA: Diagnosis not present

## 2022-12-07 DIAGNOSIS — M25561 Pain in right knee: Secondary | ICD-10-CM | POA: Diagnosis not present

## 2022-12-07 DIAGNOSIS — M25511 Pain in right shoulder: Secondary | ICD-10-CM | POA: Diagnosis not present

## 2022-12-07 DIAGNOSIS — M25551 Pain in right hip: Secondary | ICD-10-CM | POA: Diagnosis not present

## 2022-12-12 DIAGNOSIS — M25551 Pain in right hip: Secondary | ICD-10-CM | POA: Diagnosis not present

## 2022-12-12 DIAGNOSIS — M25512 Pain in left shoulder: Secondary | ICD-10-CM | POA: Diagnosis not present

## 2022-12-12 DIAGNOSIS — M25511 Pain in right shoulder: Secondary | ICD-10-CM | POA: Diagnosis not present

## 2022-12-12 DIAGNOSIS — M25561 Pain in right knee: Secondary | ICD-10-CM | POA: Diagnosis not present

## 2022-12-19 DIAGNOSIS — M25551 Pain in right hip: Secondary | ICD-10-CM | POA: Diagnosis not present

## 2022-12-19 DIAGNOSIS — M25561 Pain in right knee: Secondary | ICD-10-CM | POA: Diagnosis not present

## 2022-12-19 DIAGNOSIS — M25511 Pain in right shoulder: Secondary | ICD-10-CM | POA: Diagnosis not present

## 2022-12-19 DIAGNOSIS — M25512 Pain in left shoulder: Secondary | ICD-10-CM | POA: Diagnosis not present

## 2022-12-21 DIAGNOSIS — M25561 Pain in right knee: Secondary | ICD-10-CM | POA: Diagnosis not present

## 2022-12-21 DIAGNOSIS — M25511 Pain in right shoulder: Secondary | ICD-10-CM | POA: Diagnosis not present

## 2022-12-21 DIAGNOSIS — M25551 Pain in right hip: Secondary | ICD-10-CM | POA: Diagnosis not present

## 2022-12-21 DIAGNOSIS — M25512 Pain in left shoulder: Secondary | ICD-10-CM | POA: Diagnosis not present

## 2022-12-28 DIAGNOSIS — M79662 Pain in left lower leg: Secondary | ICD-10-CM | POA: Diagnosis not present

## 2022-12-28 DIAGNOSIS — M25562 Pain in left knee: Secondary | ICD-10-CM | POA: Diagnosis not present

## 2022-12-28 DIAGNOSIS — M25561 Pain in right knee: Secondary | ICD-10-CM | POA: Diagnosis not present

## 2022-12-28 DIAGNOSIS — M25551 Pain in right hip: Secondary | ICD-10-CM | POA: Diagnosis not present

## 2023-01-04 DIAGNOSIS — M25551 Pain in right hip: Secondary | ICD-10-CM | POA: Diagnosis not present

## 2023-01-04 DIAGNOSIS — M25511 Pain in right shoulder: Secondary | ICD-10-CM | POA: Diagnosis not present

## 2023-01-04 DIAGNOSIS — M25561 Pain in right knee: Secondary | ICD-10-CM | POA: Diagnosis not present

## 2023-01-04 DIAGNOSIS — M25512 Pain in left shoulder: Secondary | ICD-10-CM | POA: Diagnosis not present

## 2023-01-09 DIAGNOSIS — M5030 Other cervical disc degeneration, unspecified cervical region: Secondary | ICD-10-CM | POA: Diagnosis not present

## 2023-01-09 DIAGNOSIS — Z1231 Encounter for screening mammogram for malignant neoplasm of breast: Secondary | ICD-10-CM | POA: Diagnosis not present

## 2023-01-09 DIAGNOSIS — M51369 Other intervertebral disc degeneration, lumbar region without mention of lumbar back pain or lower extremity pain: Secondary | ICD-10-CM | POA: Diagnosis not present

## 2023-01-09 DIAGNOSIS — Z79891 Long term (current) use of opiate analgesic: Secondary | ICD-10-CM | POA: Diagnosis not present

## 2023-01-18 DIAGNOSIS — M25511 Pain in right shoulder: Secondary | ICD-10-CM | POA: Diagnosis not present

## 2023-01-18 DIAGNOSIS — M25562 Pain in left knee: Secondary | ICD-10-CM | POA: Diagnosis not present

## 2023-01-18 DIAGNOSIS — M25551 Pain in right hip: Secondary | ICD-10-CM | POA: Diagnosis not present

## 2023-01-18 DIAGNOSIS — M25512 Pain in left shoulder: Secondary | ICD-10-CM | POA: Diagnosis not present

## 2023-01-22 DIAGNOSIS — M79671 Pain in right foot: Secondary | ICD-10-CM | POA: Diagnosis not present

## 2023-01-23 DIAGNOSIS — M25511 Pain in right shoulder: Secondary | ICD-10-CM | POA: Diagnosis not present

## 2023-01-23 DIAGNOSIS — M25551 Pain in right hip: Secondary | ICD-10-CM | POA: Diagnosis not present

## 2023-01-23 DIAGNOSIS — M25562 Pain in left knee: Secondary | ICD-10-CM | POA: Diagnosis not present

## 2023-01-23 DIAGNOSIS — M25512 Pain in left shoulder: Secondary | ICD-10-CM | POA: Diagnosis not present

## 2023-01-30 DIAGNOSIS — M25551 Pain in right hip: Secondary | ICD-10-CM | POA: Diagnosis not present

## 2023-01-30 DIAGNOSIS — M25511 Pain in right shoulder: Secondary | ICD-10-CM | POA: Diagnosis not present

## 2023-01-30 DIAGNOSIS — M25562 Pain in left knee: Secondary | ICD-10-CM | POA: Diagnosis not present

## 2023-01-30 DIAGNOSIS — M25512 Pain in left shoulder: Secondary | ICD-10-CM | POA: Diagnosis not present

## 2023-02-07 DIAGNOSIS — M25511 Pain in right shoulder: Secondary | ICD-10-CM | POA: Diagnosis not present

## 2023-02-07 DIAGNOSIS — M25512 Pain in left shoulder: Secondary | ICD-10-CM | POA: Diagnosis not present

## 2023-02-07 DIAGNOSIS — M25562 Pain in left knee: Secondary | ICD-10-CM | POA: Diagnosis not present

## 2023-02-07 DIAGNOSIS — M25551 Pain in right hip: Secondary | ICD-10-CM | POA: Diagnosis not present

## 2023-05-15 DIAGNOSIS — Z79891 Long term (current) use of opiate analgesic: Secondary | ICD-10-CM | POA: Diagnosis not present

## 2023-05-15 DIAGNOSIS — M79671 Pain in right foot: Secondary | ICD-10-CM | POA: Diagnosis not present

## 2023-05-15 DIAGNOSIS — M5030 Other cervical disc degeneration, unspecified cervical region: Secondary | ICD-10-CM | POA: Diagnosis not present

## 2023-05-15 DIAGNOSIS — Z79899 Other long term (current) drug therapy: Secondary | ICD-10-CM | POA: Diagnosis not present

## 2023-05-15 DIAGNOSIS — M51369 Other intervertebral disc degeneration, lumbar region without mention of lumbar back pain or lower extremity pain: Secondary | ICD-10-CM | POA: Diagnosis not present

## 2023-05-15 DIAGNOSIS — Z5181 Encounter for therapeutic drug level monitoring: Secondary | ICD-10-CM | POA: Diagnosis not present

## 2023-05-24 DIAGNOSIS — E538 Deficiency of other specified B group vitamins: Secondary | ICD-10-CM | POA: Diagnosis not present

## 2023-05-24 DIAGNOSIS — I10 Essential (primary) hypertension: Secondary | ICD-10-CM | POA: Diagnosis not present

## 2023-05-24 DIAGNOSIS — K219 Gastro-esophageal reflux disease without esophagitis: Secondary | ICD-10-CM | POA: Diagnosis not present

## 2023-05-24 DIAGNOSIS — E785 Hyperlipidemia, unspecified: Secondary | ICD-10-CM | POA: Diagnosis not present

## 2023-05-24 DIAGNOSIS — E559 Vitamin D deficiency, unspecified: Secondary | ICD-10-CM | POA: Diagnosis not present

## 2023-06-06 DIAGNOSIS — M25551 Pain in right hip: Secondary | ICD-10-CM | POA: Diagnosis not present

## 2023-06-06 DIAGNOSIS — Q796 Ehlers-Danlos syndrome, unspecified: Secondary | ICD-10-CM | POA: Diagnosis not present

## 2023-06-06 DIAGNOSIS — M25552 Pain in left hip: Secondary | ICD-10-CM | POA: Diagnosis not present

## 2023-06-06 DIAGNOSIS — M25512 Pain in left shoulder: Secondary | ICD-10-CM | POA: Diagnosis not present

## 2023-06-14 DIAGNOSIS — M25551 Pain in right hip: Secondary | ICD-10-CM | POA: Diagnosis not present

## 2023-06-14 DIAGNOSIS — M25512 Pain in left shoulder: Secondary | ICD-10-CM | POA: Diagnosis not present

## 2023-06-14 DIAGNOSIS — M25552 Pain in left hip: Secondary | ICD-10-CM | POA: Diagnosis not present

## 2023-06-14 DIAGNOSIS — Q796 Ehlers-Danlos syndrome, unspecified: Secondary | ICD-10-CM | POA: Diagnosis not present

## 2023-06-18 DIAGNOSIS — M25512 Pain in left shoulder: Secondary | ICD-10-CM | POA: Diagnosis not present

## 2023-06-18 DIAGNOSIS — Q796 Ehlers-Danlos syndrome, unspecified: Secondary | ICD-10-CM | POA: Diagnosis not present

## 2023-06-18 DIAGNOSIS — M25551 Pain in right hip: Secondary | ICD-10-CM | POA: Diagnosis not present

## 2023-06-18 DIAGNOSIS — M25552 Pain in left hip: Secondary | ICD-10-CM | POA: Diagnosis not present

## 2023-06-26 DIAGNOSIS — M25551 Pain in right hip: Secondary | ICD-10-CM | POA: Diagnosis not present

## 2023-06-26 DIAGNOSIS — M25511 Pain in right shoulder: Secondary | ICD-10-CM | POA: Diagnosis not present

## 2023-06-26 DIAGNOSIS — M25512 Pain in left shoulder: Secondary | ICD-10-CM | POA: Diagnosis not present

## 2023-06-26 DIAGNOSIS — Q796 Ehlers-Danlos syndrome, unspecified: Secondary | ICD-10-CM | POA: Diagnosis not present

## 2023-07-19 DIAGNOSIS — M25562 Pain in left knee: Secondary | ICD-10-CM | POA: Diagnosis not present

## 2023-08-02 DIAGNOSIS — M25551 Pain in right hip: Secondary | ICD-10-CM | POA: Diagnosis not present

## 2023-08-02 DIAGNOSIS — M25511 Pain in right shoulder: Secondary | ICD-10-CM | POA: Diagnosis not present

## 2023-08-02 DIAGNOSIS — Q796 Ehlers-Danlos syndrome, unspecified: Secondary | ICD-10-CM | POA: Diagnosis not present

## 2023-08-02 DIAGNOSIS — M25552 Pain in left hip: Secondary | ICD-10-CM | POA: Diagnosis not present

## 2023-08-04 DIAGNOSIS — J208 Acute bronchitis due to other specified organisms: Secondary | ICD-10-CM | POA: Diagnosis not present

## 2023-08-04 DIAGNOSIS — R051 Acute cough: Secondary | ICD-10-CM | POA: Diagnosis not present

## 2023-08-04 DIAGNOSIS — R0981 Nasal congestion: Secondary | ICD-10-CM | POA: Diagnosis not present

## 2023-08-07 DIAGNOSIS — R0989 Other specified symptoms and signs involving the circulatory and respiratory systems: Secondary | ICD-10-CM | POA: Diagnosis not present

## 2023-08-07 DIAGNOSIS — G43909 Migraine, unspecified, not intractable, without status migrainosus: Secondary | ICD-10-CM | POA: Diagnosis not present

## 2023-08-07 DIAGNOSIS — G43109 Migraine with aura, not intractable, without status migrainosus: Secondary | ICD-10-CM | POA: Diagnosis not present

## 2023-08-30 DIAGNOSIS — M25562 Pain in left knee: Secondary | ICD-10-CM | POA: Diagnosis not present

## 2023-08-30 DIAGNOSIS — H43393 Other vitreous opacities, bilateral: Secondary | ICD-10-CM | POA: Diagnosis not present

## 2023-08-30 DIAGNOSIS — H0102A Squamous blepharitis right eye, upper and lower eyelids: Secondary | ICD-10-CM | POA: Diagnosis not present

## 2023-08-30 DIAGNOSIS — H10413 Chronic giant papillary conjunctivitis, bilateral: Secondary | ICD-10-CM | POA: Diagnosis not present

## 2023-08-30 DIAGNOSIS — H2513 Age-related nuclear cataract, bilateral: Secondary | ICD-10-CM | POA: Diagnosis not present

## 2023-09-12 DIAGNOSIS — M51369 Other intervertebral disc degeneration, lumbar region without mention of lumbar back pain or lower extremity pain: Secondary | ICD-10-CM | POA: Diagnosis not present

## 2023-10-20 DIAGNOSIS — M5416 Radiculopathy, lumbar region: Secondary | ICD-10-CM | POA: Diagnosis not present

## 2023-11-13 DIAGNOSIS — M51369 Other intervertebral disc degeneration, lumbar region without mention of lumbar back pain or lower extremity pain: Secondary | ICD-10-CM | POA: Diagnosis not present

## 2023-12-25 ENCOUNTER — Ambulatory Visit: Admitting: Neurology

## 2023-12-26 ENCOUNTER — Ambulatory Visit: Admitting: Neurology

## 2023-12-26 ENCOUNTER — Encounter: Payer: Self-pay | Admitting: Neurology

## 2023-12-26 VITALS — BP 168/107 | HR 70 | Ht 70.0 in | Wt 260.2 lb

## 2023-12-26 DIAGNOSIS — Z789 Other specified health status: Secondary | ICD-10-CM | POA: Diagnosis not present

## 2023-12-26 DIAGNOSIS — R519 Headache, unspecified: Secondary | ICD-10-CM

## 2023-12-26 DIAGNOSIS — R0683 Snoring: Secondary | ICD-10-CM

## 2023-12-26 DIAGNOSIS — Z9189 Other specified personal risk factors, not elsewhere classified: Secondary | ICD-10-CM | POA: Diagnosis not present

## 2023-12-26 DIAGNOSIS — E669 Obesity, unspecified: Secondary | ICD-10-CM

## 2023-12-26 DIAGNOSIS — G43E19 Chronic migraine with aura, intractable, without status migrainosus: Secondary | ICD-10-CM

## 2023-12-26 DIAGNOSIS — G43109 Migraine with aura, not intractable, without status migrainosus: Secondary | ICD-10-CM

## 2023-12-26 DIAGNOSIS — G4719 Other hypersomnia: Secondary | ICD-10-CM

## 2023-12-26 NOTE — Progress Notes (Signed)
 Subjective:    Patient ID: Angela Harrison is a 48 y.o. female.  HPI    True Mar, MD, PhD [facility] [address] [address] [place], [place] [address]  Dear Anthony,  I saw your patient, Angela Harrison, upon your kind request in my neurologic clinic today for evaluation of her recurrent headaches.  The patient is unaccompanied today.  As you know, [name] is a 48 year old female with an underlying complex medical history of vitamin D  deficiency, anemia, arthritis, with status post bilateral hip replacements, Ehlers-Danlos syndrome, reflux disease, gastroparesis, POTS, syncope, sinusitis, hypertension, orthostatic hypotension, and obesity, who reports a longstanding history of migraine headaches.  She has tried multiple medications and is currently on sumatriptan  injections which worked well for her but she tends to have an injection site reaction with redness.  She feels like she needs to take an injection about 5 or 6 times a month.  She has increased migraines right before her menstrual cycle starts and during the first day of her menstrual cycle.  She is followed by [facility] pain management for her chronic pain and is on oxycodone , and a muscle relaxer, Zanaflex .  She is on multiple medications currently including Norvasc  which has been increased to 1-1/2 pills recently, she takes Phenergan  as needed, she is on sertraline .  She is no longer on Emgality injections.  She took it for about 3 months but was not sure if it helped and insurance stopped covering it.  She could not get a prescription for [place] which actually worked well in the past for her.  In May 2025 she had an episode of visual aura without headache, this lasted about 1 hour.  She is typically up-to-date with her eye examination and goes to [facility] eye care.  She drinks quite a bit of caffeine in the form of coffee in the morning, 1 to 2 cups and diet soda, about 4 cans/day.  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  I reviewed your office note from 08/07/2023.  She has been on sumatriptan , she has been on the injections subcutaneously as needed. I also reviewed her office visit note with Dr. Kip from 05/24/2023.  She had blood work through your office on 05/24/2023 and I reviewed test results in her paper chart.  TSH was 1.96, lipid panel showed total cholesterol of 199, LDL mildly elevated at 118, triglycerides 130, iron studies showed total iron binding capacity of 349, iron saturation below normal at 9%, total iron below normal at 32.  Vitamin D  level was low normal at 32.9.  CMP showed benign findings.  CBC with differential and platelets showed MCV below normal at 80.4, MCH below normal at 26.8.  Vitamin B12 low normal at 233, magnesium  normal at 2.0.  She had previously seen Dr. Wanda Schiller at [facility] for chronic migraines.  She was last seen at [facility] neurology by Jhonny Ahle, NP on 06/02/2019.  I reviewed the office visit note.  She has been on [place] in the past.  She previously had tried Zomig and Maxalt as well.  She has been on Reglan  and Phenergan , Zofran  and Compazine  for nausea as needed.  She has tried Topamax and gabapentin and has tried several muscle relaxants including Flexeril , Robaxin , Skelaxin.  She has been on sertraline  in the past and has also undergone trigger point injections.  She had also been  to the headache wellness center in the past.  She has been on Botox injections as well.  She had a head CT in the distant past.  She had a noncontrasted head CT on 02/20/2011 and I reviewed the report in her chart:  Impression: No acute intracranial abnormality.  She has not had a brain MRI.  She has not had a sleep study.  She reports snoring and daytime somnolence.  Her Epworth sleepiness score is 11 out of 24, fatigue severity score is 44 out of 63.   She has been referred to [facility] wellness for weight management.  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   Ehlers-Danlos syndrome type III    Gastric paresis    Migraine    migraines   Syncope    neurocardiogenic takes toprol  and zoloft  for   Vitamin D  deficiency 12/16/2016    Her Past Surgical History Is Significant For: Past Surgical History:  Procedure Laterality Date   BIOPSY  10/06/2019   Procedure: BIOPSY;  Surgeon: Donnald Charleston, MD;  Location: [facility] ENDOSCOPY;  Service: Endoscopy;;   CHOLECYSTECTOMY     ESOPHAGOGASTRODUODENOSCOPY (EGD) WITH PROPOFOL  N/A 10/06/2019   Procedure: ESOPHAGOGASTRODUODENOSCOPY (EGD) WITH PROPOFOL ;  Surgeon: Donnald Charleston, MD;  Location[facility] ENDOSCOPY;  Service: Endoscopy;  Laterality: N/A;   ESOPHAGOGASTRODUODENOSCOPY ENDOSCOPY     x 2 or 3   FIBULA FRACTURE SURGERY Left 12/14/2016   HARDWARE REMOVAL Left 12/14/2016   Procedure: HARDWARE REMOVAL LEFT TIBIA;  Surgeon: Celena Sharper, MD;  Location: [facility] OR;  Service: Orthopedics;  Laterality: Left;   IM NAILING TIBIA Left 12/14/2016   KNEE SURGERY Bilateral    2 on each knee   MAXILLARY ANTROSTOMY     ORIF ANKLE FRACTURE Left 04/12/2016   Procedure: OPEN REDUCTION INTERNAL FIXATION (ORIF) ANKLE LEFT;  Surgeon: Selinda Belvie Gosling, MD;  Location: [facility] OR;  Service: Orthopedics;  Laterality: Left;   ORIF FIBULA FRACTURE Left 12/14/2016   Procedure: NON-UNION REPAIR LEFT FIBULA FRACTURE;  Surgeon: Celena Sharper, MD;  Location: [facility] OR;  Service: Orthopedics;  Laterality: Left;   SHOULDER SURGERY Left    x 4   TIBIA HARDWARE REMOVAL Left 12/14/2016   TIBIA IM NAIL INSERTION Left 04/12/2016   Procedure: INTRAMEDULLARY (IM) NAIL TIBIAL LEFT;  Surgeon: Selinda Belvie Gosling, MD;  Location: [facility] OR;  Service: Orthopedics;  Laterality: Left;   TIBIA IM NAIL INSERTION  Left 12/14/2016   Procedure: INTRAMEDULLARY (IM) NAIL LEFT TIBIAL;  Surgeon: Celena Sharper, MD;  Location: [facility] OR;  Service: Orthopedics;  Laterality: Left;   TONSILLECTOMY     adenoids also   TOTAL HIP ARTHROPLASTY Left 08/24/2015   Procedure: LEFT TOTAL HIP ARTHROPLASTY ANTERIOR APPROACH;  Surgeon: Donnice Car, MD;  Location: [facility] ORS;  Service: Orthopedics;  Laterality: Left;   TOTAL HIP ARTHROPLASTY Right 09/21/2017   Procedure: RIGHT TOTAL HIP ARTHROPLASTY ANTERIOR APPROACH;  Surgeon: Vernetta Lonni GRADE, MD;  Location: [facility] ORS;  Service: Orthopedics;  Laterality: Right;    Her Family History Is Significant For: Family History  Problem Relation Age of Onset   Breast cancer Mother    Migraines Mother    Hypertension Father    Migraines Father    Migraines Brother    Migraines Other     Her Social History Is Significant  For: Social History   Socioeconomic History   Marital status: Married    Spouse name: Not on file   Number of children: Not on file   Years of education: Not on file   Highest education level: Not on file  Occupational History   Not on file  Tobacco Use   Smoking status: Never   Smokeless tobacco: Never  Vaping Use   Vaping status: Never Used  Substance and Sexual Activity   Alcohol use: Yes    Comment: occ   Drug use: No   Sexual activity: Not on file  Other Topics Concern   Not on file  Social History Narrative   Pt lives with husband    Pt works    Social Drivers of Corporate investment banker Strain: Not on file  Food Insecurity: Not on file  Transportation Needs: Not on file  Physical Activity: Not on file  Stress: Not on file  Social Connections: Unknown (08/01/2021)   Received from [employer] [employer]   Social Network    Social Network: Not on file    Her Allergies Are:  Allergies  Allergen Reactions   Cephalosporins Hives, Swelling, Rash and Other (See Comments)    Can tolerate Penicillin   Cefazolin  Hives, Swelling, Rash and Other  (See Comments)    Can tolerate Penicillin    Cephalexin Hives, Swelling, Rash and Other (See Comments)    Can tolerate Penicillin    Diclofenac Sodium Rash  :   Her Current Medications Are:  Outpatient Encounter Medications as of 12/26/2023  Medication Sig   amLODipine  (NORVASC ) 5 MG tablet Take 1 tablet (5 mg total) by mouth daily. (Patient taking differently: Take 7.5 mg by mouth daily.)   ferrous sulfate  325 (65 FE) MG tablet Take 1 tablet (325 mg total) by mouth daily with breakfast.   folic acid  (FOLVITE ) 1 MG tablet Take 1 tablet (1 mg total) by mouth daily.   metoprolol  succinate (TOPROL -XL) 50 MG 24 hr tablet Take 50 mg by mouth at bedtime.    ondansetron  (ZOFRAN  ODT) 4 MG disintegrating tablet Take 1 tablet (4 mg total) by mouth every 6 (six) hours as needed for nausea or vomiting. (Patient taking differently: Take 4 mg by mouth as needed for nausea or vomiting.)   Oxycodone  HCl 10 MG TABS Take 1 tab PO 5 times per day PRN pain   pantoprazole  (PROTONIX ) 40 MG tablet Take 1 tablet (40 mg total) by mouth daily.   promethazine  (PHENERGAN ) 25 MG suppository Place 25 mg rectally every 6 (six) hours as needed for nausea or vomiting.    promethazine -dextromethorphan (PROMETHAZINE -DM) 6.25-15 MG/5ML syrup Take 5 mLs by mouth 4 (four) times daily as needed for cough. (Patient taking differently: Take 5 mLs by mouth as needed for cough.)   sertraline  (ZOLOFT ) 50 MG tablet Take 50 mg by mouth at bedtime.    SUMAtriptan  (IMITREX ) 6 MG/0.5ML SOLN injection Inject 6 mg into the skin every 2 (two) hours as needed for migraine or headache. May repeat in 2 hours if headache persists or recurs.   tiZANidine  (ZANAFLEX ) 4 MG tablet Take 4 mg by mouth every 6 (six) hours as needed for muscle spasms.   diclofenac (VOLTAREN) 75 MG EC tablet diclofenac sodium 75 mg tablet,delayed release  Take 1 tablet twice a day by oral route.   Galcanezumab-gnlm (EMGALITY) 120 MG/ML SOAJ 120 mg every 30 (thirty) days.  (Patient not taking: Reported on 10/05/2020)   HYDROcodone -acetaminophen  (NORCO) 10-325 MG tablet  Take 1 tablet by mouth every 6 (six) hours as needed for moderate pain.   meclizine  (ANTIVERT ) 50 MG tablet Take by mouth.   Ubrogepant 100 MG TABS Take by mouth. (Patient not taking: Reported on 12/26/2023)   No facility-administered encounter medications on file as of 12/26/2023.  :   Review of Systems:  Out of a complete 14 point review of systems, all are reviewed and negative with the exception of these symptoms as listed below:  Review of Systems  Neurological:        Pt here for Migraines Pt states 10 migraines in last month    ESS:11 FSS:44     Objective:  Neurological Exam  Physical Exam  Physical Examination:   Vitals:   12/26/23 1403  BP: (!) 168/107  Pulse: 70    General Examination: The patient is a very pleasant 48 y.o. female in no acute distress. She appears well-developed and well-nourished and well groomed.   HEENT: Normocephalic, atraumatic, pupils are equal, round and reactive to light, funduscopic exam benign.  Tracking well-preserved.  Corrective eyeglasses in place.  Extraocular tracking is good without limitation to gaze excursion or nystagmus noted. Hearing is grossly intact. Face is symmetric with normal facial animation. Speech is clear with no dysarthria noted. There is no hypophonia. There is no lip, neck/head, jaw or voice tremor. Neck is supple with full range of passive and active motion. There are no carotid bruits on auscultation. Oropharynx exam reveals: mild mouth dryness, moderate airway crowding.  Tongue protrudes centrally and palate elevates symmetrically.  Neck circumference is 15 7/8 inches.  Airway examination reveals moderate airway crowding with Mallampati class III, tonsils absent, small airway entry.  Tongue protrudes centrally and palate elevates symmetrically.   Chest: Clear to auscultation without wheezing, rhonchi or crackles  noted.  Heart: S1+S2+0, regular and normal without murmurs, rubs or gallops noted.   Abdomen: Soft, non-tender and non-distended.  Extremities: There is puffiness in the distal lower extremities bilaterally, significantly discolored feet bilaterally with redness noted in the distal lower extremities bilaterally.   Skin: Warm and dry without trophic changes noted.   Musculoskeletal: exam reveals no obvious joint deformities, status post bilateral hip replacements, unremarkable scar left ankle and left knee area from prior surgery..   Neurologically:  Mental status: The patient is awake, alert and oriented in all 4 spheres. Her immediate and remote memory, attention, language skills and fund of knowledge are appropriate. There is no evidence of aphasia, agnosia, apraxia or anomia. Speech is clear with normal prosody and enunciation. Thought process is linear. Mood is normal and affect is normal.  Cranial nerves II - XII are as described above under HEENT exam.  Motor exam: Normal bulk, moving all 4 extremities without obvious restriction, strength and tone is noted. There is no obvious action or resting tremor.  Fine motor skills and coordination: grossly intact.  Cerebellar testing: No dysmetria or intention tremor. There is no truncal or gait ataxia.  Normal finger-to-nose. Reflexes 1+ in the upper extremities and 1-2+ in both knees, absent in both ankles.  Toes are downgoing bilaterally. Sensory exam: intact to light touch in the upper and lower extremities.  Gait, station and balance: She stands with mild difficulty, she walks without a limp.   Assessment and Plan:  In summary, MONYA KOZAKIEWICZ is a very pleasant 48 y.o.-year old female with an underlying complex medical history of vitamin D  deficiency, anemia, arthritis, with status post bilateral hip replacements, Ehlers-Danlos syndrome, reflux  disease, gastroparesis, POTS, syncope, sinusitis, hypertension, orthostatic hypotension, and  obesity, who presents for evaluation of her chronic headaches of many years duration.  She has a history of migraine headaches, also experienced a migraine aura a few months ago without an actual headache.  She is up-to-date with her eye examination.  She has tried and failed multiple preventative and abortive medications for migraine headaches and has seen other providers for headache management in the past.  This was an extended visit of over 60 minutes with copious record review involved in considerable counseling and coordination of care, addressing multiple issues.   Below is a summary of my recommendations and our discussion points from today's visit, based on chart review, history and examination. They were given these instructions verbally during the visit in detail and also in writing in the MyChart after visit summary (AVS), which they can access electronically.   << Please remember, common headache triggers are: sleep deprivation, dehydration, overheating, stress, hypoglycemia or skipping meals and blood sugar fluctuations, excessive pain medications or excessive alcohol use or caffeine withdrawal. Some people have food triggers such as aged cheese, orange juice or chocolate, especially dark chocolate, or MSG (monosodium glutamate). Try to avoid these headache triggers as much possible. It may be helpful to keep a headache diary to figure out what makes your headaches worse or brings them on and what alleviates them. Some people report headache onset after exercise but studies have shown that regular exercise may actually prevent headaches from coming. If you have exercise-induced headaches, please make sure that you drink plenty of fluid before and after exercising and that you do not over do it and do not overheat. Please reduce your daily caffeine intake and limit yourself to 1 or 2 servings per day, as caffeine can drive headaches.  We will do a brain scan, called MRI and call you with the test  results. We will have to schedule you for this on a separate date. This test requires authorization from your insurance, and we will take care of the insurance process. I will order a sleep study to look for signs of obstructive sleep apnea (aka OSA). As explained, the long-term risks and ramifications of untreated moderate to severe obstructive sleep apnea may include (but are not limited to): increased risk for cardiovascular disease, including congestive heart failure, stroke, difficult to control hypertension, treatment resistant obesity, arrhythmias, especially irregular heartbeat commonly known as A. Fib. (atrial fibrillation); even type 2 diabetes has been linked to untreated OSA.  Please continue to stay up-to-date with your regular eye examination. For headache prevention, we may consider Emgality or Ajovy or Aimovig injections in the near future.  For now, we will restart Ubrelvy, 100 mg strength: Take 1 pill at onset of migraine headache, may repeat in 2 hours, no more than 2 pills in 24 hours. May cause sedation and nausea.  Please remember, you cannot combine Ubrelvy with the Imitrex  within 24 hours of each other but you can take 1 or the other for an acute migraine.You can continue with the Imitrex  injections through your PCP for now. We will plan a follow up after testing.  We will keep you posted as to your test results by phone call in the interim.  >>    Thank you very much for allowing me to participate in the care of this nice patient. If I can be of any further assistance to you please do not hesitate to call me at [phone].  Sincerely,  True Mar, MD, PhD

## 2023-12-26 NOTE — Patient Instructions (Addendum)
 It was nice to meet you today.   As discussed, your headaches are likely due to a combination of factors.  These factors include excessive caffeine intake, chronic pain with possible sleep deprivation and underlying sleep apnea not excluded.  Here is what we discussed today and my recommendations for you:   Please remember, common headache triggers are: sleep deprivation, dehydration, overheating, stress, hypoglycemia or skipping meals and blood sugar fluctuations, excessive pain medications or excessive alcohol use or caffeine withdrawal. Some people have food triggers such as aged cheese, orange juice or chocolate, especially dark chocolate, or MSG (monosodium glutamate). Try to avoid these headache triggers as much possible. It may be helpful to keep a headache diary to figure out what makes your headaches worse or brings them on and what alleviates them. Some people report headache onset after exercise but studies have shown that regular exercise may actually prevent headaches from coming. If you have exercise-induced headaches, please make sure that you drink plenty of fluid before and after exercising and that you do not over do it and do not overheat. Please reduce your daily caffeine intake and limit yourself to 1 or 2 servings per day, as caffeine can drive headaches.  We will do a brain scan, called MRI and call you with the test results. We will have to schedule you for this on a separate date. This test requires authorization from your insurance, and we will take care of the insurance process. I will order a sleep study to look for signs of obstructive sleep apnea (aka OSA). As explained, the long-term risks and ramifications of untreated moderate to severe obstructive sleep apnea may include (but are not limited to): increased risk for cardiovascular disease, including congestive heart failure, stroke, difficult to control hypertension, treatment resistant obesity, arrhythmias, especially  irregular heartbeat commonly known as A. Fib. (atrial fibrillation); even type 2 diabetes has been linked to untreated OSA.  Please continue to stay up-to-date with your regular eye examination. For headache prevention, we may consider Emgality or Ajovy or Aimovig injections in the near future.  For now, we will restart Ubrelvy, 100 mg strength: Take 1 pill at onset of migraine headache, may repeat in 2 hours, no more than 2 pills in 24 hours. May cause sedation and nausea.  Please remember, you cannot combine Ubrelvy with the Imitrex  within 24 hours of each other but you can take 1 or the other for an acute migraine.  You can continue with the Imitrex  injections through your PCP for now. We will plan a follow up after testing.  We will keep you posted as to your test results by phone call in the interim.

## 2024-01-21 ENCOUNTER — Telehealth: Payer: Self-pay | Admitting: Neurology

## 2024-01-21 NOTE — Telephone Encounter (Signed)
 LVM for pt to call back to schedule  [employer] no auth req spoke to Zia B ref # [id number]

## 2024-01-23 ENCOUNTER — Telehealth: Payer: Self-pay | Admitting: Neurology

## 2024-01-23 NOTE — Telephone Encounter (Signed)
 10/6 & 10/2 LVM for her to call me back to schedule the MRI and haven't heard back.

## 2024-01-24 DIAGNOSIS — Z1231 Encounter for screening mammogram for malignant neoplasm of breast: Secondary | ICD-10-CM | POA: Diagnosis not present

## 2024-02-28 DIAGNOSIS — M51362 Other intervertebral disc degeneration, lumbar region with discogenic back pain and lower extremity pain: Secondary | ICD-10-CM | POA: Diagnosis not present

## 2024-02-28 DIAGNOSIS — G8929 Other chronic pain: Secondary | ICD-10-CM | POA: Diagnosis not present

## 2024-03-07 DIAGNOSIS — Q796 Ehlers-Danlos syndrome, unspecified: Secondary | ICD-10-CM | POA: Diagnosis not present

## 2024-03-07 DIAGNOSIS — G90A Postural orthostatic tachycardia syndrome (POTS): Secondary | ICD-10-CM | POA: Diagnosis not present

## 2024-03-07 DIAGNOSIS — R5383 Other fatigue: Secondary | ICD-10-CM | POA: Diagnosis not present

## 2024-03-07 DIAGNOSIS — I10 Essential (primary) hypertension: Secondary | ICD-10-CM | POA: Diagnosis not present
